# Patient Record
Sex: Male | Born: 1975
Health system: Southern US, Community
[De-identification: ages and names within clinical notes are randomized; demographics above are authoritative.]

## PROBLEM LIST (undated history)

## (undated) DIAGNOSIS — E119 Type 2 diabetes mellitus without complications: Secondary | ICD-10-CM

## (undated) DIAGNOSIS — N200 Calculus of kidney: Secondary | ICD-10-CM

## (undated) DIAGNOSIS — I10 Essential (primary) hypertension: Secondary | ICD-10-CM

## (undated) DIAGNOSIS — N179 Acute kidney failure, unspecified: Secondary | ICD-10-CM

## (undated) HISTORY — DX: Type 2 diabetes mellitus without complications: E11.9

## (undated) HISTORY — PX: WISDOM TOOTH EXTRACTION: SHX21

## (undated) HISTORY — DX: Acute kidney failure, unspecified: N17.9

---

## 2004-03-24 ENCOUNTER — Emergency Department (HOSPITAL_COMMUNITY): Admission: EM | Admit: 2004-03-24 | Discharge: 2004-03-24 | Payer: Self-pay | Admitting: Emergency Medicine

## 2010-02-25 ENCOUNTER — Emergency Department: Payer: Self-pay | Admitting: Emergency Medicine

## 2011-08-11 ENCOUNTER — Emergency Department: Payer: Self-pay | Admitting: Unknown Physician Specialty

## 2011-08-11 LAB — URINALYSIS, COMPLETE
Bilirubin,UR: NEGATIVE
Leukocyte Esterase: NEGATIVE
Nitrite: NEGATIVE
Ph: 7 (ref 4.5–8.0)
Protein: 30
WBC UR: 1 /HPF (ref 0–5)

## 2011-08-12 LAB — COMPREHENSIVE METABOLIC PANEL
Albumin: 4.3 g/dL (ref 3.4–5.0)
Alkaline Phosphatase: 68 U/L (ref 50–136)
Bilirubin,Total: 0.4 mg/dL (ref 0.2–1.0)
Creatinine: 1.06 mg/dL (ref 0.60–1.30)
Glucose: 102 mg/dL — ABNORMAL HIGH (ref 65–99)
Osmolality: 284 (ref 275–301)
Sodium: 142 mmol/L (ref 136–145)

## 2011-08-12 LAB — CBC
HCT: 40 % (ref 40.0–52.0)
HGB: 12.8 g/dL — ABNORMAL LOW (ref 13.0–18.0)
Platelet: 244 10*3/uL (ref 150–440)
RBC: 4.51 10*6/uL (ref 4.40–5.90)
RDW: 14.5 % (ref 11.5–14.5)

## 2012-01-14 ENCOUNTER — Emergency Department: Payer: Self-pay | Admitting: Emergency Medicine

## 2014-05-03 ENCOUNTER — Emergency Department: Payer: Self-pay | Admitting: Emergency Medicine

## 2016-05-03 ENCOUNTER — Emergency Department (HOSPITAL_BASED_OUTPATIENT_CLINIC_OR_DEPARTMENT_OTHER)
Admission: EM | Admit: 2016-05-03 | Discharge: 2016-05-04 | Disposition: A | Discharge status: Home / Self Care | Payer: Self-pay | Attending: Emergency Medicine | Admitting: Emergency Medicine

## 2016-05-03 ENCOUNTER — Encounter (HOSPITAL_BASED_OUTPATIENT_CLINIC_OR_DEPARTMENT_OTHER): Payer: Self-pay | Admitting: Emergency Medicine

## 2016-05-03 DIAGNOSIS — I10 Essential (primary) hypertension: Secondary | ICD-10-CM | POA: Insufficient documentation

## 2016-05-03 HISTORY — DX: Essential (primary) hypertension: I10

## 2016-05-03 NOTE — ED Triage Notes (Signed)
Patient started to have frontal sinus region pain. Chills and cold sweats at home. Family chose to take his BP and it was elevated so his Girlfriend gave him one of her Benicars at home. Patient now feels dizzy and light. Ibprophen, nighttime sinus medication

## 2016-05-03 NOTE — ED Provider Notes (Signed)
[facility] DE[facility] Provider Note   CSN: [id number] Arrival date & time: 05/03/16  2153  By signing my name below, I, Margit Banda, attest that this documentation has been prepared under the direction and in the presence of Veryl Abril, PA-C.  Electronically Signed: Margit Banda, ED Scribe. 05/04/16. 12:13 AM.   History   Chief Complaint Chief Complaint  Patient presents with  . Headache    HPI Jason Bruce is a 41 y.o. male who presents to the Emergency Department complaining of a moderate, bilateral frontal HA since yesterday (05/02/16). Associated sx include lightheadedness and nausea. Pt reports he has had HA's off and on for the last year, especially with stress. Headaches are frontal, bilateral, nonradiating, moderate to severe, and do not seem to be associated with other symptoms, such as nasal congestion.  He has taken Ibuprofen and Advil cold and sinus with no relief. PTA, his girlfriend gave him one of her Benicar (25mg ), which he states seemed to help because he is now pain and complaint free. Bp was taken again in the room at 12:08 AM and has lowered to 175/110. Pt denies visual disturbance, neuro deficit, vomiting, chest pain, shortness of breath, peripheral edema, or any other complaints.  On 12/23/14, pt was seen at [facility] for elevated BP and was told to follow up with a PCP, which he did not do.     The history is provided by the patient, medical records and the spouse. No language interpreter was used.    Past Medical History:  Diagnosis Date  . Hypertension     There are no active problems to display for this patient.   History reviewed. No pertinent surgical history.     Home Medications    Prior to Admission medications   Medication Sig Start Date End Date Taking? Authorizing Provider  hydrochlorothiazide (HYDRODIURIL) 25 MG tablet Take 1 tablet (25 mg total) by mouth daily. 05/04/16   Lorayne Bender, PA-C    Family History History  reviewed. No pertinent family history.  Social History Social History  Substance Use Topics  . Smoking status: Never Smoker  . Smokeless tobacco: Never Used  . Alcohol use No     Allergies   Shellfish allergy   Review of Systems Review of Systems  Eyes: Negative for visual disturbance.  Respiratory: Negative for shortness of breath.   Cardiovascular: Negative for chest pain.  Gastrointestinal: Negative for nausea and vomiting.  Musculoskeletal: Negative for neck pain.  Neurological: Positive for light-headedness and headaches. Negative for syncope, weakness and numbness.  All other systems reviewed and are negative.    Physical Exam Updated Vital Signs BP (!) 181/111   Pulse 81   Temp 98.2 F (36.8 C) (Oral)   Resp 18   Ht 5\' 9"  (1.753 m)   Wt 280 lb (127 kg)   SpO2 99%   BMI 41.35 kg/m   Physical Exam  Constitutional: He is oriented to person, place, and time. He appears well-developed and well-nourished. No distress.  HENT:  Head: Normocephalic and atraumatic.  Mouth/Throat: Oropharynx is clear and moist.  No tenderness over the maxillary or frontal sinuses.  Eyes: Conjunctivae and EOM are normal. Pupils are equal, round, and reactive to light.  Neck: Neck supple.  Cardiovascular: Normal rate, regular rhythm, normal heart sounds and intact distal pulses.   Pulmonary/Chest: Effort normal and breath sounds normal. No respiratory distress.  Abdominal: Soft. There is no tenderness. There is no guarding.  Musculoskeletal: He exhibits  no edema.  Normal motor function intact in all extremities and spine. No midline spinal tenderness.   Lymphadenopathy:    He has no cervical adenopathy.  Neurological: He is alert and oriented to person, place, and time.  No sensory deficits. Strength 5/5 in all extremities. No gait disturbance. Coordination intact including heel to shin and finger to nose. Cranial nerves III-XII grossly intact. No facial droop.   Skin: Skin is warm  and dry. He is not diaphoretic.  Psychiatric: He has a normal mood and affect. His behavior is normal.  Nursing note and vitals reviewed.    ED Treatments / Results  DIAGNOSTIC STUDIES: Oxygen Saturation is 99% on RA, normal by my interpretation.   COORDINATION OF CARE: 12:09 AM-Discussed next steps with pt. Pt verbalized understanding and is agreeable with the plan.    Labs (all labs ordered are listed, but only abnormal results are displayed) Labs Reviewed  BASIC METABOLIC PANEL - Abnormal; Notable for the following:       Result Value   Glucose, Bld 133 (*)    All other components within normal limits    EKG  EKG Interpretation None       Radiology No results found.  Procedures Procedures (including critical care time)  Medications Ordered in ED Medications - No data to display   Initial Impression / Assessment and Plan / ED Course  I have reviewed the triage vital signs and the nursing notes.  Pertinent labs & imaging results that were available during my care of the patient were reviewed by me and considered in my medical decision making (see chart for details).      Patient presents with initial complaint of headache. This resolved following Benicar and a decrease in the patient's blood pressure. He has no neuro or functional deficits. Patient started on a low-dose of hydrochlorothiazide. PCP follow-up. Resources and instructions given. Return precautions discussed. Patient voices understanding of all instructions and is comfortable with discharge.  Findings and plan of care discussed with [name], MD.   Vitals:   05/03/16 2204 05/03/16 2206 05/03/16 2236  BP:  (!) 203/119 (!) 181/111  Pulse:  80 81  Resp:  18   Temp:  98.2 F (36.8 C)   TempSrc:  Oral   SpO2:  100% 99%  Weight: 127 kg    Height: 5\' 9"  (1.753 m)       Final Clinical Impressions(s) / ED Diagnoses   Final diagnoses:  Essential hypertension    New Prescriptions New  Prescriptions   HYDROCHLOROTHIAZIDE (HYDRODIURIL) 25 MG TABLET    Take 1 tablet (25 mg total) by mouth daily.   I personally performed the services described in this documentation, which was scribed in my presence. The recorded information has been reviewed and is accurate.    Lorayne Bender, PA-C 05/04/16 [place], MD 05/04/16 [phone]

## 2016-05-03 NOTE — ED Notes (Signed)
Pt has urine sample in lab

## 2016-05-04 LAB — BASIC METABOLIC PANEL
Anion gap: 8 (ref 5–15)
BUN: 16 mg/dL (ref 6–20)
CALCIUM: 9.2 mg/dL (ref 8.9–10.3)
CHLORIDE: 101 mmol/L (ref 101–111)
CO2: 26 mmol/L (ref 22–32)
CREATININE: 0.96 mg/dL (ref 0.61–1.24)
GFR calc Af Amer: >60 mL/min (ref >60)
GFR calc non Af Amer: >60 mL/min (ref >60)
GLUCOSE: 133 mg/dL — AB (ref 65–99)
Potassium: 3.5 mmol/L (ref 3.5–5.1)
Sodium: 135 mmol/L (ref 135–145)

## 2016-05-04 MED ORDER — HYDROCHLOROTHIAZIDE 25 MG PO TABS: 25.0000 mg | ORAL_TABLET | Freq: Every day | ORAL | 0 refills | Status: DC

## 2016-05-04 NOTE — Discharge Instructions (Signed)
Please begin taking the hydrochlorothiazide tomorrow. You must follow up with a primary care provider as soon as possible on this matter. You may try contacting [facility] to see if they have openings. A case manager consult request has also been made to assist you in securing a PCP. They may contact you next week by phone.

## 2016-05-06 ENCOUNTER — Telehealth: Payer: Self-pay | Admitting: *Deleted

## 2016-05-06 NOTE — Telephone Encounter (Signed)
[facility] noted consult regarding helping pt obtain PCP establishment.  No answer, left message.

## 2017-10-27 ENCOUNTER — Emergency Department (HOSPITAL_BASED_OUTPATIENT_CLINIC_OR_DEPARTMENT_OTHER): Payer: Self-pay

## 2017-10-27 ENCOUNTER — Encounter (HOSPITAL_BASED_OUTPATIENT_CLINIC_OR_DEPARTMENT_OTHER): Payer: Self-pay

## 2017-10-27 ENCOUNTER — Other Ambulatory Visit: Payer: Self-pay

## 2017-10-27 ENCOUNTER — Emergency Department (HOSPITAL_BASED_OUTPATIENT_CLINIC_OR_DEPARTMENT_OTHER)
Admission: EM | Admit: 2017-10-27 | Discharge: 2017-10-27 | Disposition: A | Discharge status: Home / Self Care | Payer: Self-pay | Attending: Emergency Medicine | Admitting: Emergency Medicine

## 2017-10-27 DIAGNOSIS — R51 Headache: Secondary | ICD-10-CM | POA: Insufficient documentation

## 2017-10-27 DIAGNOSIS — H6122 Impacted cerumen, left ear: Secondary | ICD-10-CM | POA: Insufficient documentation

## 2017-10-27 DIAGNOSIS — R519 Headache, unspecified: Secondary | ICD-10-CM

## 2017-10-27 DIAGNOSIS — Z79899 Other long term (current) drug therapy: Secondary | ICD-10-CM | POA: Insufficient documentation

## 2017-10-27 DIAGNOSIS — I10 Essential (primary) hypertension: Secondary | ICD-10-CM | POA: Insufficient documentation

## 2017-10-27 MED ORDER — DIPHENHYDRAMINE HCL 50 MG/ML IJ SOLN
25.0000 mg | Freq: Once | INTRAMUSCULAR | Status: AC
  Administered 2017-10-27: 25 mg via INTRAVENOUS
  Filled 2017-10-27: qty 1

## 2017-10-27 MED ORDER — ACETAMINOPHEN 500 MG PO TABS
1000.0000 mg | ORAL_TABLET | Freq: Once | ORAL | Status: AC
  Administered 2017-10-27: 1000 mg via ORAL
  Filled 2017-10-27: qty 2

## 2017-10-27 MED ORDER — HYDRALAZINE HCL 20 MG/ML IJ SOLN
5.0000 mg | Freq: Once | INTRAMUSCULAR | Status: AC
  Administered 2017-10-27: 5 mg via INTRAVENOUS
  Filled 2017-10-27: qty 1

## 2017-10-27 MED ORDER — METOCLOPRAMIDE HCL 5 MG/ML IJ SOLN
10.0000 mg | Freq: Once | INTRAMUSCULAR | Status: AC
  Administered 2017-10-27: 10 mg via INTRAVENOUS
  Filled 2017-10-27: qty 2

## 2017-10-27 MED ORDER — DOCUSATE SODIUM 50 MG/5ML PO LIQD
50.0000 mg | Freq: Once | ORAL | Status: AC
  Administered 2017-10-27: 50 mg via ORAL
  Filled 2017-10-27: qty 10

## 2017-10-27 MED ORDER — HYDROCHLOROTHIAZIDE 25 MG PO TABS
25.0000 mg | ORAL_TABLET | Freq: Once | ORAL | Status: AC
  Administered 2017-10-27: 25 mg via ORAL
  Filled 2017-10-27: qty 1

## 2017-10-27 MED ORDER — HYDROCHLOROTHIAZIDE 25 MG PO TABS: 25.0000 mg | ORAL_TABLET | Freq: Every day | ORAL | 0 refills | Status: DC

## 2017-10-27 NOTE — ED Provider Notes (Signed)
[facility] EMERGENCY DEPARTMENT Provider Note   CSN: [id number] Arrival date & time: 10/27/17  1628     History   Chief Complaint Chief Complaint  Patient presents with  . Otalgia  . Headache    HPI Jason Bruce is a 42 y.o. male with history of uncontrolled hypertension who presents with a one-week history of left ear pain was intermittent left temporal headache.  Patient continued over-the-counter eardrops, garlic and olive oil, and peroxide in his ear to try to help his symptoms.  He denies any cold symptoms.  Denies any fevers at home.  He does not take blood pressure medication regularly.  He does not have a PCP.  Per chart review, patient was prescribed HCTZ in 2018, however he ran out and did not have it refilled.  Patient take someone else's Benicar very infrequently.  Patient reports decreased sensation to his left temple for the past few days, which he has not experienced before with his headaches.  He denies any chest pain, shortness of breath, nausea, vomiting, abdominal pain, photophobia, phonophobia, dizziness, lightheadedness.  HPI  Past Medical History:  Diagnosis Date  . Hypertension     There are no active problems to display for this patient.   History reviewed. No pertinent surgical history.      Home Medications    Prior to Admission medications   Medication Sig Start Date End Date Taking? Authorizing Provider  hydrochlorothiazide (HYDRODIURIL) 25 MG tablet Take 1 tablet (25 mg total) by mouth daily. 10/27/17   Frederica Kuster, PA-C    Family History No family history on file.  Social History Social History   Tobacco Use  . Smoking status: Never Smoker  . Smokeless tobacco: Never Used  Substance Use Topics  . Alcohol use: No  . Drug use: No     Allergies   Shellfish allergy   Review of Systems Review of Systems  Constitutional: Negative for chills and fever.  HENT: Positive for ear pain. Negative for ear discharge,  facial swelling and sore throat.   Eyes: Negative for photophobia and visual disturbance.  Respiratory: Negative for shortness of breath.   Cardiovascular: Negative for chest pain.  Gastrointestinal: Negative for abdominal pain, nausea and vomiting.  Genitourinary: Negative for dysuria.  Musculoskeletal: Negative for back pain.  Skin: Negative for rash and wound.  Neurological: Positive for numbness and headaches.  Psychiatric/Behavioral: The patient is not nervous/anxious.      Physical Exam Updated Vital Signs BP (!) 174/96 (BP Location: Right Arm)   Pulse 72   Temp 97.9 F (36.6 C) (Oral)   Resp 16   Ht 5\' 9"  (1.753 m)   Wt (!) 150.6 kg   SpO2 99%   BMI 49.03 kg/m   Physical Exam  Constitutional: He appears well-developed and well-nourished. No distress.  HENT:  Head: Normocephalic and atraumatic.  Right Ear: Tympanic membrane normal.  Mouth/Throat: Oropharynx is clear and moist. No oropharyngeal exudate.  Cerumen impaction noted to the left ear canal  Eyes: Pupils are equal, round, and reactive to light. Conjunctivae and EOM are normal. Right eye exhibits no discharge. Left eye exhibits no discharge. No scleral icterus.  Neck: Normal range of motion. Neck supple. No thyromegaly present.  Cardiovascular: Normal rate, regular rhythm, normal heart sounds and intact distal pulses. Exam reveals no gallop and no friction rub.  No murmur heard. Pulmonary/Chest: Effort normal and breath sounds normal. No stridor. No respiratory distress. He has no wheezes. He has no  rales.  Abdominal: Soft. Bowel sounds are normal. He exhibits no distension. There is no tenderness. There is no rebound and no guarding.  Musculoskeletal: He exhibits no edema.  Lymphadenopathy:    He has no cervical adenopathy.  Neurological: He is alert. Coordination normal. GCS eye subscore is 4. GCS verbal subscore is 5. GCS motor subscore is 6.  CN 3-12 intact; normal sensation throughout, except decreased and  paresthesia to the left side of forehead and temporal region; 5/5 strength in all 4 extremities; equal bilateral grip strength; no ataxia on finger-to-nose  Skin: Skin is warm and dry. No rash noted. He is not diaphoretic. No pallor.  Psychiatric: He has a normal mood and affect.  Nursing note and vitals reviewed.    ED Treatments / Results  Labs (all labs ordered are listed, but only abnormal results are displayed) Labs Reviewed - No data to display  EKG None  Radiology Ct Head Wo Contrast  Result Date: 10/27/2017 CLINICAL DATA:  LEFT ear pain for 1 week.  Elevated blood pressure. EXAM: CT HEAD WITHOUT CONTRAST TECHNIQUE: Contiguous axial images were obtained from the base of the skull through the vertex without intravenous contrast. COMPARISON:  05/03/2014. FINDINGS: Brain: No evidence of acute infarction, hemorrhage, hydrocephalus, extra-axial collection or mass lesion/mass effect. Normal cerebral volume. No white matter disease. Vascular: No hyperdense vessel or unexpected calcification. Skull: Normal. Negative for fracture or focal lesion. Sinuses/Orbits: No acute finding. Other: Normal nasopharynx, middle ear cavities and mastoid air cells. IMPRESSION: Negative exam. Electronically Signed   By: Staci Righter M.D.   On: 10/27/2017 17:53    Procedures Procedures (including critical care time)  Medications Ordered in ED Medications  hydrochlorothiazide (HYDRODIURIL) tablet 25 mg (25 mg Oral Given 10/27/17 1800)  docusate (COLACE) 50 MG/5ML liquid 50 mg (50 mg Oral Given 10/27/17 1815)  acetaminophen (TYLENOL) tablet 1,000 mg (1,000 mg Oral Given 10/27/17 1800)  diphenhydrAMINE (BENADRYL) injection 25 mg (25 mg Intravenous Given 10/27/17 1933)  metoCLOPramide (REGLAN) injection 10 mg (10 mg Intravenous Given 10/27/17 1932)  hydrALAZINE (APRESOLINE) injection 5 mg (5 mg Intravenous Given 10/27/17 1946)     Initial Impression / Assessment and Plan / ED Course  I have reviewed the triage  vital signs and the nursing notes.  Pertinent labs & imaging results that were available during my care of the patient were reviewed by me and considered in my medical decision making (see chart for details).     Patient presenting with hypertension, headache, and left ear cerumen impaction.  Cerumen impaction removed in the ED with irrigation and Colace.  Patient's ear pain resolved.  He continued to have left temporal headache and decreased sensation to his left temple which was improved with headache cocktail.  No erythema or noted vessel edema.  CT head is negative.  Patient's blood pressure decreased in the ED with hydrochlorothiazide and hydralazine.  Patient advised to resume taking hydrochlorothiazide and to follow-up with.  I stressed the importance of this and the risks of having untreated hypertension.  Patient understands and agrees with plan.  Patient vitals stable throughout ED course and discharged in satisfactory condition. I discussed patient case with Dr. Darl Householder who guided the patient's management and agrees with plan.   Final Clinical Impressions(s) / ED Diagnoses   Final diagnoses:  Essential hypertension  Bad headache    ED Discharge Orders         Ordered    hydrochlorothiazide (HYDRODIURIL) 25 MG tablet  Daily  10/27/17 2046           Frederica Kuster, PA-C 10/27/17 2344    Drenda Freeze, MD 10/28/17 [phone]

## 2017-10-27 NOTE — ED Triage Notes (Addendum)
C/o left ear, left temporal pain x 1 week-also c/o elevated BP-noncompliant HTN-NAD-steady gait

## 2017-10-27 NOTE — Discharge Instructions (Addendum)
Take hydrochlorothiazide daily.  Please follow-up with a primary care provider for potentially additional medications as needed.  It is important that you take this daily to see its effect and if the second medication is required.  Please return the emergency department if you develop any new or worsening symptoms.

## 2018-07-13 ENCOUNTER — Other Ambulatory Visit: Payer: Self-pay

## 2018-07-13 ENCOUNTER — Emergency Department (HOSPITAL_BASED_OUTPATIENT_CLINIC_OR_DEPARTMENT_OTHER): Payer: 59

## 2018-07-13 ENCOUNTER — Emergency Department (HOSPITAL_BASED_OUTPATIENT_CLINIC_OR_DEPARTMENT_OTHER)
Admission: EM | Admit: 2018-07-13 | Discharge: 2018-07-13 | Disposition: A | Discharge status: Home / Self Care | Payer: 59 | Attending: Emergency Medicine | Admitting: Emergency Medicine

## 2018-07-13 ENCOUNTER — Encounter (HOSPITAL_BASED_OUTPATIENT_CLINIC_OR_DEPARTMENT_OTHER): Payer: Self-pay | Admitting: *Deleted

## 2018-07-13 DIAGNOSIS — Z79899 Other long term (current) drug therapy: Secondary | ICD-10-CM | POA: Diagnosis not present

## 2018-07-13 DIAGNOSIS — I10 Essential (primary) hypertension: Secondary | ICD-10-CM

## 2018-07-13 DIAGNOSIS — R1032 Left lower quadrant pain: Secondary | ICD-10-CM | POA: Diagnosis present

## 2018-07-13 DIAGNOSIS — N210 Calculus in bladder: Secondary | ICD-10-CM | POA: Insufficient documentation

## 2018-07-13 DIAGNOSIS — N201 Calculus of ureter: Secondary | ICD-10-CM

## 2018-07-13 LAB — URINALYSIS, ROUTINE W REFLEX MICROSCOPIC
Bilirubin Urine: NEGATIVE
Glucose, UA: NEGATIVE mg/dL
Ketones, ur: 15 mg/dL — AB
Leukocytes,Ua: NEGATIVE
Nitrite: NEGATIVE
Protein, ur: NEGATIVE mg/dL
Specific Gravity, Urine: 1.02 (ref 1.005–1.030)
pH: 7 (ref 5.0–8.0)

## 2018-07-13 LAB — CBC WITH DIFFERENTIAL/PLATELET
Abs Immature Granulocytes: 0.04 10*3/uL (ref 0.00–0.07)
Basophils Absolute: 0 10*3/uL (ref 0.0–0.1)
Basophils Relative: 0 %
Eosinophils Absolute: 0.1 10*3/uL (ref 0.0–0.5)
Eosinophils Relative: 1 %
HCT: 41.3 % (ref 39.0–52.0)
Hemoglobin: 12.6 g/dL — ABNORMAL LOW (ref 13.0–17.0)
Immature Granulocytes: 0 %
Lymphocytes Relative: 27 %
Lymphs Abs: 2.5 10*3/uL (ref 0.7–4.0)
MCH: 27.2 pg (ref 26.0–34.0)
MCHC: 30.5 g/dL (ref 30.0–36.0)
MCV: 89 fL (ref 80.0–100.0)
Monocytes Absolute: 0.6 10*3/uL (ref 0.1–1.0)
Monocytes Relative: 7 %
Neutro Abs: 6 10*3/uL (ref 1.7–7.7)
Neutrophils Relative %: 65 %
Platelets: 253 10*3/uL (ref 150–400)
RBC: 4.64 MIL/uL (ref 4.22–5.81)
RDW: 14.1 % (ref 11.5–15.5)
WBC: 9.4 10*3/uL (ref 4.0–10.5)
nRBC: 0 % (ref 0.0–0.2)

## 2018-07-13 LAB — COMPREHENSIVE METABOLIC PANEL
ALT: 19 U/L (ref 0–44)
AST: 20 U/L (ref 15–41)
Albumin: 4.4 g/dL (ref 3.5–5.0)
Alkaline Phosphatase: 56 U/L (ref 38–126)
Anion gap: 10 (ref 5–15)
BUN: 17 mg/dL (ref 6–20)
CO2: 25 mmol/L (ref 22–32)
Calcium: 9.1 mg/dL (ref 8.9–10.3)
Chloride: 101 mmol/L (ref 98–111)
Creatinine, Ser: 1.14 mg/dL (ref 0.61–1.24)
GFR calc Af Amer: >60 mL/min (ref >60)
GFR calc non Af Amer: >60 mL/min (ref >60)
Glucose, Bld: 216 mg/dL — ABNORMAL HIGH (ref 70–99)
Potassium: 3.7 mmol/L (ref 3.5–5.1)
Sodium: 136 mmol/L (ref 135–145)
Total Bilirubin: 0.4 mg/dL (ref 0.3–1.2)
Total Protein: 8.5 g/dL — ABNORMAL HIGH (ref 6.5–8.1)

## 2018-07-13 LAB — URINALYSIS, MICROSCOPIC (REFLEX): Squamous Epithelial / LPF: NONE SEEN (ref 0–5)

## 2018-07-13 LAB — LIPASE, BLOOD: Lipase: 46 U/L (ref 11–51)

## 2018-07-13 MED ORDER — SODIUM CHLORIDE 0.9 % IV SOLN
INTRAVENOUS | Status: DC | PRN
  Administered 2018-07-13: 11:00:00 1000 mL via INTRAVENOUS

## 2018-07-13 MED ORDER — TAMSULOSIN HCL 0.4 MG PO CAPS: 0.4000 mg | ORAL_CAPSULE | Freq: Every day | ORAL | 0 refills | Status: DC

## 2018-07-13 MED ORDER — HYDROMORPHONE HCL 1 MG/ML IJ SOLN
1.0000 mg | Freq: Once | INTRAMUSCULAR | Status: AC
  Administered 2018-07-13: 1 mg via INTRAVENOUS
  Filled 2018-07-13: qty 1

## 2018-07-13 MED ORDER — KETOROLAC TROMETHAMINE 30 MG/ML IJ SOLN
30.0000 mg | Freq: Once | INTRAMUSCULAR | Status: AC
  Administered 2018-07-13: 30 mg via INTRAVENOUS
  Filled 2018-07-13: qty 1

## 2018-07-13 MED ORDER — ONDANSETRON HCL 4 MG/2ML IJ SOLN
4.0000 mg | Freq: Once | INTRAMUSCULAR | Status: AC
  Administered 2018-07-13: 4 mg via INTRAVENOUS
  Filled 2018-07-13: qty 2

## 2018-07-13 MED ORDER — OXYCODONE-ACETAMINOPHEN 5-325 MG PO TABS: 1.0000 | ORAL_TABLET | Freq: Four times a day (QID) | ORAL | 0 refills | Status: DC | PRN

## 2018-07-13 MED ORDER — IBUPROFEN 600 MG PO TABS: 600.0000 mg | ORAL_TABLET | Freq: Four times a day (QID) | ORAL | 0 refills | Status: DC | PRN

## 2018-07-13 MED ORDER — HYDROMORPHONE HCL 1 MG/ML IJ SOLN
1.0000 mg | Freq: Once | INTRAMUSCULAR | Status: AC
  Administered 2018-07-13: 14:00:00 1 mg via INTRAVENOUS
  Filled 2018-07-13: qty 1

## 2018-07-13 MED ORDER — CLONIDINE HCL 0.1 MG PO TABS
0.2000 mg | ORAL_TABLET | Freq: Once | ORAL | Status: AC
  Administered 2018-07-13: 14:00:00 0.2 mg via ORAL
  Filled 2018-07-13: qty 2

## 2018-07-13 NOTE — ED Notes (Signed)
ED Provider at bedside. 

## 2018-07-13 NOTE — ED Provider Notes (Signed)
[facility] EMERGENCY DEPARTMENT Provider Note   CSN: [id number] Arrival date & time: 07/13/18  1104    History   Chief Complaint Chief Complaint  Patient presents with  . Abdominal Pain    HPI Jason Bruce is a 43 y.o. male.     HPI Patient presents with left lower quadrant abdominal pain which started this morning.  Associated with nausea.  States similar to pain from passing kidney stones.  No fever or chills.  Patient states he did not take his blood pressure medication this morning.  Denies urinary symptoms including dysuria, frequency, urgency or hematuria. Past Medical History:  Diagnosis Date  . Hypertension     There are no active problems to display for this patient.   History reviewed. No pertinent surgical history.      Home Medications    Prior to Admission medications   Medication Sig Start Date End Date Taking? Authorizing Provider  hydrochlorothiazide (HYDRODIURIL) 25 MG tablet Take 1 tablet (25 mg total) by mouth daily. 10/27/17  Yes Law, Bea Graff, PA-C  ibuprofen (ADVIL) 600 MG tablet Take 1 tablet (600 mg total) by mouth every 6 (six) hours as needed. 07/13/18   Julianne Rice, MD  oxyCODONE-acetaminophen (PERCOCET) 5-325 MG tablet Take 1-2 tablets by mouth every 6 (six) hours as needed for severe pain. 07/13/18   Julianne Rice, MD  tamsulosin (FLOMAX) 0.4 MG CAPS capsule Take 1 capsule (0.4 mg total) by mouth daily. 07/13/18   Julianne Rice, MD    Family History No family history on file.  Social History Social History   Tobacco Use  . Smoking status: Never Smoker  . Smokeless tobacco: Never Used  Substance Use Topics  . Alcohol use: No  . Drug use: No     Allergies   Shellfish allergy   Review of Systems Review of Systems  Constitutional: Negative for chills and fever.  HENT: Negative for sore throat and trouble swallowing.   Respiratory: Negative for cough and shortness of breath.   Cardiovascular: Negative for  chest pain and leg swelling.  Gastrointestinal: Positive for abdominal pain and nausea. Negative for constipation, diarrhea and vomiting.  Genitourinary: Negative for dysuria, flank pain, frequency, hematuria and penile pain.  Musculoskeletal: Negative for back pain, myalgias and neck pain.  Skin: Negative for rash and wound.  Neurological: Negative for dizziness, weakness, light-headedness, numbness and headaches.  All other systems reviewed and are negative.    Physical Exam Updated Vital Signs BP (!) 194/113 (BP Location: Right Arm)   Pulse 86   Temp 98 F (36.7 C) (Oral)   Resp 16   Ht 5\' 9"  (1.753 m)   Wt (!) 150 kg   SpO2 95%   BMI 48.83 kg/m   Physical Exam Vitals signs and nursing note reviewed.  Constitutional:      Appearance: He is well-developed.     Comments: Appears uncomfortable  HENT:     Head: Normocephalic and atraumatic.     Nose: Nose normal.  Eyes:     Pupils: Pupils are equal, round, and reactive to light.  Neck:     Musculoskeletal: Normal range of motion and neck supple.  Cardiovascular:     Rate and Rhythm: Normal rate and regular rhythm.     Heart sounds: No murmur. No friction rub. No gallop.   Pulmonary:     Effort: Pulmonary effort is normal.     Breath sounds: Normal breath sounds.  Abdominal:     General:  Bowel sounds are normal. There is no distension.     Palpations: Abdomen is soft.     Tenderness: There is no abdominal tenderness. There is no right CVA tenderness, left CVA tenderness, guarding or rebound.  Musculoskeletal: Normal range of motion.        General: No swelling, tenderness, deformity or signs of injury.     Right lower leg: No edema.     Left lower leg: No edema.  Skin:    General: Skin is warm and dry.     Findings: No erythema or rash.  Neurological:     Mental Status: He is alert and oriented to person, place, and time.  Psychiatric:        Behavior: Behavior normal.      ED Treatments / Results  Labs  (all labs ordered are listed, but only abnormal results are displayed) Labs Reviewed  URINALYSIS, ROUTINE W REFLEX MICROSCOPIC - Abnormal; Notable for the following components:      Result Value   Hgb urine dipstick MODERATE (*)    Ketones, ur 15 (*)    All other components within normal limits  CBC WITH DIFFERENTIAL/PLATELET - Abnormal; Notable for the following components:   Hemoglobin 12.6 (*)    All other components within normal limits  COMPREHENSIVE METABOLIC PANEL - Abnormal; Notable for the following components:   Glucose, Bld 216 (*)    Total Protein 8.5 (*)    All other components within normal limits  URINALYSIS, MICROSCOPIC (REFLEX) - Abnormal; Notable for the following components:   Bacteria, UA RARE (*)    All other components within normal limits  LIPASE, BLOOD    EKG None  Radiology Ct Renal Stone Study  Result Date: 07/13/2018 CLINICAL DATA:  Left flank, left lower quadrant pain, nausea, history of stones EXAM: CT ABDOMEN AND PELVIS WITHOUT CONTRAST TECHNIQUE: Multidetector CT imaging of the abdomen and pelvis was performed following the standard protocol without IV contrast. COMPARISON:  04/12/2018 FINDINGS: Lower chest: No acute abnormality. Hepatobiliary: No focal liver abnormality is seen. Hepatic steatosis. No gallstones, gallbladder wall thickening, or biliary dilatation. Pancreas: Unremarkable. No pancreatic ductal dilatation or surrounding inflammatory changes. Spleen: Normal in size without focal abnormality. Adrenals/Urinary Tract: Adrenal glands are unremarkable. There is a 2 mm calculus at the most distal left ureter (series 3, image 79) with mild left hydronephrosis and hydroureter. There are multiple additional nonobstructive calculi in the inferior pole of the left kidney. There is a 2 mm calculus in the right aspect of the dependent right urinary bladder (series, image 81). No evidence of right renal or ureteral calculus. Stomach/Bowel: Stomach is within  normal limits. Appendix appears normal. No evidence of bowel wall thickening, distention, or inflammatory changes. Vascular/Lymphatic: No significant vascular findings are present. No enlarged abdominal or pelvic lymph nodes. Reproductive: No mass or other abnormality. Other: No abdominal wall hernia or abnormality. No abdominopelvic ascites. Musculoskeletal: No acute or significant osseous findings. IMPRESSION: 1. There is a 2 mm calculus at the most distal left ureter (series 3, image 79) with mild left hydronephrosis and hydroureter. There are multiple additional nonobstructive calculi in the inferior pole of the left kidney. There is a 2 mm calculus in the right aspect of the dependent right urinary bladder (series, image 81). No evidence of right renal or ureteral calculus. 2.  Hepatic steatosis. Electronically Signed   By: Eddie Candle M.D.   On: 07/13/2018 11:59    Procedures Procedures (including critical care time)  Medications Ordered  in ED Medications  0.9 %  sodium chloride infusion ( Intravenous Stopped 07/13/18 1335)  HYDROmorphone (DILAUDID) injection 1 mg (1 mg Intravenous Given 07/13/18 1126)  ketorolac (TORADOL) 30 MG/ML injection 30 mg (30 mg Intravenous Given 07/13/18 1126)  ondansetron (ZOFRAN) injection 4 mg (4 mg Intravenous Given 07/13/18 1126)  cloNIDine (CATAPRES) tablet 0.2 mg (0.2 mg Oral Given 07/13/18 1335)  HYDROmorphone (DILAUDID) injection 1 mg (1 mg Intravenous Given 07/13/18 1342)     Initial Impression / Assessment and Plan / ED Course  I have reviewed the triage vital signs and the nursing notes.  Pertinent labs & imaging results that were available during my care of the patient were reviewed by me and considered in my medical decision making (see chart for details).       Blood pressures improved after pain medication and dose of clonidine.  No evidence of endorgan damage.  Patient is passing a small left-sided kidney stone.  His symptoms are significantly improved.   Will discharge home with pain medication and Flomax.  He has been advised to follow-up closely with his primary physician regarding blood pressure management.  Return precautions given.   Final Clinical Impressions(s) / ED Diagnoses   Final diagnoses:  Left ureteral stone  Hypertension, unspecified type    ED Discharge Orders         Ordered    oxyCODONE-acetaminophen (PERCOCET) 5-325 MG tablet  Every 6 hours PRN     07/13/18 1409    ibuprofen (ADVIL) 600 MG tablet  Every 6 hours PRN     07/13/18 1409    tamsulosin (FLOMAX) 0.4 MG CAPS capsule  Daily     07/13/18 1409           Julianne Rice, MD 07/13/18 1410

## 2018-07-13 NOTE — ED Triage Notes (Signed)
He woke yesterday with pain in his left lower quadrant without radiation. This am his pain was much worse. He is diaphoretic.

## 2018-11-03 ENCOUNTER — Encounter (HOSPITAL_BASED_OUTPATIENT_CLINIC_OR_DEPARTMENT_OTHER): Payer: Self-pay | Admitting: Adult Health

## 2018-11-03 ENCOUNTER — Emergency Department (HOSPITAL_BASED_OUTPATIENT_CLINIC_OR_DEPARTMENT_OTHER): Payer: Self-pay

## 2018-11-03 ENCOUNTER — Emergency Department (HOSPITAL_BASED_OUTPATIENT_CLINIC_OR_DEPARTMENT_OTHER)
Admission: EM | Admit: 2018-11-03 | Discharge: 2018-11-03 | Disposition: A | Discharge status: Home / Self Care | Payer: Self-pay | Attending: Emergency Medicine | Admitting: Emergency Medicine

## 2018-11-03 ENCOUNTER — Other Ambulatory Visit: Payer: Self-pay

## 2018-11-03 DIAGNOSIS — N200 Calculus of kidney: Secondary | ICD-10-CM | POA: Diagnosis not present

## 2018-11-03 DIAGNOSIS — N202 Calculus of kidney with calculus of ureter: Secondary | ICD-10-CM | POA: Insufficient documentation

## 2018-11-03 DIAGNOSIS — I10 Essential (primary) hypertension: Secondary | ICD-10-CM | POA: Insufficient documentation

## 2018-11-03 HISTORY — DX: Calculus of kidney: N20.0

## 2018-11-03 LAB — BASIC METABOLIC PANEL
Anion gap: 12 (ref 5–15)
BUN: 18 mg/dL (ref 6–20)
CO2: 25 mmol/L (ref 22–32)
Calcium: 9.4 mg/dL (ref 8.9–10.3)
Chloride: 100 mmol/L (ref 98–111)
Creatinine, Ser: 1.16 mg/dL (ref 0.61–1.24)
GFR calc Af Amer: >60 mL/min (ref >60)
GFR calc non Af Amer: >60 mL/min (ref >60)
Glucose, Bld: 130 mg/dL — ABNORMAL HIGH (ref 70–99)
Potassium: 3.5 mmol/L (ref 3.5–5.1)
Sodium: 137 mmol/L (ref 135–145)

## 2018-11-03 LAB — CBC
HCT: 40.7 % (ref 39.0–52.0)
Hemoglobin: 12.8 g/dL — ABNORMAL LOW (ref 13.0–17.0)
MCH: 27.8 pg (ref 26.0–34.0)
MCHC: 31.4 g/dL (ref 30.0–36.0)
MCV: 88.3 fL (ref 80.0–100.0)
Platelets: 265 10*3/uL (ref 150–400)
RBC: 4.61 MIL/uL (ref 4.22–5.81)
RDW: 13.8 % (ref 11.5–15.5)
WBC: 8.1 10*3/uL (ref 4.0–10.5)
nRBC: 0 % (ref 0.0–0.2)

## 2018-11-03 LAB — URINALYSIS, MICROSCOPIC (REFLEX)

## 2018-11-03 LAB — URINALYSIS, ROUTINE W REFLEX MICROSCOPIC
Bilirubin Urine: NEGATIVE
Glucose, UA: NEGATIVE mg/dL
Ketones, ur: 15 mg/dL — AB
Leukocytes,Ua: NEGATIVE
Nitrite: NEGATIVE
Protein, ur: NEGATIVE mg/dL
Specific Gravity, Urine: >1.03 — ABNORMAL HIGH (ref 1.005–1.030)
pH: 5.5 (ref 5.0–8.0)

## 2018-11-03 MED ORDER — ONDANSETRON 4 MG PO TBDP: 4.0000 mg | ORAL_TABLET | Freq: Three times a day (TID) | ORAL | 0 refills | Status: DC | PRN

## 2018-11-03 MED ORDER — ONDANSETRON HCL 4 MG/2ML IJ SOLN
4.0000 mg | Freq: Once | INTRAMUSCULAR | Status: AC
  Administered 2018-11-03: 4 mg via INTRAVENOUS
  Filled 2018-11-03: qty 2

## 2018-11-03 MED ORDER — TAMSULOSIN HCL 0.4 MG PO CAPS: 0.4000 mg | ORAL_CAPSULE | Freq: Every day | ORAL | 0 refills | Status: AC

## 2018-11-03 MED ORDER — HYDROMORPHONE HCL 1 MG/ML IJ SOLN
0.5000 mg | Freq: Once | INTRAMUSCULAR | Status: AC
  Administered 2018-11-03: 21:00:00 0.5 mg via INTRAVENOUS
  Filled 2018-11-03: qty 1

## 2018-11-03 MED ORDER — KETOROLAC TROMETHAMINE 30 MG/ML IJ SOLN
30.0000 mg | Freq: Once | INTRAMUSCULAR | Status: AC
  Administered 2018-11-03: 20:00:00 30 mg via INTRAVENOUS
  Filled 2018-11-03: qty 1

## 2018-11-03 MED ORDER — HYDROMORPHONE HCL 1 MG/ML IJ SOLN
0.5000 mg | Freq: Once | INTRAMUSCULAR | Status: AC
  Administered 2018-11-03: 22:00:00 0.5 mg via INTRAVENOUS
  Filled 2018-11-03: qty 1

## 2018-11-03 MED ORDER — OXYCODONE-ACETAMINOPHEN 5-325 MG PO TABS: 1.0000 | ORAL_TABLET | Freq: Three times a day (TID) | ORAL | 0 refills | Status: DC | PRN

## 2018-11-03 MED ORDER — KETOROLAC TROMETHAMINE 30 MG/ML IJ SOLN
30.0000 mg | Freq: Once | INTRAMUSCULAR | Status: AC
  Administered 2018-11-03: 30 mg via INTRAVENOUS
  Filled 2018-11-03: qty 1

## 2018-11-03 NOTE — ED Notes (Signed)
ED Provider at bedside. 

## 2018-11-03 NOTE — ED Provider Notes (Signed)
[place] [facility] EMERGENCY DEPARTMENT Provider Note   CSN: [id number] Arrival date & time: 11/03/18  1715     History   Chief Complaint Chief Complaint  Patient presents with   Nephrolithiasis    HPI Jason Bruce is a 43 y.o. male has no history of hypertension, kidney stones who presents for evaluation of left flank pain that began today.  He reports that initially, pain was in the left flank but states then has radiated to his left lower abdomen.  He states he took ibuprofen with minimal improvement in symptoms so he came to the emergency department.  He states he has a history of kidney stones and his most recent one was on 10/17/2018.  At the time, he was given Keflex, ibuprofen and Zofran.  He states that this feels similar to his previous episodes of kidney stones.  Pain is currently 4/10.  He denies any nausea/vomiting, dysuria, hematuria.  He has not noted any fevers.     The history is provided by the patient.    Past Medical History:  Diagnosis Date   Hypertension    Nephrolithiasis     There are no active problems to display for this patient.   History reviewed. No pertinent surgical history.      Home Medications    Prior to Admission medications   Medication Sig Start Date End Date Taking? Authorizing Provider  hydrochlorothiazide (HYDRODIURIL) 25 MG tablet Take 1 tablet (25 mg total) by mouth daily. 10/27/17   Law, Bea Graff, PA-C  ibuprofen (ADVIL) 600 MG tablet Take 1 tablet (600 mg total) by mouth every 6 (six) hours as needed. 07/13/18   Julianne Rice, MD  ondansetron (ZOFRAN ODT) 4 MG disintegrating tablet Take 1 tablet (4 mg total) by mouth every 8 (eight) hours as needed for nausea or vomiting. 11/03/18   Volanda Napoleon, PA-C  oxyCODONE-acetaminophen (PERCOCET/ROXICET) 5-325 MG tablet Take 1-2 tablets by mouth every 8 (eight) hours as needed for severe pain. 11/03/18   Volanda Napoleon, PA-C  tamsulosin (FLOMAX) 0.4 MG CAPS capsule Take 1  capsule (0.4 mg total) by mouth daily for 5 days. 11/03/18 11/08/18  Volanda Napoleon, PA-C    Family History History reviewed. No pertinent family history.  Social History Social History   Tobacco Use   Smoking status: Never Smoker   Smokeless tobacco: Never Used  Substance Use Topics   Alcohol use: No   Drug use: No     Allergies   Shellfish allergy   Review of Systems Review of Systems  Constitutional: Negative for fever.  Gastrointestinal: Negative for vomiting.  Genitourinary: Positive for flank pain. Negative for penile pain, penile swelling, scrotal swelling and testicular pain.  All other systems reviewed and are negative.    Physical Exam Updated Vital Signs BP (!) 180/92 (BP Location: Right Arm)    Pulse 85    Temp 98.5 F (36.9 C) (Oral)    Resp 16    Ht 5\' 9"  (1.753 m)    Wt (!) 158.8 kg    SpO2 99%    BMI 51.69 kg/m   Physical Exam Vitals signs and nursing note reviewed.  Constitutional:      Appearance: Normal appearance. He is well-developed.  HENT:     Head: Normocephalic and atraumatic.  Eyes:     General: Lids are normal. No scleral icterus.       Right eye: No discharge.        Left eye: No  discharge.     Conjunctiva/sclera: Conjunctivae normal.     Pupils: Pupils are equal, round, and reactive to light.  Neck:     Musculoskeletal: Full passive range of motion without pain.  Cardiovascular:     Rate and Rhythm: Normal rate and regular rhythm.     Pulses: Normal pulses.     Heart sounds: Normal heart sounds. No murmur. No friction rub. No gallop.   Pulmonary:     Effort: Pulmonary effort is normal.     Breath sounds: Normal breath sounds.  Abdominal:     Palpations: Abdomen is soft. Abdomen is not rigid.     Tenderness: There is abdominal tenderness in the left lower quadrant. There is left CVA tenderness. There is no guarding.     Comments: Left sided CVA tenderness noted.  Mild tenderness noted to left lower quadrant abdomen.  No  rigidity, guarding.  Musculoskeletal: Normal range of motion.  Skin:    General: Skin is warm and dry.     Capillary Refill: Capillary refill takes less than 2 seconds.  Neurological:     Mental Status: He is alert and oriented to person, place, and time.  Psychiatric:        Speech: Speech normal.        Behavior: Behavior normal.      ED Treatments / Results  Labs (all labs ordered are listed, but only abnormal results are displayed) Labs Reviewed  URINALYSIS, ROUTINE W REFLEX MICROSCOPIC - Abnormal; Notable for the following components:      Result Value   Specific Gravity, Urine >1.030 (*)    Hgb urine dipstick MODERATE (*)    Ketones, ur 15 (*)    All other components within normal limits  CBC - Abnormal; Notable for the following components:   Hemoglobin 12.8 (*)    All other components within normal limits  BASIC METABOLIC PANEL - Abnormal; Notable for the following components:   Glucose, Bld 130 (*)    All other components within normal limits  URINALYSIS, MICROSCOPIC (REFLEX) - Abnormal; Notable for the following components:   Bacteria, UA FEW (*)    All other components within normal limits    EKG None  Radiology Ct Renal Stone Study  Addendum Date: 11/03/2018   ADDENDUM REPORT: 11/03/2018 18:04 ADDENDUM: Suspect a degree of cystitis. Electronically Signed   By: Lowella Grip III M.D.   On: 11/03/2018 18:04   Result Date: 11/03/2018 CLINICAL DATA:  Left flank pain EXAM: CT ABDOMEN AND PELVIS WITHOUT CONTRAST TECHNIQUE: Multidetector CT imaging of the abdomen and pelvis was performed following the standard protocol without oral or IV contrast. COMPARISON:  July 13, 2018 FINDINGS: Lower chest: Lung bases are clear. Hepatobiliary: No focal liver lesions are evident on this noncontrast enhanced study. Gallbladder wall is not appreciably thickened. There is no appreciable biliary duct dilatation. Pancreas: There is no pancreatic mass or inflammatory focus. Spleen:  No splenic lesions are evident. Adrenals/Urinary Tract: Adrenals bilaterally appear normal. There is no evident renal mass on either side. There is mild hydronephrosis on the left. There is no appreciable hydronephrosis on the right. On the left, there is a staghorn type calculus in the lower pole region measuring 2.2 x 1.4 cm. There is a nearby calculus in the lower pole left kidney which is immediately adjacent to the staghorn calculus measuring 7 x 5 mm. Several smaller calculi are seen immediately adjacent to the staghorn calculus and are essentially inseparable from this larger calculus. No  intrarenal calculi evident on the right. There is a 3 x 3 mm calculus in the distal left ureter near the ureterovesical junction. No other ureteral calculi are evident. Urinary bladder is nearly completely collapsed. The urinary bladder wall appears mildly thickened even with nearly collapsed bladder. Stomach/Bowel: There is no appreciable bowel wall or mesenteric thickening. There is no evident bowel obstruction. The terminal ileum appears unremarkable. No evident free air or portal venous air. Vascular/Lymphatic: There is no abdominal aortic aneurysm. No vascular lesions are evident on this noncontrast enhanced study. There is no adenopathy in the abdomen or pelvis. Reproductive: Prostate and seminal vesicles are normal in size and contour. No pelvic mass evident. Other: There is no abscess or ascites in the abdomen or pelvis. Appendix appears normal. Musculoskeletal: There are no blastic or lytic bone lesions. There is no intramuscular or abdominal wall lesions. IMPRESSION: 1. 3 x 3 mm calculus in the distal left ureter near the ureterovesical junction causing mild hydronephrosis and ureterectasis on the left. 2. Staghorn type calculus in the lower pole left kidney measuring 2.2 x 1.4 cm. Several smaller calculi nearby, largest of these measuring 7 x 5 mm. 3. No bowel obstruction. No abscess in the abdomen or pelvis.  Appendix appears normal. Electronically Signed: By: Lowella Grip III M.D. On: 11/03/2018 18:00    Procedures Procedures (including critical care time)  Medications Ordered in ED Medications  HYDROmorphone (DILAUDID) injection 0.5 mg (has no administration in time range)  ondansetron (ZOFRAN) injection 4 mg (has no administration in time range)  ketorolac (TORADOL) 30 MG/ML injection 30 mg (30 mg Intravenous Given 11/03/18 1946)  ketorolac (TORADOL) 30 MG/ML injection 30 mg (30 mg Intravenous Given 11/03/18 2104)  HYDROmorphone (DILAUDID) injection 0.5 mg (0.5 mg Intravenous Given 11/03/18 2104)     Initial Impression / Assessment and Plan / ED Course  I have reviewed the triage vital signs and the nursing notes.  Pertinent labs & imaging results that were available during my care of the patient were reviewed by me and considered in my medical decision making (see chart for details).        43 year old male who presents for evaluation of left flank pain that began acutely this afternoon.  History of kidney stones states this feels similar.  No nausea/vomiting, dysuria, hematuria.  Last kidney stone was 10/17/2018.  On exam, he appears uncomfortable but no acute distress.  He is afebrile.  He is slightly hypertensive.  Suspect is likely secondary to pain.  Vitals otherwise stable.  On exam, he has left-sided CVA tenderness as well as some left lower quadrant abdominal tenderness.  Plan to check labs, urine.  UA shows moderate hemoglobin.  No evidence of infectious etiology.  BMP shows stable BUN and creatinine.  CBC shows no leukocytosis.  Hemoglobin stable at 12.8.  CT scan shows multiple renal calculus.  He has a staghorn calculus noted.  He has a 3 x 3 cm stone in the distal left ureter near the UVJ.  He has some additional mild left-sided hydronephrosis.  Reevaluation.  Patient reports he still having pain.  Will give additional analgesics and reassess.  Reevaluation after  additional analgesics.  Patient reports improvement in pain.  He is resting comfortably in bed.  Repeat abdominal exam shows improvement in tenderness.  Patient states he is comfortable to go home. At this time, patient exhibits no emergent life-threatening condition that require further evaluation in ED or admission.  Plan for short course of pain medication and  follow-up with urology.  Patient had ample opportunity for questions and discussion. All patient's questions were answered with full understanding. Strict return precautions discussed. Patient expresses understanding and agreement to plan.   Portions of this note were generated with [employer]. Dictation errors may occur despite best attempts at proofreading.  Final Clinical Impressions(s) / ED Diagnoses   Final diagnoses:  Kidney stone    ED Discharge Orders         Ordered    tamsulosin (FLOMAX) 0.4 MG CAPS capsule  Daily     11/03/18 2208    ondansetron (ZOFRAN ODT) 4 MG disintegrating tablet  Every 8 hours PRN     11/03/18 2208    oxyCODONE-acetaminophen (PERCOCET/ROXICET) 5-325 MG tablet  Every 8 hours PRN     11/03/18 2208           Volanda Napoleon, PA-C 11/03/18 2225    Fredia Sorrow, MD 11/08/18 [phone]

## 2018-11-03 NOTE — ED Notes (Signed)
EDP made aware of v/s. Pt states he has not taken his HTN meds today. EDP okay with discharging pt at this time.

## 2018-11-03 NOTE — Discharge Instructions (Signed)
You can take Tylenol or Ibuprofen as directed for pain. You can alternate Tylenol and Ibuprofen every 4 hours. If you take Tylenol at 1pm, then you can take Ibuprofen at 5pm. Then you can take Tylenol again at 9pm.   Take pain medications as directed for break through pain. Do not drive or operate machinery while taking this medication.   Take zofran for nausea. Take Flomax.  As we discussed, you will need to follow-up with urology.  I provided outpatient urology referral for further evaluation.  Return the emergency department any worsening pain, fever, vomiting or any other worsening or concerning symptoms.

## 2018-11-03 NOTE — ED Triage Notes (Signed)
PResents with Hx of nephrolithiasis, he states his left flank and lower abdomen began hurting this morning, he took an ibuprofen and decided to come here. This feels the same as previous kidney stones. Pain rated is 4/10 right now.

## 2018-11-25 ENCOUNTER — Other Ambulatory Visit: Payer: Self-pay

## 2018-11-25 ENCOUNTER — Encounter (HOSPITAL_BASED_OUTPATIENT_CLINIC_OR_DEPARTMENT_OTHER): Payer: Self-pay

## 2018-11-25 ENCOUNTER — Emergency Department (HOSPITAL_BASED_OUTPATIENT_CLINIC_OR_DEPARTMENT_OTHER)
Admission: EM | Admit: 2018-11-25 | Discharge: 2018-11-25 | Disposition: A | Discharge status: Home / Self Care | Payer: Self-pay | Attending: Emergency Medicine | Admitting: Emergency Medicine

## 2018-11-25 DIAGNOSIS — R109 Unspecified abdominal pain: Secondary | ICD-10-CM

## 2018-11-25 DIAGNOSIS — R310 Gross hematuria: Secondary | ICD-10-CM | POA: Insufficient documentation

## 2018-11-25 DIAGNOSIS — I10 Essential (primary) hypertension: Secondary | ICD-10-CM | POA: Insufficient documentation

## 2018-11-25 DIAGNOSIS — Z79899 Other long term (current) drug therapy: Secondary | ICD-10-CM | POA: Insufficient documentation

## 2018-11-25 DIAGNOSIS — R1032 Left lower quadrant pain: Secondary | ICD-10-CM | POA: Insufficient documentation

## 2018-11-25 LAB — BASIC METABOLIC PANEL
Anion gap: 10 (ref 5–15)
BUN: 19 mg/dL (ref 6–20)
CO2: 28 mmol/L (ref 22–32)
Calcium: 9.2 mg/dL (ref 8.9–10.3)
Chloride: 100 mmol/L (ref 98–111)
Creatinine, Ser: 1.05 mg/dL (ref 0.61–1.24)
GFR calc Af Amer: >60 mL/min (ref >60)
GFR calc non Af Amer: >60 mL/min (ref >60)
Glucose, Bld: 162 mg/dL — ABNORMAL HIGH (ref 70–99)
Potassium: 3.4 mmol/L — ABNORMAL LOW (ref 3.5–5.1)
Sodium: 138 mmol/L (ref 135–145)

## 2018-11-25 LAB — URINALYSIS, MICROSCOPIC (REFLEX): RBC / HPF: >50 RBC/hpf (ref 0–5)

## 2018-11-25 LAB — URINALYSIS, ROUTINE W REFLEX MICROSCOPIC
Bilirubin Urine: NEGATIVE
Glucose, UA: NEGATIVE mg/dL
Ketones, ur: NEGATIVE mg/dL
Leukocytes,Ua: NEGATIVE
Nitrite: NEGATIVE
Protein, ur: NEGATIVE mg/dL
Specific Gravity, Urine: 1.025 (ref 1.005–1.030)
pH: 6.5 (ref 5.0–8.0)

## 2018-11-25 MED ORDER — IBUPROFEN 600 MG PO TABS: 600.0000 mg | ORAL_TABLET | Freq: Four times a day (QID) | ORAL | 0 refills | Status: DC | PRN

## 2018-11-25 MED ORDER — TAMSULOSIN HCL 0.4 MG PO CAPS: 0.4000 mg | ORAL_CAPSULE | Freq: Every day | ORAL | 0 refills | Status: DC

## 2018-11-25 NOTE — ED Triage Notes (Signed)
Pt c/o LLQ pain and dar urine x 2 days-reports recent kidney stone-NAD-steady gait

## 2018-11-25 NOTE — ED Provider Notes (Signed)
[facility] EMERGENCY DEPARTMENT Provider Note   CSN: [id number] Arrival date & time: 11/25/18  1230     History   Chief Complaint Chief Complaint  Patient presents with  . Abdominal Pain    HPI Jason Bruce is a 43 y.o. male.     Patient with history of known kidney stones presents the emergency department today with complaint of left sided flank pain ongoing over the past 1 to 2 days.  Pain is mild at the current time, 1-2/10.  No associated vomiting.  Patient noted blood in his urine today prompting emergency department visit.  He states that the appearance of blood scared him.  No fevers.  No increased frequency urgency.  CT in September showed several large stones inside of the left kidney as well as a distal ureteral stone.  Patient has not followed up with urology because he did not have $250 to see them.  The onset of this condition was acute. The course is constant. Aggravating factors: none. Alleviating factors: none.  Patient states that he is getting married tomorrow.       Past Medical History:  Diagnosis Date  . Hypertension   . Nephrolithiasis     There are no active problems to display for this patient.   History reviewed. No pertinent surgical history.      Home Medications    Prior to Admission medications   Medication Sig Start Date End Date Taking? Authorizing Provider  hydrochlorothiazide (HYDRODIURIL) 25 MG tablet Take 1 tablet (25 mg total) by mouth daily. 10/27/17   Law, Bea Graff, PA-C  ibuprofen (ADVIL) 600 MG tablet Take 1 tablet (600 mg total) by mouth every 6 (six) hours as needed. 07/13/18   Julianne Rice, MD  ondansetron (ZOFRAN ODT) 4 MG disintegrating tablet Take 1 tablet (4 mg total) by mouth every 8 (eight) hours as needed for nausea or vomiting. 11/03/18   Volanda Napoleon, PA-C  oxyCODONE-acetaminophen (PERCOCET/ROXICET) 5-325 MG tablet Take 1-2 tablets by mouth every 8 (eight) hours as needed for severe pain. 11/03/18    Volanda Napoleon, PA-C    Family History No family history on file.  Social History Social History   Tobacco Use  . Smoking status: Never Smoker  . Smokeless tobacco: Never Used  Substance Use Topics  . Alcohol use: No  . Drug use: No     Allergies   Shellfish allergy   Review of Systems Review of Systems  Constitutional: Negative for fever.  HENT: Negative for rhinorrhea and sore throat.   Eyes: Negative for redness.  Respiratory: Negative for cough.   Cardiovascular: Negative for chest pain.  Gastrointestinal: Negative for abdominal pain, diarrhea, nausea and vomiting.  Genitourinary: Positive for flank pain and hematuria. Negative for dysuria.  Musculoskeletal: Negative for myalgias.  Skin: Negative for rash.  Neurological: Negative for headaches.     Physical Exam Updated Vital Signs BP (!) 165/109 (BP Location: Left Arm)   Pulse 95   Temp 98.3 F (36.8 C) (Oral)   Resp 20   Ht 5\' 9"  (1.753 m)   Wt (!) 153.8 kg   SpO2 98%   BMI 50.06 kg/m   Physical Exam Vitals signs and nursing note reviewed.  Constitutional:      Appearance: He is well-developed.  HENT:     Head: Normocephalic and atraumatic.  Eyes:     General:        Right eye: No discharge.  Left eye: No discharge.     Conjunctiva/sclera: Conjunctivae normal.  Neck:     Musculoskeletal: Normal range of motion and neck supple.  Cardiovascular:     Rate and Rhythm: Normal rate and regular rhythm.     Heart sounds: Normal heart sounds.  Pulmonary:     Effort: Pulmonary effort is normal.     Breath sounds: Normal breath sounds.  Abdominal:     Palpations: Abdomen is soft.     Tenderness: There is abdominal tenderness (mild) in the left lower quadrant.  Skin:    General: Skin is warm and dry.  Neurological:     Mental Status: He is alert.      ED Treatments / Results  Labs (all labs ordered are listed, but only abnormal results are displayed) Labs Reviewed  URINALYSIS,  [place] PANEL    EKG None  Radiology No results found.  Procedures Procedures (including critical care time)  Medications Ordered in ED Medications - No data to display   Initial Impression / Assessment and Plan / ED Course  I have reviewed the triage vital signs and the nursing notes.  Pertinent labs & imaging results that were available during my care of the patient were reviewed by me and considered in my medical decision making (see chart for details).        Patient seen and examined.  Patient looks good now.  Will check UA and kidney function.  If symptoms are controlled he will be discharged home on scheduled NSAIDs, continued hydration, Flomax.  Vital signs reviewed and are as follows: BP (!) 165/109 (BP Location: Left Arm)   Pulse 95   Temp 98.3 F (36.8 C) (Oral)   Resp 20   Ht 5\' 9"  (1.753 m)   Wt (!) 153.8 kg   SpO2 98%   BMI 50.06 kg/m   Patient stable during ED stay.  Pain is 1 out of 10.  We reviewed urine results as well as kidney function.  Patient was concerned that the bleeding in his urine indicated that he was going into renal failure.  We discussed that this is not the case.  Patient will be prescribed Flomax and will hydrate well and take 600 mg of ibuprofen every 6 hours for the next day or 2.  He will return with uncontrolled symptoms.  Encouraged urology follow-up given the fact that he has several very large stones in the left kidney which may need addressed if he continues to have ureteral colic symptoms.   Final Clinical Impressions(s) / ED Diagnoses   Final diagnoses:  Gross hematuria  Flank pain   Patient with recent evaluation for renal colic presents with mild pain consistent with renal colic but more impressive hematuria today.  Urine shows blood without other signs of infection.  Normal kidney function noted.  Pain is controlled without any interventions in the emergency department.  Treatment  plan as above.  Patient seems reliable to return with any uncontrolled or worsening symptoms.  ED Discharge Orders         Ordered    tamsulosin (FLOMAX) 0.4 MG CAPS capsule  Daily     11/25/18 1422    ibuprofen (ADVIL) 600 MG tablet  Every 6 hours PRN     11/25/18 1422           Carlisle Cater, PA-C 11/25/18 1528    Blanchie Dessert, MD 12/05/18 1521

## 2018-11-25 NOTE — Discharge Instructions (Signed)
Please read and follow all provided instructions.  Your diagnoses today include:  1. Gross hematuria   2. Flank pain     Tests performed today include:  Urine test that showed blood in your urine and no infection  Blood test that showed normal kidney function  Vital signs. See below for your results today.   Medications prescribed:   Ibuprofen (Motrin, Advil) - anti-inflammatory pain medication  Do not exceed 600mg  ibuprofen every 6 hours, take with food  You have been prescribed an anti-inflammatory medication or NSAID. Take with food. Take smallest effective dose for the shortest duration needed for your pain. Stop taking if you experience stomach pain or vomiting.    Flomax (tamsulosin) - relaxes smooth muscle to help kidney stones pass  Take any prescribed medications only as directed.  Home care instructions:  Follow any educational materials contained in this packet.  Please double your fluid intake for the next several days. Strain your urine and save any stones that may pass.   BE VERY CAREFUL not to take multiple medicines containing Tylenol (also called acetaminophen). Doing so can lead to an overdose which can damage your liver and cause liver failure and possibly death.   Follow-up instructions: Please follow-up with your urologist when able for further evaluation of your symptoms and for advice regarding the large stones that are in your left kidney.  Return instructions:  If you need to return to the Emergency Department, go to [facility] and not [facility]. The urologists are located at [facility] and can better care for you at this location.   Please return to the Emergency Department if you experience worsening symptoms.  Please return if you develop fever or uncontrolled pain or vomiting.  Please return if you have any other emergent concerns.  Additional Information:  Your vital signs today were: BP (!) 165/109 (BP Location:  Left Arm)    Pulse 95    Temp 98.3 F (36.8 C) (Oral)    Resp 20    Ht 5\' 9"  (1.753 m)    Wt (!) 153.8 kg    SpO2 98%    BMI 50.06 kg/m  If your blood pressure (BP) was elevated above 135/85 this visit, please have this repeated by your doctor within one month. --------------

## 2019-01-12 ENCOUNTER — Other Ambulatory Visit: Payer: Self-pay

## 2019-01-12 ENCOUNTER — Encounter (HOSPITAL_BASED_OUTPATIENT_CLINIC_OR_DEPARTMENT_OTHER): Payer: Self-pay | Admitting: Emergency Medicine

## 2019-01-12 ENCOUNTER — Emergency Department (HOSPITAL_BASED_OUTPATIENT_CLINIC_OR_DEPARTMENT_OTHER)
Admission: EM | Admit: 2019-01-12 | Discharge: 2019-01-12 | Disposition: A | Discharge status: Home / Self Care | Payer: Self-pay | Attending: Emergency Medicine | Admitting: Emergency Medicine

## 2019-01-12 DIAGNOSIS — N201 Calculus of ureter: Secondary | ICD-10-CM | POA: Diagnosis not present

## 2019-01-12 DIAGNOSIS — Z79899 Other long term (current) drug therapy: Secondary | ICD-10-CM | POA: Insufficient documentation

## 2019-01-12 DIAGNOSIS — I10 Essential (primary) hypertension: Secondary | ICD-10-CM | POA: Diagnosis not present

## 2019-01-12 DIAGNOSIS — Z91013 Allergy to seafood: Secondary | ICD-10-CM | POA: Insufficient documentation

## 2019-01-12 LAB — URINALYSIS, ROUTINE W REFLEX MICROSCOPIC
Bilirubin Urine: NEGATIVE
Glucose, UA: NEGATIVE mg/dL
Ketones, ur: NEGATIVE mg/dL
Leukocytes,Ua: NEGATIVE
Nitrite: NEGATIVE
Protein, ur: NEGATIVE mg/dL
Specific Gravity, Urine: 1.025 (ref 1.005–1.030)
pH: 7 (ref 5.0–8.0)

## 2019-01-12 LAB — URINALYSIS, MICROSCOPIC (REFLEX)

## 2019-01-12 MED ORDER — MORPHINE SULFATE (PF) 4 MG/ML IV SOLN
4.0000 mg | Freq: Once | INTRAVENOUS | Status: AC
  Administered 2019-01-12: 4 mg via INTRAVENOUS
  Filled 2019-01-12: qty 1

## 2019-01-12 MED ORDER — TAMSULOSIN HCL 0.4 MG PO CAPS: 0.4000 mg | ORAL_CAPSULE | Freq: Every day | ORAL | 0 refills | Status: DC

## 2019-01-12 MED ORDER — OXYCODONE-ACETAMINOPHEN 5-325 MG PO TABS: 1.0000 | ORAL_TABLET | ORAL | 0 refills | Status: DC | PRN

## 2019-01-12 MED ORDER — NAPROXEN 500 MG PO TABS: 500.0000 mg | ORAL_TABLET | Freq: Two times a day (BID) | ORAL | 0 refills | Status: DC

## 2019-01-12 MED ORDER — METOCLOPRAMIDE HCL 5 MG/ML IJ SOLN
10.0000 mg | Freq: Once | INTRAMUSCULAR | Status: AC
  Administered 2019-01-12: 10 mg via INTRAVENOUS
  Filled 2019-01-12: qty 2

## 2019-01-12 MED ORDER — MORPHINE SULFATE (PF) 4 MG/ML IV SOLN
4.0000 mg | Freq: Once | INTRAVENOUS | Status: AC
  Administered 2019-01-12: 03:00:00 4 mg via INTRAVENOUS
  Filled 2019-01-12: qty 1

## 2019-01-12 MED ORDER — METOCLOPRAMIDE HCL 10 MG PO TABS: 10.0000 mg | ORAL_TABLET | Freq: Four times a day (QID) | ORAL | 0 refills | Status: DC | PRN

## 2019-01-12 MED ORDER — KETOROLAC TROMETHAMINE 30 MG/ML IJ SOLN
30.0000 mg | Freq: Once | INTRAMUSCULAR | Status: AC
  Administered 2019-01-12: 04:00:00 30 mg via INTRAVENOUS
  Filled 2019-01-12: qty 1

## 2019-01-12 MED ORDER — ONDANSETRON HCL 4 MG/2ML IJ SOLN
4.0000 mg | Freq: Once | INTRAMUSCULAR | Status: AC
  Administered 2019-01-12: 4 mg via INTRAVENOUS
  Filled 2019-01-12: qty 2

## 2019-01-12 MED ORDER — SODIUM CHLORIDE 0.9 % IV BOLUS
500.0000 mL | Freq: Once | INTRAVENOUS | Status: AC
  Administered 2019-01-12: 03:00:00 500 mL via INTRAVENOUS

## 2019-01-12 NOTE — Discharge Instructions (Signed)
Drink plenty of fluids.  Return if pain or nausea are not being adequately controlled at home, or if you start running a fever.

## 2019-01-12 NOTE — ED Triage Notes (Signed)
Pt is c/o pain in his left flank area that started about an hour ago   Denies any difficulty urinating

## 2019-01-12 NOTE — ED Provider Notes (Signed)
[facility] EMERGENC[facility] DEPARTMENT Provider Note   CSN: [id number] Arrival date & time: 01/12/19  0000    History   Chief Complaint Chief Complaint  Patient presents with  . Flank Pain    HPI Jason Bruce is a 43 y.o. male.   The history is provided by the patient.  Flank Pain  He has history of hypertension kidney stones and comes in complaining of left lower quadrant pain which started about midnight.  Pain is severe and he rates it at 7/10.  Nothing makes it better, nothing makes it worse.  There was associated nausea and vomiting.  He denies urinary urgency, frequency, tenesmus, dysuria.  He denies fever or chills.  Pain is similar to pain he has had with kidney stones and kidney infections in the past.  He took a dose of ibuprofen at home without relief.  Past Medical History:  Diagnosis Date  . Hypertension   . Nephrolithiasis     There are no active problems to display for this patient.   History reviewed. No pertinent surgical history.      Home Medications    Prior to Admission medications   Medication Sig Start Date End Date Taking? Authorizing Provider  hydrochlorothiazide (HYDRODIURIL) 25 MG tablet Take 1 tablet (25 mg total) by mouth daily. 10/27/17   Law, Bea Graff, PA-C  ibuprofen (ADVIL) 600 MG tablet Take 1 tablet (600 mg total) by mouth every 6 (six) hours as needed. 11/25/18   Carlisle Cater, PA-C  tamsulosin (FLOMAX) 0.4 MG CAPS capsule Take 1 capsule (0.4 mg total) by mouth daily. 11/25/18   Carlisle Cater, PA-C    Family History Family History  Problem Relation Age of Onset  . Cancer Mother   . Hypertension Other   . Diabetes Other     Social History Social History   Tobacco Use  . Smoking status: Never Smoker  . Smokeless tobacco: Never Used  Substance Use Topics  . Alcohol use: No  . Drug use: No     Allergies   Shellfish allergy   Review of Systems Review of Systems  Genitourinary: Positive for flank pain.   All other systems reviewed and are negative.    Physical Exam Updated Vital Signs BP (!) 233/144 (BP Location: Left Arm)   Pulse (!) 101   Temp 99 F (37.2 C) (Oral)   Resp 20   Ht 5\' 9"  (1.753 m)   Wt (!) 157.7 kg   SpO2 99%   BMI 51.35 kg/m   Physical Exam Vitals signs and nursing note reviewed.    Obese 43 year old male, resting comfortably and in no acute distress. Vital signs are significant for elevated blood pressure and borderline elevated heart rate. Oxygen saturation is 99%, which is normal. Head is normocephalic and atraumatic. PERRLA, EOMI. Oropharynx is clear. Neck is nontender and supple without adenopathy or JVD. Back is nontender and there is no CVA tenderness. Lungs are clear without rales, wheezes, or rhonchi. Chest is nontender. Heart has regular rate and rhythm without murmur. Abdomen is obese, soft, flat, with some mild to moderate tenderness in the left lower quadrant.  There is no rebound or guarding.  There are no masses or hepatosplenomegaly and peristalsis is hypoactive. Extremities have no cyanosis or edema, full range of motion is present. Skin is warm and dry without rash. Neurologic: Mental status is normal, cranial nerves are intact, there are no motor or sensory deficits.  ED Treatments / Results  Labs (  all labs ordered are listed, but only abnormal results are displayed) Labs Reviewed  URINALYSIS, ROUTINE W REFLEX MICROSCOPIC - Abnormal; Notable for the following components:      Result Value   Hgb urine dipstick TRACE (*)    All other components within normal limits  URINALYSIS, MICROSCOPIC (REFLEX) - Abnormal; Notable for the following components:   Bacteria, UA RARE (*)    All other components within normal limits   Procedures Procedures   Medications Ordered in ED Medications  ondansetron (ZOFRAN) injection 4 mg (4 mg Intravenous Given 01/12/19 0251)  sodium chloride 0.9 % bolus 500 mL (0 mLs Intravenous Stopped 01/12/19 0400)   morphine 4 MG/ML injection 4 mg (4 mg Intravenous Given 01/12/19 0252)  ketorolac (TORADOL) 30 MG/ML injection 30 mg (30 mg Intravenous Given 01/12/19 0427)  morphine 4 MG/ML injection 4 mg (4 mg Intravenous Given 01/12/19 0427)  metoCLOPramide (REGLAN) injection 10 mg (10 mg Intravenous Given 01/12/19 0427)     Initial Impression / Assessment and Plan / ED Course  I have reviewed the triage vital signs and the nursing notes.  Pertinent lab results that were available during my care of the patient were reviewed by me and considered in my medical decision making (see chart for details).  Left lower quadrant pain consistent with urolithiasis.  Urinalysis shows no evidence of infection, so doubt UTI.  Old records are reviewed, and he has had recent CT scan of abdomen and pelvis showing a 2 cm left-sided staghorn calculus, no evidence of diverticulosis.  Therefore, doubt diverticulitis.  He will be given IV fluids, morphine, ondansetron.  Anticipate urology referral.  We will also need to evaluate blood pressure response to achieving pain control.  He had partial relief of pain with above-noted treatment, but had persistent nausea and vomiting.  He is given additional morphine as well as ketorolac and metoclopramide with excellent relief of pain.  Blood pressure had come down partially with initial treatment and will be checked 1 more time.  He is referred to urology for follow-up.  Given prescriptions for tamsulosin, naproxen, metoclopramide, oxycodone-acetaminophen.  Final Clinical Impressions(s) / ED Diagnoses   Final diagnoses:  Ureterolithiasis  Elevated blood pressure reading with diagnosis of hypertension    ED Discharge Orders         Ordered    tamsulosin (FLOMAX) 0.4 MG CAPS capsule  Daily     01/12/19 0521    naproxen (NAPROSYN) 500 MG tablet  2 times daily     01/12/19 0521    metoCLOPramide (REGLAN) 10 MG tablet  Every 6 hours PRN     01/12/19 0521    oxyCODONE-acetaminophen  (PERCOCET) 5-325 MG tablet  Every 4 hours PRN     01/12/19 [id number]           Delora Fuel, MD [id number] [phone]

## 2019-01-13 DIAGNOSIS — N179 Acute kidney failure, unspecified: Secondary | ICD-10-CM | POA: Diagnosis not present

## 2019-01-13 DIAGNOSIS — K5730 Diverticulosis of large intestine without perforation or abscess without bleeding: Secondary | ICD-10-CM | POA: Diagnosis not present

## 2019-01-13 DIAGNOSIS — N132 Hydronephrosis with renal and ureteral calculous obstruction: Secondary | ICD-10-CM | POA: Diagnosis not present

## 2019-01-13 DIAGNOSIS — N201 Calculus of ureter: Secondary | ICD-10-CM | POA: Diagnosis not present

## 2019-01-13 DIAGNOSIS — I160 Hypertensive urgency: Secondary | ICD-10-CM | POA: Diagnosis not present

## 2019-01-13 DIAGNOSIS — K760 Fatty (change of) liver, not elsewhere classified: Secondary | ICD-10-CM | POA: Diagnosis not present

## 2019-01-13 DIAGNOSIS — R109 Unspecified abdominal pain: Secondary | ICD-10-CM | POA: Diagnosis not present

## 2019-01-13 DIAGNOSIS — I10 Essential (primary) hypertension: Secondary | ICD-10-CM | POA: Diagnosis not present

## 2019-01-14 DIAGNOSIS — Z48816 Encounter for surgical aftercare following surgery on the genitourinary system: Secondary | ICD-10-CM | POA: Diagnosis not present

## 2019-01-14 DIAGNOSIS — N202 Calculus of kidney with calculus of ureter: Secondary | ICD-10-CM | POA: Diagnosis not present

## 2019-01-14 DIAGNOSIS — Z960 Presence of urogenital implants: Secondary | ICD-10-CM | POA: Diagnosis not present

## 2019-01-14 DIAGNOSIS — N201 Calculus of ureter: Secondary | ICD-10-CM | POA: Diagnosis not present

## 2019-01-19 DIAGNOSIS — N200 Calculus of kidney: Secondary | ICD-10-CM | POA: Diagnosis not present

## 2019-01-24 DIAGNOSIS — R509 Fever, unspecified: Secondary | ICD-10-CM | POA: Diagnosis not present

## 2019-01-24 DIAGNOSIS — N400 Enlarged prostate without lower urinary tract symptoms: Secondary | ICD-10-CM | POA: Diagnosis not present

## 2019-01-24 DIAGNOSIS — N390 Urinary tract infection, site not specified: Secondary | ICD-10-CM | POA: Diagnosis not present

## 2019-01-24 DIAGNOSIS — E785 Hyperlipidemia, unspecified: Secondary | ICD-10-CM | POA: Diagnosis not present

## 2019-01-24 DIAGNOSIS — E1122 Type 2 diabetes mellitus with diabetic chronic kidney disease: Secondary | ICD-10-CM | POA: Diagnosis not present

## 2019-01-24 DIAGNOSIS — N200 Calculus of kidney: Secondary | ICD-10-CM | POA: Diagnosis not present

## 2019-01-24 DIAGNOSIS — I10 Essential (primary) hypertension: Secondary | ICD-10-CM | POA: Diagnosis not present

## 2019-01-24 DIAGNOSIS — Z6841 Body Mass Index (BMI) 40.0 and over, adult: Secondary | ICD-10-CM | POA: Diagnosis not present

## 2019-01-24 DIAGNOSIS — N182 Chronic kidney disease, stage 2 (mild): Secondary | ICD-10-CM | POA: Diagnosis not present

## 2019-01-24 DIAGNOSIS — Z960 Presence of urogenital implants: Secondary | ICD-10-CM | POA: Diagnosis not present

## 2019-01-24 DIAGNOSIS — N10 Acute tubulo-interstitial nephritis: Secondary | ICD-10-CM | POA: Diagnosis not present

## 2019-01-24 DIAGNOSIS — R197 Diarrhea, unspecified: Secondary | ICD-10-CM | POA: Diagnosis not present

## 2019-01-24 DIAGNOSIS — R0602 Shortness of breath: Secondary | ICD-10-CM | POA: Diagnosis not present

## 2019-01-24 DIAGNOSIS — B965 Pseudomonas (aeruginosa) (mallei) (pseudomallei) as the cause of diseases classified elsewhere: Secondary | ICD-10-CM | POA: Diagnosis not present

## 2019-01-24 DIAGNOSIS — B9689 Other specified bacterial agents as the cause of diseases classified elsewhere: Secondary | ICD-10-CM | POA: Diagnosis not present

## 2019-01-24 DIAGNOSIS — I129 Hypertensive chronic kidney disease with stage 1 through stage 4 chronic kidney disease, or unspecified chronic kidney disease: Secondary | ICD-10-CM | POA: Diagnosis not present

## 2019-01-24 DIAGNOSIS — Z794 Long term (current) use of insulin: Secondary | ICD-10-CM | POA: Diagnosis not present

## 2019-01-24 DIAGNOSIS — A419 Sepsis, unspecified organism: Secondary | ICD-10-CM | POA: Diagnosis not present

## 2019-01-24 DIAGNOSIS — Z79899 Other long term (current) drug therapy: Secondary | ICD-10-CM | POA: Diagnosis not present

## 2019-01-24 DIAGNOSIS — I160 Hypertensive urgency: Secondary | ICD-10-CM | POA: Diagnosis not present

## 2019-01-25 DIAGNOSIS — N200 Calculus of kidney: Secondary | ICD-10-CM | POA: Insufficient documentation

## 2019-02-02 DIAGNOSIS — N201 Calculus of ureter: Secondary | ICD-10-CM | POA: Diagnosis not present

## 2019-02-02 DIAGNOSIS — N200 Calculus of kidney: Secondary | ICD-10-CM | POA: Diagnosis not present

## 2019-02-25 DIAGNOSIS — I4519 Other right bundle-branch block: Secondary | ICD-10-CM | POA: Diagnosis not present

## 2019-02-25 DIAGNOSIS — N200 Calculus of kidney: Secondary | ICD-10-CM | POA: Diagnosis not present

## 2019-02-25 DIAGNOSIS — Z466 Encounter for fitting and adjustment of urinary device: Secondary | ICD-10-CM | POA: Diagnosis not present

## 2019-02-25 DIAGNOSIS — R112 Nausea with vomiting, unspecified: Secondary | ICD-10-CM | POA: Diagnosis not present

## 2019-02-25 DIAGNOSIS — Z960 Presence of urogenital implants: Secondary | ICD-10-CM | POA: Diagnosis not present

## 2019-02-25 DIAGNOSIS — N179 Acute kidney failure, unspecified: Secondary | ICD-10-CM | POA: Diagnosis not present

## 2019-03-08 DIAGNOSIS — R000 Tachycardia, unspecified: Secondary | ICD-10-CM | POA: Diagnosis not present

## 2019-03-08 DIAGNOSIS — R200 Anesthesia of skin: Secondary | ICD-10-CM | POA: Diagnosis not present

## 2019-03-08 DIAGNOSIS — R202 Paresthesia of skin: Secondary | ICD-10-CM | POA: Diagnosis not present

## 2019-03-09 DIAGNOSIS — N179 Acute kidney failure, unspecified: Secondary | ICD-10-CM | POA: Diagnosis not present

## 2019-03-09 DIAGNOSIS — N529 Male erectile dysfunction, unspecified: Secondary | ICD-10-CM | POA: Diagnosis not present

## 2019-03-09 DIAGNOSIS — N132 Hydronephrosis with renal and ureteral calculous obstruction: Secondary | ICD-10-CM | POA: Diagnosis not present

## 2019-03-09 DIAGNOSIS — N200 Calculus of kidney: Secondary | ICD-10-CM | POA: Diagnosis not present

## 2019-03-10 DIAGNOSIS — N200 Calculus of kidney: Secondary | ICD-10-CM | POA: Diagnosis not present

## 2019-03-10 DIAGNOSIS — Z20822 Contact with and (suspected) exposure to covid-19: Secondary | ICD-10-CM | POA: Diagnosis not present

## 2019-03-10 DIAGNOSIS — Z01812 Encounter for preprocedural laboratory examination: Secondary | ICD-10-CM | POA: Diagnosis not present

## 2019-03-11 DIAGNOSIS — N132 Hydronephrosis with renal and ureteral calculous obstruction: Secondary | ICD-10-CM | POA: Diagnosis not present

## 2019-03-11 DIAGNOSIS — N179 Acute kidney failure, unspecified: Secondary | ICD-10-CM | POA: Diagnosis not present

## 2019-03-11 DIAGNOSIS — N529 Male erectile dysfunction, unspecified: Secondary | ICD-10-CM | POA: Diagnosis not present

## 2019-03-11 DIAGNOSIS — N200 Calculus of kidney: Secondary | ICD-10-CM | POA: Diagnosis not present

## 2019-03-12 DIAGNOSIS — N200 Calculus of kidney: Secondary | ICD-10-CM | POA: Diagnosis not present

## 2019-03-18 DIAGNOSIS — N200 Calculus of kidney: Secondary | ICD-10-CM | POA: Diagnosis not present

## 2019-03-18 DIAGNOSIS — N529 Male erectile dysfunction, unspecified: Secondary | ICD-10-CM | POA: Diagnosis not present

## 2019-04-07 DIAGNOSIS — N529 Male erectile dysfunction, unspecified: Secondary | ICD-10-CM | POA: Diagnosis not present

## 2019-04-07 DIAGNOSIS — N200 Calculus of kidney: Secondary | ICD-10-CM | POA: Diagnosis not present

## 2019-04-08 DIAGNOSIS — I10 Essential (primary) hypertension: Secondary | ICD-10-CM | POA: Diagnosis not present

## 2019-09-19 DIAGNOSIS — R05 Cough: Secondary | ICD-10-CM | POA: Diagnosis not present

## 2019-09-19 DIAGNOSIS — R509 Fever, unspecified: Secondary | ICD-10-CM | POA: Diagnosis not present

## 2019-09-22 DIAGNOSIS — R05 Cough: Secondary | ICD-10-CM | POA: Diagnosis not present

## 2019-09-22 DIAGNOSIS — U071 COVID-19: Secondary | ICD-10-CM | POA: Diagnosis not present

## 2019-09-22 DIAGNOSIS — J9811 Atelectasis: Secondary | ICD-10-CM | POA: Diagnosis not present

## 2019-09-22 DIAGNOSIS — K760 Fatty (change of) liver, not elsewhere classified: Secondary | ICD-10-CM | POA: Diagnosis not present

## 2019-09-22 DIAGNOSIS — K5730 Diverticulosis of large intestine without perforation or abscess without bleeding: Secondary | ICD-10-CM | POA: Diagnosis not present

## 2019-09-22 DIAGNOSIS — R319 Hematuria, unspecified: Secondary | ICD-10-CM | POA: Diagnosis not present

## 2019-09-29 ENCOUNTER — Encounter (HOSPITAL_BASED_OUTPATIENT_CLINIC_OR_DEPARTMENT_OTHER): Payer: Self-pay | Admitting: Emergency Medicine

## 2019-09-29 ENCOUNTER — Emergency Department (HOSPITAL_BASED_OUTPATIENT_CLINIC_OR_DEPARTMENT_OTHER): Payer: HRSA Program

## 2019-09-29 ENCOUNTER — Other Ambulatory Visit: Payer: Self-pay

## 2019-09-29 ENCOUNTER — Inpatient Hospital Stay (HOSPITAL_BASED_OUTPATIENT_CLINIC_OR_DEPARTMENT_OTHER)
Admission: EM | Admit: 2019-09-29 | Discharge: 2019-10-03 | DRG: 177 | Disposition: A | Discharge status: Home / Self Care | Payer: HRSA Program | Attending: Internal Medicine | Admitting: Internal Medicine

## 2019-09-29 DIAGNOSIS — Z6841 Body Mass Index (BMI) 40.0 and over, adult: Secondary | ICD-10-CM

## 2019-09-29 DIAGNOSIS — Y92239 Unspecified place in hospital as the place of occurrence of the external cause: Secondary | ICD-10-CM | POA: Diagnosis present

## 2019-09-29 DIAGNOSIS — R739 Hyperglycemia, unspecified: Secondary | ICD-10-CM

## 2019-09-29 DIAGNOSIS — Z79899 Other long term (current) drug therapy: Secondary | ICD-10-CM

## 2019-09-29 DIAGNOSIS — Z283 Underimmunization status: Secondary | ICD-10-CM

## 2019-09-29 DIAGNOSIS — J9601 Acute respiratory failure with hypoxia: Secondary | ICD-10-CM | POA: Diagnosis present

## 2019-09-29 DIAGNOSIS — E1165 Type 2 diabetes mellitus with hyperglycemia: Secondary | ICD-10-CM | POA: Diagnosis not present

## 2019-09-29 DIAGNOSIS — R7401 Elevation of levels of liver transaminase levels: Secondary | ICD-10-CM | POA: Diagnosis present

## 2019-09-29 DIAGNOSIS — R748 Abnormal levels of other serum enzymes: Secondary | ICD-10-CM

## 2019-09-29 DIAGNOSIS — T380X5A Adverse effect of glucocorticoids and synthetic analogues, initial encounter: Secondary | ICD-10-CM | POA: Diagnosis present

## 2019-09-29 DIAGNOSIS — R8271 Bacteriuria: Secondary | ICD-10-CM

## 2019-09-29 DIAGNOSIS — Z833 Family history of diabetes mellitus: Secondary | ICD-10-CM

## 2019-09-29 DIAGNOSIS — R112 Nausea with vomiting, unspecified: Secondary | ICD-10-CM

## 2019-09-29 DIAGNOSIS — U071 COVID-19: Secondary | ICD-10-CM | POA: Diagnosis not present

## 2019-09-29 DIAGNOSIS — Z91013 Allergy to seafood: Secondary | ICD-10-CM

## 2019-09-29 DIAGNOSIS — Z8249 Family history of ischemic heart disease and other diseases of the circulatory system: Secondary | ICD-10-CM

## 2019-09-29 DIAGNOSIS — Z87442 Personal history of urinary calculi: Secondary | ICD-10-CM

## 2019-09-29 DIAGNOSIS — Z7984 Long term (current) use of oral hypoglycemic drugs: Secondary | ICD-10-CM

## 2019-09-29 DIAGNOSIS — J1282 Pneumonia due to coronavirus disease 2019: Secondary | ICD-10-CM | POA: Diagnosis not present

## 2019-09-29 DIAGNOSIS — R05 Cough: Secondary | ICD-10-CM | POA: Diagnosis not present

## 2019-09-29 DIAGNOSIS — R0602 Shortness of breath: Secondary | ICD-10-CM | POA: Diagnosis not present

## 2019-09-29 DIAGNOSIS — E860 Dehydration: Secondary | ICD-10-CM | POA: Diagnosis not present

## 2019-09-29 DIAGNOSIS — N179 Acute kidney failure, unspecified: Secondary | ICD-10-CM

## 2019-09-29 DIAGNOSIS — E119 Type 2 diabetes mellitus without complications: Secondary | ICD-10-CM

## 2019-09-29 DIAGNOSIS — I10 Essential (primary) hypertension: Secondary | ICD-10-CM

## 2019-09-29 DIAGNOSIS — J9811 Atelectasis: Secondary | ICD-10-CM | POA: Diagnosis not present

## 2019-09-29 LAB — URINALYSIS, ROUTINE W REFLEX MICROSCOPIC
Bilirubin Urine: NEGATIVE
Glucose, UA: NEGATIVE mg/dL
Hgb urine dipstick: NEGATIVE
Ketones, ur: NEGATIVE mg/dL
Leukocytes,Ua: NEGATIVE
Nitrite: NEGATIVE
Protein, ur: 100 mg/dL — AB
Specific Gravity, Urine: >1.03 — ABNORMAL HIGH (ref 1.005–1.030)
pH: 5 (ref 5.0–8.0)

## 2019-09-29 LAB — COMPREHENSIVE METABOLIC PANEL
ALT: 62 U/L — ABNORMAL HIGH (ref 0–44)
AST: 51 U/L — ABNORMAL HIGH (ref 15–41)
Albumin: 4 g/dL (ref 3.5–5.0)
Alkaline Phosphatase: 61 U/L (ref 38–126)
Anion gap: 17 — ABNORMAL HIGH (ref 5–15)
BUN: 50 mg/dL — ABNORMAL HIGH (ref 6–20)
CO2: 26 mmol/L (ref 22–32)
Calcium: 9 mg/dL (ref 8.9–10.3)
Chloride: 93 mmol/L — ABNORMAL LOW (ref 98–111)
Creatinine, Ser: 1.6 mg/dL — ABNORMAL HIGH (ref 0.61–1.24)
GFR calc Af Amer: 59 mL/min — ABNORMAL LOW (ref >60)
GFR calc non Af Amer: 51 mL/min — ABNORMAL LOW (ref >60)
Glucose, Bld: 267 mg/dL — ABNORMAL HIGH (ref 70–99)
Potassium: 3.5 mmol/L (ref 3.5–5.1)
Sodium: 136 mmol/L (ref 135–145)
Total Bilirubin: 0.7 mg/dL (ref 0.3–1.2)
Total Protein: 9.3 g/dL — ABNORMAL HIGH (ref 6.5–8.1)

## 2019-09-29 LAB — CBC
HCT: 44.2 % (ref 39.0–52.0)
Hemoglobin: 13.9 g/dL (ref 13.0–17.0)
MCH: 27.3 pg (ref 26.0–34.0)
MCHC: 31.4 g/dL (ref 30.0–36.0)
MCV: 86.8 fL (ref 80.0–100.0)
Platelets: 386 10*3/uL (ref 150–400)
RBC: 5.09 MIL/uL (ref 4.22–5.81)
RDW: 14.5 % (ref 11.5–15.5)
WBC: 6.7 10*3/uL (ref 4.0–10.5)
nRBC: 0 % (ref 0.0–0.2)

## 2019-09-29 LAB — URINALYSIS, MICROSCOPIC (REFLEX)

## 2019-09-29 LAB — LIPASE, BLOOD: Lipase: 76 U/L — ABNORMAL HIGH (ref 11–51)

## 2019-09-29 MED ORDER — SODIUM CHLORIDE 0.9 % IV BOLUS
1000.0000 mL | Freq: Once | INTRAVENOUS | Status: AC
  Administered 2019-09-30: 1000 mL via INTRAVENOUS

## 2019-09-29 MED ORDER — SODIUM CHLORIDE 0.9 % IV BOLUS
1000.0000 mL | Freq: Once | INTRAVENOUS | Status: AC
  Administered 2019-09-29: 1000 mL via INTRAVENOUS

## 2019-09-29 MED ORDER — ONDANSETRON HCL 4 MG/2ML IJ SOLN
4.0000 mg | Freq: Once | INTRAMUSCULAR | Status: AC
  Administered 2019-09-29: 4 mg via INTRAVENOUS
  Filled 2019-09-29: qty 2

## 2019-09-29 NOTE — ED Triage Notes (Addendum)
EMS transport from Home, pt c/o SOB, Vomiting Diarrhea. COVID positive 8/8/. Sat 92% on r/a in triage. NO acute SOB>. C/O feeling dehydrated. PT place on [place] 2 liters in triage. Sat 98% non labored breathing. No active vomiting.

## 2019-09-29 NOTE — ED Notes (Signed)
PT aware of need for UA 

## 2019-09-29 NOTE — ED Notes (Signed)
SpO2 90% after ambulating. Drinking soda in the lobby

## 2019-09-29 NOTE — ED Provider Notes (Addendum)
[facility] Provider Note: Georgena Spurling, MD, FACEP  CSN: [id number] MRN: [id number] ARRIVAL: 09/29/19 at [facility]: [facility] [place] [facility]   HISTORY OF PRESENT ILLNESS  09/29/19 11:07 PM Jason Bruce is a 44 y.o. male with Covid-like symptoms for 12 days.  He tested positive for Covid on 09/19/2019.  Specifically he has had fever, body aches, nasal congestion without sore throat, cough, shortness of breath, and loss of taste and smell.  He has been taking Tylenol with partial relief of his body aches.  About 6 days into his illness he started having vomiting and diarrhea. He feels like he is dehydrated as his mouth is dry.  He was noted to be tachycardic on arrival.  His oxygen saturation on arrival was noted to be 92% on room air which improved to 99% on 2 L by nasal cannula.  He denies shortness of breath currently.  His significant other reports dyspnea on exertion and oxygen saturations dropping into the 80s at times.   Past Medical History:  Diagnosis Date  . Hypertension   . Nephrolithiasis     History reviewed. No pertinent surgical history.  Family History  Problem Relation Age of Onset  . Cancer Mother   . Hypertension Other   . Diabetes Other     Social History   Tobacco Use  . Smoking status: Never Smoker  . Smokeless tobacco: Never Used  Vaping Use  . Vaping Use: Never used  Substance Use Topics  . Alcohol use: No  . Drug use: No    Prior to Admission medications   Medication Sig Start Date End Date Taking? Authorizing Provider  hydrochlorothiazide (HYDRODIURIL) 25 MG tablet Take 1 tablet (25 mg total) by mouth daily. 10/27/17   Law, Bea Graff, PA-C  metoCLOPramide (REGLAN) 10 MG tablet Take 1 tablet (10 mg total) by mouth every 6 (six) hours as needed for nausea (or headache). 53/6/64   Delora Fuel, MD  naproxen (NAPROSYN) 500 MG tablet Take 1 tablet (500 mg total) by mouth 2 (two) times daily. 40/3/47   Delora Fuel, MD  oxyCODONE-acetaminophen (PERCOCET) 5-325 MG tablet Take 1 tablet by mouth every 4 (four) hours as needed for moderate pain. 42/5/95   Delora Fuel, MD  tamsulosin (FLOMAX) 0.4 MG CAPS capsule Take 1 capsule (0.4 mg total) by mouth daily. 63/8/75   Delora Fuel, MD    Allergies Shellfish allergy   REVIEW OF SYSTEMS  Negative except as noted here or in the History of Present Illness.   PHYSICAL EXAMINATION  Initial Vital Signs Blood pressure (!) 158/136, pulse (!) 114, temperature 98.4 F (36.9 C), resp. rate 20, height 5\' 9"  (1.753 m), weight 136.1 kg, SpO2 99 %.  Examination General: Well-developed, well-nourished male in no acute distress; appearance consistent with age of record HENT: normocephalic; atraumatic; dry mucous membranes Eyes: pupils equal, round and reactive to light; extraocular muscles intact Neck: supple Heart: regular rate and rhythm; tachycardia Lungs: clear to auscultation bilaterally Abdomen: soft; nondistended; nontender; bowel sounds present Extremities: No deformity; full range of motion; pulses normal; trace edema of lower legs Neurologic: Awake, alert and oriented; motor function intact in all extremities and symmetric; no facial droop Skin: Warm and dry Psychiatric: Normal mood and affect   RESULTS  Summary of this visit's results, reviewed and interpreted by myself:   EKG Interpretation  Date/Time:    Ventricular Rate:    PR Interval:    QRS Duration:  QT Interval:    QTC Calculation:   R Axis:     Text Interpretation:        Laboratory Studies: Results for orders placed or performed during the hospital encounter of 09/29/19 (from the past 24 hour(s))  Lipase, blood     Status: Abnormal   Collection Time: 09/29/19  5:15 PM  Result Value Ref Range   Lipase 76 (H) 11 - 51 U/L  Comprehensive metabolic panel     Status: Abnormal   Collection Time: 09/29/19  5:15 PM  Result Value Ref Range   Sodium 136 135 - 145 mmol/L    Potassium 3.5 3.5 - 5.1 mmol/L   Chloride 93 (L) 98 - 111 mmol/L   CO2 26 22 - 32 mmol/L   Glucose, Bld 267 (H) 70 - 99 mg/dL   BUN 50 (H) 6 - 20 mg/dL   Creatinine, Ser 1.60 (H) 0.61 - 1.24 mg/dL   Calcium 9.0 8.9 - 10.3 mg/dL   Total Protein 9.3 (H) 6.5 - 8.1 g/dL   Albumin 4.0 3.5 - 5.0 g/dL   AST 51 (H) 15 - 41 U/L   ALT 62 (H) 0 - 44 U/L   Alkaline Phosphatase 61 38 - 126 U/L   Total Bilirubin 0.7 0.3 - 1.2 mg/dL   GFR calc non Af Amer 51 (L) >60 mL/min   GFR calc Af Amer 59 (L) >60 mL/min   Anion gap 17 (H) 5 - 15  CBC     Status: None   Collection Time: 09/29/19  5:15 PM  Result Value Ref Range   WBC 6.7 4.0 - 10.5 K/uL   RBC 5.09 4.22 - 5.81 MIL/uL   Hemoglobin 13.9 13.0 - 17.0 g/dL   HCT 44.2 39 - 52 %   MCV 86.8 80.0 - 100.0 fL   MCH 27.3 26.0 - 34.0 pg   MCHC 31.4 30.0 - 36.0 g/dL   RDW 14.5 11.5 - 15.5 %   Platelets 386 150 - 400 K/uL   nRBC 0.0 0.0 - 0.2 %  Urinalysis, Routine w reflex microscopic Urine, Clean Catch     Status: Abnormal   Collection Time: 09/29/19 11:22 PM  Result Value Ref Range   Color, Urine YELLOW YELLOW   APPearance CLEAR CLEAR   Specific Gravity, Urine >1.030 (H) 1.005 - 1.030   pH 5.0 5.0 - 8.0   Glucose, UA NEGATIVE NEGATIVE mg/dL   Hgb urine dipstick NEGATIVE NEGATIVE   Bilirubin Urine NEGATIVE NEGATIVE   Ketones, ur NEGATIVE NEGATIVE mg/dL   Protein, ur 100 (A) NEGATIVE mg/dL   Nitrite NEGATIVE NEGATIVE   Leukocytes,Ua NEGATIVE NEGATIVE  Urinalysis, Microscopic (reflex)     Status: Abnormal   Collection Time: 09/29/19 11:22 PM  Result Value Ref Range   RBC / HPF 0-5 0 - 5 RBC/hpf   WBC, UA 11-20 0 - 5 WBC/hpf   Bacteria, UA MANY (A) NONE SEEN   Squamous Epithelial / LPF 0-5 0 - 5  D-dimer, quantitative (not at [facility])     Status: Abnormal   Collection Time: 09/30/19  4:24 AM  Result Value Ref Range   D-Dimer, Quant 0.60 (H) 0.00 - 0.50 ug/mL-FEU  Hemoglobin A1c     Status: Abnormal   Collection Time: 09/30/19  4:25 AM    Result Value Ref Range   Hgb A1c MFr Bld 9.2 (H) 4.8 - 5.6 %   Mean Plasma Glucose 217.34 mg/dL   Imaging Studies: DG Chest Portable 1 View  Result Date: 09/29/2019 CLINICAL DATA:  Cough with shortness of breath. COVID positive. EXAM: PORTABLE CHEST 1 VIEW COMPARISON:  September 22, 2019 FINDINGS: Mildly decreased lung volumes are seen which is likely secondary to the degree of patient inspiration. Mild linear atelectasis is seen within the right perihilar region, mid left lung and left lung base. Mild slightly patchy appearing left basilar atelectasis and/or infiltrate is also seen. There is no evidence of a pleural effusion or pneumothorax. The heart size and mediastinal contours are within normal limits. The visualized skeletal structures are unremarkable. IMPRESSION: Mild slightly patchy appearing left basilar atelectasis and/or infiltrate. Electronically Signed   By: Virgina Norfolk M.D.   On: 09/29/2019 17:55    ED COURSE and MDM  Nursing notes, initial and subsequent vitals signs, including pulse oximetry, reviewed and interpreted by myself.  Vitals:   09/30/19 0334 09/30/19 0400 09/30/19 0500 09/30/19 0600  BP: (!) 141/99 (!) 143/105 130/73 (!) 145/93  Pulse: 98 96 97 91  Resp: 16     Temp: 97.8 F (36.6 C)     TempSrc: Oral     SpO2: 92% 93% 91% (!) 89%  Weight:      Height:       Medications  remdesivir 200 mg in sodium chloride 0.9% 250 mL IVPB (200 mg Intravenous Not Given 09/30/19 0459)    Followed by  remdesivir 100 mg in sodium chloride 0.9 % 100 mL IVPB (100 mg Intravenous New Bag/Given 09/30/19 0457)  sodium chloride 0.9 % bolus 1,000 mL ( Intravenous Stopped 09/30/19 0034)  ondansetron (ZOFRAN) injection 4 mg (4 mg Intravenous Given 09/29/19 2320)  sodium chloride 0.9 % bolus 1,000 mL ( Intravenous Stopped 09/30/19 0140)  dexamethasone (DECADRON) injection 10 mg (10 mg Intravenous Given 09/30/19 0349)   3:24 AM Epic back up after prolonged downtime.  Patient's oxygen  saturation has been in the low 90s on nasal cannula.  He is showing evidence of Covid pneumonia on chest x-ray and as noted above had lower oxygen saturations at home.  We have given him 2 L of normal saline for rehydration but I believe he would benefit from admission and IV remdesivir/dexamethasone.  3:38 AM Dr. Alfredo Martinez to admit to hospitalist service.  Additional lab studies ordered.  Urine sent for culture.   PROCEDURES  Procedures  CRITICAL CARE Performed by: Karen Chafe Kolten Ryback Total critical care time: 30 minutes Critical care time was exclusive of separately billable procedures and treating other patients. Critical care was necessary to treat or prevent imminent or life-threatening deterioration. Critical care was time spent personally by me on the following activities: development of treatment plan with patient and/or surrogate as well as nursing, discussions with consultants, evaluation of patient's response to treatment, examination of patient, obtaining history from patient or surrogate, ordering and performing treatments and interventions, ordering and review of laboratory studies, ordering and review of radiographic studies, pulse oximetry and re-evaluation of patient's condition.   ED DIAGNOSES     ICD-10-CM   1. Pneumonia due to COVID-19 virus  U07.1    J12.82   2. Nausea and vomiting in adult  R11.2   3. Dehydration  E86.0   4. Hyperglycemia  R73.9        Anabeth Chilcott, Jenny Reichmann, MD 09/30/19 0559    [name], MD 09/30/19 0630

## 2019-09-30 DIAGNOSIS — Z79899 Other long term (current) drug therapy: Secondary | ICD-10-CM | POA: Diagnosis not present

## 2019-09-30 DIAGNOSIS — E119 Type 2 diabetes mellitus without complications: Secondary | ICD-10-CM | POA: Diagnosis not present

## 2019-09-30 DIAGNOSIS — R8271 Bacteriuria: Secondary | ICD-10-CM

## 2019-09-30 DIAGNOSIS — R7401 Elevation of levels of liver transaminase levels: Secondary | ICD-10-CM | POA: Diagnosis present

## 2019-09-30 DIAGNOSIS — U071 COVID-19: Secondary | ICD-10-CM | POA: Diagnosis present

## 2019-09-30 DIAGNOSIS — Z7984 Long term (current) use of oral hypoglycemic drugs: Secondary | ICD-10-CM | POA: Diagnosis not present

## 2019-09-30 DIAGNOSIS — R748 Abnormal levels of other serum enzymes: Secondary | ICD-10-CM

## 2019-09-30 DIAGNOSIS — J9601 Acute respiratory failure with hypoxia: Secondary | ICD-10-CM

## 2019-09-30 DIAGNOSIS — N179 Acute kidney failure, unspecified: Secondary | ICD-10-CM | POA: Diagnosis not present

## 2019-09-30 DIAGNOSIS — Z283 Underimmunization status: Secondary | ICD-10-CM | POA: Diagnosis not present

## 2019-09-30 DIAGNOSIS — E1165 Type 2 diabetes mellitus with hyperglycemia: Secondary | ICD-10-CM | POA: Diagnosis present

## 2019-09-30 DIAGNOSIS — Z8249 Family history of ischemic heart disease and other diseases of the circulatory system: Secondary | ICD-10-CM | POA: Diagnosis not present

## 2019-09-30 DIAGNOSIS — J1282 Pneumonia due to coronavirus disease 2019: Secondary | ICD-10-CM

## 2019-09-30 DIAGNOSIS — E860 Dehydration: Secondary | ICD-10-CM | POA: Diagnosis present

## 2019-09-30 DIAGNOSIS — Z87442 Personal history of urinary calculi: Secondary | ICD-10-CM | POA: Diagnosis not present

## 2019-09-30 DIAGNOSIS — T380X5A Adverse effect of glucocorticoids and synthetic analogues, initial encounter: Secondary | ICD-10-CM | POA: Diagnosis present

## 2019-09-30 DIAGNOSIS — I10 Essential (primary) hypertension: Secondary | ICD-10-CM

## 2019-09-30 DIAGNOSIS — Z91013 Allergy to seafood: Secondary | ICD-10-CM | POA: Diagnosis not present

## 2019-09-30 DIAGNOSIS — Y92239 Unspecified place in hospital as the place of occurrence of the external cause: Secondary | ICD-10-CM | POA: Diagnosis present

## 2019-09-30 DIAGNOSIS — Z6841 Body Mass Index (BMI) 40.0 and over, adult: Secondary | ICD-10-CM | POA: Diagnosis not present

## 2019-09-30 DIAGNOSIS — Z833 Family history of diabetes mellitus: Secondary | ICD-10-CM | POA: Diagnosis not present

## 2019-09-30 HISTORY — DX: Acute respiratory failure with hypoxia: J96.01

## 2019-09-30 HISTORY — DX: COVID-19: U07.1

## 2019-09-30 HISTORY — DX: Pneumonia due to coronavirus disease 2019: J12.82

## 2019-09-30 LAB — LACTATE DEHYDROGENASE: LDH: 248 U/L — ABNORMAL HIGH (ref 98–192)

## 2019-09-30 LAB — TROPONIN I (HIGH SENSITIVITY)
Troponin I (High Sensitivity): 20 ng/L — ABNORMAL HIGH (ref <18)
Troponin I (High Sensitivity): 21 ng/L — ABNORMAL HIGH (ref <18)

## 2019-09-30 LAB — CBC
HCT: 41.2 % (ref 39.0–52.0)
Hemoglobin: 12.6 g/dL — ABNORMAL LOW (ref 13.0–17.0)
MCH: 27 pg (ref 26.0–34.0)
MCHC: 30.6 g/dL (ref 30.0–36.0)
MCV: 88.4 fL (ref 80.0–100.0)
Platelets: 419 10*3/uL — ABNORMAL HIGH (ref 150–400)
RBC: 4.66 MIL/uL (ref 4.22–5.81)
RDW: 14.3 % (ref 11.5–15.5)
WBC: 6.1 10*3/uL (ref 4.0–10.5)
nRBC: 0 % (ref 0.0–0.2)

## 2019-09-30 LAB — HEPATITIS B SURFACE ANTIGEN: Hepatitis B Surface Ag: NONREACTIVE

## 2019-09-30 LAB — GLUCOSE, CAPILLARY
Glucose-Capillary: 382 mg/dL — ABNORMAL HIGH (ref 70–99)
Glucose-Capillary: 379 mg/dL — ABNORMAL HIGH (ref 70–99)

## 2019-09-30 LAB — FERRITIN: Ferritin: 830 ng/mL — ABNORMAL HIGH (ref 24–336)

## 2019-09-30 LAB — C-REACTIVE PROTEIN: CRP: 4.7 mg/dL — ABNORMAL HIGH (ref <1.0)

## 2019-09-30 LAB — CREATININE, SERUM
Creatinine, Ser: 1.7 mg/dL — ABNORMAL HIGH (ref 0.61–1.24)
GFR calc Af Amer: 55 mL/min — ABNORMAL LOW (ref >60)
GFR calc non Af Amer: 48 mL/min — ABNORMAL LOW (ref >60)

## 2019-09-30 LAB — HIV ANTIBODY (ROUTINE TESTING W REFLEX): HIV Screen 4th Generation wRfx: NONREACTIVE

## 2019-09-30 LAB — PROCALCITONIN: Procalcitonin: <0.1 ng/mL

## 2019-09-30 LAB — FIBRINOGEN: Fibrinogen: 660 mg/dL — ABNORMAL HIGH (ref 210–475)

## 2019-09-30 LAB — D-DIMER, QUANTITATIVE: D-Dimer, Quant: 0.6 ug/mL-FEU — ABNORMAL HIGH (ref 0.00–0.50)

## 2019-09-30 LAB — BRAIN NATRIURETIC PEPTIDE: B Natriuretic Peptide: 23 pg/mL (ref 0.0–100.0)

## 2019-09-30 LAB — ABO/RH: ABO/RH(D): B POS

## 2019-09-30 LAB — HEMOGLOBIN A1C
Hgb A1c MFr Bld: 9.2 % — ABNORMAL HIGH (ref 4.8–5.6)
Mean Plasma Glucose: 217.34 mg/dL

## 2019-09-30 MED ORDER — GUAIFENESIN-DM 100-10 MG/5ML PO SYRP: 10.0000 mL | ORAL_SOLUTION | ORAL | Status: DC | PRN

## 2019-09-30 MED ORDER — HYDROCOD POLST-CPM POLST ER 10-8 MG/5ML PO SUER: 5.0000 mL | Freq: Two times a day (BID) | ORAL | Status: DC | PRN

## 2019-09-30 MED ORDER — DEXAMETHASONE SODIUM PHOSPHATE 10 MG/ML IJ SOLN
10.0000 mg | Freq: Once | INTRAMUSCULAR | Status: AC
  Administered 2019-09-30: 10 mg via INTRAVENOUS
  Filled 2019-09-30: qty 1

## 2019-09-30 MED ORDER — SODIUM CHLORIDE 0.9 % IV SOLN: 100.0000 mg | Freq: Every day | INTRAVENOUS | Status: DC

## 2019-09-30 MED ORDER — ZINC SULFATE 220 (50 ZN) MG PO CAPS
220.0000 mg | ORAL_CAPSULE | Freq: Every day | ORAL | Status: DC
  Administered 2019-09-30 – 2019-10-03 (×4): 220 mg via ORAL
  Filled 2019-09-30 (×4): qty 1

## 2019-09-30 MED ORDER — ONDANSETRON HCL 4 MG PO TABS: 4.0000 mg | ORAL_TABLET | Freq: Four times a day (QID) | ORAL | Status: DC | PRN

## 2019-09-30 MED ORDER — SODIUM CHLORIDE 0.9 % IV SOLN
INTRAVENOUS | Status: AC
  Administered 2019-09-30: 100 mg via INTRAVENOUS
  Filled 2019-09-30 (×2): qty 20

## 2019-09-30 MED ORDER — INSULIN ASPART 100 UNIT/ML ~~LOC~~ SOLN
0.0000 [IU] | Freq: Every day | SUBCUTANEOUS | Status: DC
  Administered 2019-09-30 – 2019-10-01 (×2): 5 [IU] via SUBCUTANEOUS

## 2019-09-30 MED ORDER — LOPERAMIDE HCL 2 MG PO CAPS: 2.0000 mg | ORAL_CAPSULE | ORAL | Status: DC | PRN

## 2019-09-30 MED ORDER — SODIUM CHLORIDE 0.9 % IV SOLN
100.0000 mg | Freq: Every day | INTRAVENOUS | Status: DC
  Administered 2019-09-30: 100 mg via INTRAVENOUS

## 2019-09-30 MED ORDER — ONDANSETRON HCL 4 MG/2ML IJ SOLN: 4.0000 mg | Freq: Four times a day (QID) | INTRAMUSCULAR | Status: DC | PRN

## 2019-09-30 MED ORDER — SODIUM CHLORIDE 0.9 % IV SOLN
200.0000 mg | Freq: Once | INTRAVENOUS | Status: DC
  Filled 2019-09-30: qty 40

## 2019-09-30 MED ORDER — ALBUTEROL SULFATE HFA 108 (90 BASE) MCG/ACT IN AERS
2.0000 | INHALATION_SPRAY | Freq: Four times a day (QID) | RESPIRATORY_TRACT | Status: DC
  Administered 2019-09-30: 2 via RESPIRATORY_TRACT
  Filled 2019-09-30 (×2): qty 6.7

## 2019-09-30 MED ORDER — ASCORBIC ACID 500 MG PO TABS
500.0000 mg | ORAL_TABLET | Freq: Every day | ORAL | Status: DC
  Administered 2019-09-30 – 2019-10-03 (×4): 500 mg via ORAL
  Filled 2019-09-30 (×4): qty 1

## 2019-09-30 MED ORDER — SODIUM CHLORIDE 0.9 % IV SOLN: 200.0000 mg | Freq: Once | INTRAVENOUS | Status: DC

## 2019-09-30 MED ORDER — INSULIN ASPART 100 UNIT/ML ~~LOC~~ SOLN
0.0000 [IU] | Freq: Three times a day (TID) | SUBCUTANEOUS | Status: DC
  Administered 2019-09-30 – 2019-10-01 (×2): 15 [IU] via SUBCUTANEOUS

## 2019-09-30 MED ORDER — ENOXAPARIN SODIUM 80 MG/0.8ML ~~LOC~~ SOLN
75.0000 mg | SUBCUTANEOUS | Status: DC
  Administered 2019-09-30 – 2019-10-02 (×3): 75 mg via SUBCUTANEOUS
  Filled 2019-09-30 (×3): qty 0.8

## 2019-09-30 MED ORDER — HYDRALAZINE HCL 20 MG/ML IJ SOLN
10.0000 mg | Freq: Four times a day (QID) | INTRAMUSCULAR | Status: DC | PRN
  Administered 2019-10-01 – 2019-10-02 (×4): 10 mg via INTRAVENOUS
  Filled 2019-09-30 (×4): qty 1

## 2019-09-30 MED ORDER — ACETAMINOPHEN 325 MG PO TABS: 650.0000 mg | ORAL_TABLET | Freq: Four times a day (QID) | ORAL | Status: DC | PRN

## 2019-09-30 MED ORDER — SODIUM CHLORIDE 0.9 % IV SOLN
100.0000 mg | Freq: Every day | INTRAVENOUS | Status: DC
  Administered 2019-10-01 – 2019-10-03 (×3): 100 mg via INTRAVENOUS
  Filled 2019-09-30 (×3): qty 20

## 2019-09-30 MED ORDER — METHYLPREDNISOLONE SODIUM SUCC 125 MG IJ SOLR
80.0000 mg | Freq: Two times a day (BID) | INTRAMUSCULAR | Status: DC
  Administered 2019-09-30 – 2019-10-02 (×4): 80 mg via INTRAVENOUS
  Filled 2019-09-30 (×4): qty 2

## 2019-09-30 NOTE — H&P (Signed)
History and Physical    Jason Bruce XHB:[id number] DOB: 18-Nov-1975 DOA: 09/29/2019  PCP: Patient, No Pcp Per  Patient coming from: [facility]  I have personally briefly reviewed patient's old medical records in [place]  Chief Complaint: Shortness of breath  HPI: Jason Bruce is a 44 y.o. male with medical history significant of hypertension, diabetes mellitus, morbid obesity presented to [place] with shortness of breath.  Patient tells me that he tested positive for Covid on 09/19/2019.  Reports that he had fever, cough, congestion, shortness of breath, loss of sense of taste and smell, diarrhea, decreased appetite, generalized weakness and lethargy.  He has been taking Tylenol with little to no help.  Patient tells me that he was not feeling well overall, feels like he is dehydrated, his mouth is dry, reports worsening of shortness of breath especially with exertion therefore he decided to go to the ER for further evaluation and management.  Denies headache, blurry vision, lightheadedness, dizziness, chest pain, abdominal pain, urinary changes, leg swelling, recent travel, immobilization, history of DVT/PE in the past.  No history of smoking, alcohol, listed drug use.  He is not vaccinated against COVID-19.  ED Course: Upon arrival to ED: Patient tachycardic, oxygen saturation in 90s requiring 2 L of oxygen via nasal cannula, afebrile with no leukocytosis, CMP shows AKI, AST: 51, ALT: 62, UA positive for bacteria, lipase: 76, A1c: 9.2, chest x-ray shows left basilar atelectasis/infiltrate.  Patient received remdesivir and Decadron in ED and transferred to [facility] for further management of COVID-19 pneumonia.  Review of Systems: As per HPI otherwise negative.    Past Medical History:  Diagnosis Date  . Hypertension   . Nephrolithiasis     History reviewed. No pertinent surgical history.   reports that he has never smoked. He has never used  smokeless tobacco. He reports that he does not drink alcohol and does not use drugs.  Allergies  Allergen Reactions  . Shellfish Allergy Anaphylaxis and Swelling    Family History  Problem Relation Age of Onset  . Cancer Mother   . Hypertension Other   . Diabetes Other     Prior to Admission medications   Medication Sig Start Date End Date Taking? Authorizing Provider  albuterol (VENTOLIN HFA) 108 (90 Base) MCG/ACT inhaler Inhale 2 puffs into the lungs every 4 (four) hours as needed for shortness of breath. 09/19/19  Yes [provider]  hydrochlorothiazide (HYDRODIURIL) 25 MG tablet Take 1 tablet (25 mg total) by mouth daily. 10/27/17  Yes Law, Alexandra M, PA-C  lisinopril (ZESTRIL) 10 MG tablet Take 10 mg by mouth daily. 07/20/19  Yes [provider]  metFORMIN (GLUCOPHAGE) 500 MG tablet Take 500 mg by mouth 2 (two) times daily with a meal.   Yes [provider]  Multiple Vitamin (MULTIVITAMIN WITH MINERALS) TABS tablet Take 1 tablet by mouth daily.   Yes [provider]  Potassium Citrate 15 MEQ (1620 MG) TBCR Take 30 mEq by mouth 2 (two) times daily. 09/02/19  Yes [provider]    Physical Exam: Vitals:   09/30/19 0833 09/30/19 1203 09/30/19 1600 09/30/19 1616  BP:  (!) 158/106 (!) 156/115   Pulse:  99 (!) 105   Resp:  20 20   Temp: 98.3 F (36.8 C) 98.1 F (36.7 C) 98.6 F (37 C)   TempSrc: Oral Oral Oral   SpO2:  96% 96%   Weight:    (!) 147.8 kg  Height:    5\' 9"  (1.753 m)    Constitutional: NAD, calm, comfortable, obese, on 2 L of oxygen via nasal cannula Eyes: PERRL, lids and conjunctivae normal ENMT: Mucous membranes are moist. Posterior pharynx clear of any exudate or lesions.Normal dentition.  Neck: normal, supple, no masses, no thyromegaly Respiratory: clear to auscultation bilaterally, no wheezing, no crackles. Normal respiratory effort. No accessory muscle use.  Cardiovascular: Regular rate and rhythm, no murmurs /  rubs / gallops. No extremity edema. 2+ pedal pulses. No carotid bruits.  Abdomen: no tenderness, no masses palpated. No hepatosplenomegaly. Bowel sounds positive.  Musculoskeletal: no clubbing / cyanosis. No joint deformity upper and lower extremities. Good ROM, no contractures. Normal muscle tone.  Skin: no rashes, lesions, ulcers. No induration Neurologic: CN 2-12 grossly intact. Sensation intact, DTR normal. Strength 5/5 in all 4.  Psychiatric: Normal judgment and insight. Alert and oriented x 3. Normal mood.    Labs on Admission: I have personally reviewed following labs and imaging studies  CBC: Recent Labs  Lab 09/29/19 1715  WBC 6.7  HGB 13.9  HCT 44.2  MCV 86.8  PLT 300   Basic Metabolic Panel: Recent Labs  Lab 09/29/19 1715  NA 136  K 3.5  CL 93*  CO2 26  GLUCOSE 267*  BUN 50*  CREATININE 1.60*  CALCIUM 9.0   GFR: Estimated Creatinine Clearance: 83.7 mL/min (A) (by C-G formula based on SCr of 1.6 mg/dL (H)). Liver Function Tests: Recent Labs  Lab 09/29/19 1715  AST 51*  ALT 62*  ALKPHOS 61  BILITOT 0.7  PROT 9.3*  ALBUMIN 4.0   Recent Labs  Lab 09/29/19 1715  LIPASE 76*   No results for input(s): AMMONIA in the last 168 hours. Coagulation Profile: No results for input(s): INR, PROTIME in the last 168 hours. Cardiac Enzymes: No results for input(s): CKTOTAL, CKMB, CKMBINDEX, TROPONINI in the last 168 hours. BNP (last 3 results) No results for input(s): PROBNP in the last 8760 hours. HbA1C: Recent Labs    09/30/19 0425  HGBA1C 9.2*   CBG: No results for input(s): GLUCAP in the last 168 hours. Lipid Profile: No results for input(s): CHOL, HDL, LDLCALC, TRIG, CHOLHDL, LDLDIRECT in the last 72 hours. Thyroid Function Tests: No results for input(s): TSH, T4TOTAL, FREET4, T3FREE, THYROIDAB in the last 72 hours. Anemia Panel: No results for input(s): VITAMINB12, FOLATE, FERRITIN, TIBC, IRON, RETICCTPCT in the last 72 hours. Urine analysis:     Component Value Date/Time   COLORURINE YELLOW 09/29/2019 2322   APPEARANCEUR CLEAR 09/29/2019 2322   APPEARANCEUR Cloudy 08/11/2011 2105   LABSPEC >1.030 (H) 09/29/2019 2322   LABSPEC 1.016 08/11/2011 2105   PHURINE 5.0 09/29/2019 2322   GLUCOSEU NEGATIVE 09/29/2019 2322   GLUCOSEU Negative 08/11/2011 2105   HGBUR NEGATIVE 09/29/2019 [place] NEGATIVE 09/29/2019 2322   BILIRUBINUR Negative 08/11/2011 2105   [place] 09/29/2019 2322   PROTEINUR 100 (A) 09/29/2019 2322   NITRITE NEGATIVE 09/29/2019 2322   LEUKOCYTESUR NEGATIVE 09/29/2019 2322   LEUKOCYTESUR Negative 08/11/2011 2105    Radiological Exams on Admission: DG Chest Portable 1 View  Result Date: 09/29/2019 CLINICAL DATA:  Cough with shortness of breath. COVID positive. EXAM: PORTABLE CHEST 1 VIEW COMPARISON:  September 22, 2019 FINDINGS: Mildly decreased lung volumes are seen which is likely secondary to the degree of patient inspiration. Mild linear atelectasis is seen within the right perihilar region, mid left lung and left lung base. Mild slightly patchy appearing left basilar atelectasis  and/or infiltrate is also seen. There is no evidence of a pleural effusion or pneumothorax. The heart size and mediastinal contours are within normal limits. The visualized skeletal structures are unremarkable. IMPRESSION: Mild slightly patchy appearing left basilar atelectasis and/or infiltrate. Electronically Signed   By: Virgina Norfolk M.D.   On: 09/29/2019 17:55     Assessment/Plan Principal Problem:   Acute hypoxemic respiratory failure due to COVID-19 [facility]) Active Problems:   Pneumonia due to COVID-19 virus   Hypertension   DM (diabetes mellitus) (HCC)   Elevated liver enzymes   Bacteriuria   AKI (acute kidney injury) ([place])    Acute hypoxemic respiratory failure due to COVID-19 infection: -Patient has symptoms since last 12 days.  He tested positive for COVID-19 on 09/19/2019. -He is tachycardic and  hypoxic requiring 2 L of oxygen via nasal cannula. -Admit patient on the floor.  On continuous pulse ox.  We will try to wean off of oxygen as tolerated. -Remdesivir per pharmacy, start on Solu-Medrol. -Check inflammatory markers. -P.o. vitamins, antitussive, albuterol every 6 hours -Patient was told that if COVID-19 pneumonitis gets worse we might potentially use Actemra off label, he denies any known history of tuberculosis or hepatitis, understands risk and benefits and wants to proceed with Actemra treatment if required. -Zofran as needed for nausea and vomiting, Imodium as needed for loose stool.  Hypertension: -Hold lisinopril and HCTZ due to AKI. -Hydralazine as needed for blood pressure more than 160/100 -Resume home meds once AKI resolved.  AKI: -Received IV fluids in ED. -Hold nephrotoxic medication.  Repeat BMP tomorrow a.m.  Elevated liver enzymes: AST: 51, ALT: 62 -Likely secondary to COVID-19 infection -Repeat CMP tomorrow a.m.  Asymptomatic bacteriuria: Patient denies any urinary symptoms. -He is afebrile.  No indication of antibiotics at this time  Uncontrolled type 2 diabetes mellitus: A1c: 9.2%.  Hold Metformin.  Started patient on sliding scale insulin  Obesity with BMI of 48: Diet/exercise/weight loss recommended.  DVT prophylaxis: Lovenox/SCD Code Status: Full code Family Communication: None present at bedside.  Plan of care discussed with patient in length and he verbalized understanding and agreed with it. Disposition Plan: Home in 3 to 4 days Consults called: None Admission status: Inpatient   Mckinley Jewel MD [facility]  If 7PM-7AM, please contact night-coverage [email or web address] Password [facility]  09/30/2019, 4:35 PM

## 2019-09-30 NOTE — Progress Notes (Signed)
Pharmacy Antibiotic Note  Jason Bruce is a 44 y.o. male admitted on 09/29/2019  on transfer from [place] with [place].  Pharmacy has been consulted for remdesivir dosing.  WBC 6.7, afebrile, AST/ALT 51/62  Plan: Remdesivir 200 mg IV X 1 (already rec'd), followed by 100 mg IV daily X 4 days Monitor LFTs daily while on remdesivir  Height: 5\' 9"  (175.3 cm) Weight: (!) 147.8 kg (325 lb 12.8 oz) IBW/kg (Calculated) : 70.7  Temp (24hrs), Avg:98.2 F (36.8 C), Min:97.8 F (36.6 C), Max:98.6 F (37 C)  Recent Labs  Lab 09/29/19 1715  WBC 6.7  CREATININE 1.60*    Estimated Creatinine Clearance: 83.7 mL/min (A) (by C-G formula based on SCr of 1.6 mg/dL (H)).    Allergies  Allergen Reactions  . Shellfish Allergy Anaphylaxis and Swelling    Microbiology results: 8/18 Urine cx: pending  Thank you for allowing pharmacy to be a part of this patient's care.  Gillermina Hu, PharmD, BCPS, [facility] Clinical Pharmacist 09/30/2019 4:54 PM

## 2019-09-30 NOTE — ED Notes (Signed)
Pt given mac and cheese , sitting on side of bed to eat lunch

## 2019-09-30 NOTE — ED Notes (Signed)
Pt requesting something to drink. Ok per Dr Florina Ou pt given ice water, orange juice and cranberry juice

## 2019-09-30 NOTE — Progress Notes (Signed)
RT instructed patient on incentive spirometer and flutter valve. Patient able to demonstrate back good technique and can reach 750 mL using the IS.

## 2019-09-30 NOTE — ED Notes (Signed)
Report to [place] at [facility] 5 W

## 2019-09-30 NOTE — Progress Notes (Signed)
Placed patient on 2 liter nasal cannula due to patient's SPO2 dropping to 84%.  Patient's SPO2 increased to 95%.  RT will continue to monitor.

## 2019-09-30 NOTE — Progress Notes (Signed)
Pt requesting to take a shower. Is able to ambulate independently. NSR on tele and o2 sats at 98 on 3L oxygen via [place]. Have contacted MD about the above and was granted orders for shower off of tele. HE NEEDS TO WEAR OXYGEN DURING HIS SHOWER. Pt would like to take shower in AM. Will continue to monitor.

## 2019-09-30 NOTE — ED Notes (Signed)
[facility]  Here to take pt to [facility]

## 2019-10-01 LAB — COMPREHENSIVE METABOLIC PANEL
ALT: 46 U/L — ABNORMAL HIGH (ref 0–44)
AST: 31 U/L (ref 15–41)
Albumin: 3.2 g/dL — ABNORMAL LOW (ref 3.5–5.0)
Alkaline Phosphatase: 55 U/L (ref 38–126)
Anion gap: 13 (ref 5–15)
BUN: 27 mg/dL — ABNORMAL HIGH (ref 6–20)
CO2: 26 mmol/L (ref 22–32)
Calcium: 8.6 mg/dL — ABNORMAL LOW (ref 8.9–10.3)
Chloride: 94 mmol/L — ABNORMAL LOW (ref 98–111)
Creatinine, Ser: 1.4 mg/dL — ABNORMAL HIGH (ref 0.61–1.24)
GFR calc Af Amer: >60 mL/min (ref >60)
GFR calc non Af Amer: >60 mL/min (ref >60)
Glucose, Bld: 529 mg/dL (ref 70–99)
Potassium: 4 mmol/L (ref 3.5–5.1)
Sodium: 133 mmol/L — ABNORMAL LOW (ref 135–145)
Total Bilirubin: 0.9 mg/dL (ref 0.3–1.2)
Total Protein: 7.4 g/dL (ref 6.5–8.1)

## 2019-10-01 LAB — CBC WITH DIFFERENTIAL/PLATELET
Abs Immature Granulocytes: 0.11 10*3/uL — ABNORMAL HIGH (ref 0.00–0.07)
Basophils Absolute: 0 10*3/uL (ref 0.0–0.1)
Basophils Relative: 0 %
Eosinophils Absolute: 0 10*3/uL (ref 0.0–0.5)
Eosinophils Relative: 0 %
HCT: 36.8 % — ABNORMAL LOW (ref 39.0–52.0)
Hemoglobin: 11.2 g/dL — ABNORMAL LOW (ref 13.0–17.0)
Immature Granulocytes: 2 %
Lymphocytes Relative: 11 %
Lymphs Abs: 0.7 10*3/uL (ref 0.7–4.0)
MCH: 26.7 pg (ref 26.0–34.0)
MCHC: 30.4 g/dL (ref 30.0–36.0)
MCV: 87.6 fL (ref 80.0–100.0)
Monocytes Absolute: 0.3 10*3/uL (ref 0.1–1.0)
Monocytes Relative: 4 %
Neutro Abs: 5.5 10*3/uL (ref 1.7–7.7)
Neutrophils Relative %: 83 %
Platelets: 391 10*3/uL (ref 150–400)
RBC: 4.2 MIL/uL — ABNORMAL LOW (ref 4.22–5.81)
RDW: 14 % (ref 11.5–15.5)
WBC: 6.6 10*3/uL (ref 4.0–10.5)
nRBC: 0 % (ref 0.0–0.2)

## 2019-10-01 LAB — FERRITIN: Ferritin: 656 ng/mL — ABNORMAL HIGH (ref 24–336)

## 2019-10-01 LAB — GLUCOSE, CAPILLARY
Glucose-Capillary: 359 mg/dL — ABNORMAL HIGH (ref 70–99)
Glucose-Capillary: 482 mg/dL — ABNORMAL HIGH (ref 70–99)
Glucose-Capillary: 464 mg/dL — ABNORMAL HIGH (ref 70–99)
Glucose-Capillary: 391 mg/dL — ABNORMAL HIGH (ref 70–99)

## 2019-10-01 LAB — URINE CULTURE: Culture: NO GROWTH

## 2019-10-01 LAB — PHOSPHORUS: Phosphorus: 2 mg/dL — ABNORMAL LOW (ref 2.5–4.6)

## 2019-10-01 LAB — MAGNESIUM: Magnesium: 2.1 mg/dL (ref 1.7–2.4)

## 2019-10-01 LAB — C-REACTIVE PROTEIN: CRP: 2 mg/dL — ABNORMAL HIGH (ref <1.0)

## 2019-10-01 LAB — D-DIMER, QUANTITATIVE: D-Dimer, Quant: 0.46 ug/mL-FEU (ref 0.00–0.50)

## 2019-10-01 MED ORDER — CHLORPROMAZINE HCL 10 MG PO TABS
10.0000 mg | ORAL_TABLET | Freq: Once | ORAL | Status: AC
  Administered 2019-10-01: 10 mg via ORAL
  Filled 2019-10-01: qty 1

## 2019-10-01 MED ORDER — INSULIN ASPART 100 UNIT/ML ~~LOC~~ SOLN
0.0000 [IU] | Freq: Three times a day (TID) | SUBCUTANEOUS | Status: DC
  Administered 2019-10-01 – 2019-10-02 (×3): 20 [IU] via SUBCUTANEOUS
  Administered 2019-10-03: 11 [IU] via SUBCUTANEOUS
  Administered 2019-10-03: 4 [IU] via SUBCUTANEOUS

## 2019-10-01 MED ORDER — INSULIN ASPART 100 UNIT/ML ~~LOC~~ SOLN
4.0000 [IU] | Freq: Three times a day (TID) | SUBCUTANEOUS | Status: DC
  Administered 2019-10-01 (×2): 4 [IU] via SUBCUTANEOUS

## 2019-10-01 MED ORDER — INSULIN GLARGINE 100 UNIT/ML ~~LOC~~ SOLN
15.0000 [IU] | Freq: Every day | SUBCUTANEOUS | Status: DC
  Administered 2019-10-01: 15 [IU] via SUBCUTANEOUS
  Filled 2019-10-01 (×3): qty 0.15

## 2019-10-01 MED ORDER — INSULIN ASPART 100 UNIT/ML ~~LOC~~ SOLN: 0.0000 [IU] | Freq: Every day | SUBCUTANEOUS | Status: DC

## 2019-10-01 MED ORDER — INSULIN ASPART 100 UNIT/ML ~~LOC~~ SOLN
25.0000 [IU] | Freq: Once | SUBCUTANEOUS | Status: AC
  Administered 2019-10-01: 25 [IU] via SUBCUTANEOUS

## 2019-10-01 MED ORDER — ALBUTEROL SULFATE HFA 108 (90 BASE) MCG/ACT IN AERS
2.0000 | INHALATION_SPRAY | Freq: Three times a day (TID) | RESPIRATORY_TRACT | Status: DC
  Administered 2019-10-01 – 2019-10-03 (×8): 2 via RESPIRATORY_TRACT

## 2019-10-01 NOTE — Progress Notes (Addendum)
Inpatient Diabetes Program Recommendations  AACE/ADA: New Consensus Statement on Inpatient Glycemic Control (2015)  Target Ranges:  Prepandial:   less than 140 mg/dL      Peak postprandial:   less than 180 mg/dL (1-2 hours)      Critically ill patients:  140 - 180 mg/dL   Lab Results  Component Value Date   GLUCAP 482 (H) 10/01/2019   HGBA1C 9.2 (H) 09/30/2019    Review of Glycemic Control  Diabetes history: DM 2 Outpatient Diabetes medications: Metformin 500 mg bid Current orders for Inpatient glycemic control:  Lantus 15 units Daily Novolog 0-20 units tid + hs Novolog 4 units tid meal coverage  Solumedrol 80 mg Q12 hours A1c 9.2% on 8/19  Inpatient Diabetes Program Recommendations:    Based on COVID glycemic control order set based on current glucose trends and real function consider:  -  Levemir 15 units bid (noted Lantus 15 units Q24 hours ordered)  -  Add Tradjenta 5 mg Daily  Thanks,  Tama Headings RN, MSN, BC-ADM Inpatient Diabetes Coordinator Team Pager [phone] (8a-5p)

## 2019-10-01 NOTE — Progress Notes (Signed)
PROGRESS NOTE                                                                                                                                                                                                             Patient Demographics:    Jason Bruce, is a 44 y.o. male, DOB - 02/25/1975, XLK:[id number]  Outpatient Primary MD for the patient is Patient, No Pcp Per   Admit date - 09/29/2019   LOS - 1  Chief Complaint  Patient presents with  . Shortness of Breath       Brief Narrative: Patient is a 44 y.o. male with PMHx of DM-2, HTN, morbid obesity-who presented with shortness of breath-he was found to have hypoxia secondary to COVID-19 pneumonia.  Per H&P-patient tested positive for COVID-19 on 8/8.  COVID-19 vaccinated status: Unvaccinated  Significant Events: 8/18>> presented to med [facility] with shortness of breath-hypoxic-secondary to COVID-19.  Significant studies: 8/18>>Chest x-ray: Mild patchy appearing left basilar atelectasis/infiltrate.  COVID-19 medications: Steroids: 8/18>> Remdesivir: 8/18>>  Antibiotics: None  Microbiology data: 8/18 >> urine culture: No growth  Procedures: None  Consults: None  DVT prophylaxis: SCDs Start: 09/30/19 1630 Prophylactic Lovenox    Subjective:    Jason Bruce today feels better-Down to 2 L of oxygen.  Appears very comfortable.   Assessment  & Plan :   Acute Hypoxic Resp Failure due to Covid 19 Viral pneumonia: Improving-mild hypoxia-Down to 2 L of oxygen-attempt to titrate off oxygen.  Continue steroids/remdesivir.  If clinical improvement continues-we can tentatively plan for discharge home on 8/21 to complete Remdesivir in the infusion center.  Fever: afebrile O2 requirements:  SpO2: 93 % O2 Flow Rate (L/min): 3 L/min   COVID-19 Labs: Recent Labs    09/30/19 0424 09/30/19 0425 09/30/19 2002 10/01/19 1018  DDIMER 0.60*  --    --  0.46  FERRITIN  --   --  830*  --   LDH 248*  --   --   --   CRP  --  4.7*  --   --        Component Value Date/Time   BNP 23.0 09/30/2019 2002    Recent Labs  Lab 09/30/19 0424  PROCALCITON <0.10    No results found for: SARSCOV2NAA    Prone/Incentive Spirometry: encouraged  incentive spirometry use 3-4/hour.  AKI: Likely hemodynamically mediated-follow labs-pending this morning.  Avoid nephrotoxic agents.  Transaminitis: Likely secondary to COVID-19-follow-pending labs this morning.  HTN: BP controlled-not on any antihypertensives-resume when able.  DM-2 (A1c 9.2 on 8/19) with uncontrolled hyperglycemia secondary to steroids: CBGs uncontrolled-add Lantus 15 units daily, 4 units of NovoLog, change to resistant SSI.  Follow and optimize.  Recent Labs    09/30/19 1648 09/30/19 2112 10/01/19 0759  GLUCAP 382* 379* 359*    Morbid Obesity: Estimated body mass index is 48.11 kg/m as calculated from the following:   Height as of this encounter: 5\' 9"  (1.753 m).   Weight as of this encounter: 147.8 kg.    ABG: No results found for: PHART, PCO2ART, PO2ART, HCO3, TCO2, ACIDBASEDEF, O2SAT  Vent Settings: N/A  Condition - Stable  Family Communication  :  Spouse updated over the phone (spouse present [facility]-she was face timing husband when I was in the room)  Code Status :  Full Code  Diet :  Diet Order            Diet 2 gram sodium Room service appropriate? Yes; Fluid consistency: Thin  Diet effective now                  Disposition Plan  :   Status is: Inpatient  Remains inpatient appropriate because:Inpatient level of care appropriate due to severity of illness   Dispo: The patient is from: Home              Anticipated d/c is to: Home              Anticipated d/c date is: 2 days              Patient currently is not medically stable to d/c.   Barriers to discharge: Hypoxia requiring O2 supplementation/complete 5 days of  IV Remdesivir  Antimicorbials  :    Anti-infectives (From admission, onward)   Start     Dose/Rate Route Frequency Ordered Stop   10/01/19 1000  remdesivir 100 mg in sodium chloride 0.9 % 100 mL IVPB  Status:  Discontinued       "Followed by" Linked Group Details   100 mg 200 mL/hr over 30 Minutes Intravenous Daily 09/30/19 0328 09/30/19 1319   10/01/19 1000  remdesivir 100 mg in sodium chloride 0.9 % 100 mL IVPB        100 mg 200 mL/hr over 30 Minutes Intravenous Daily 09/30/19 1319 10/05/19 0959   10/01/19 1000  remdesivir 100 mg in sodium chloride 0.9 % 100 mL IVPB  Status:  Discontinued       "Followed by" Linked Group Details   100 mg 200 mL/hr over 30 Minutes Intravenous Daily 09/30/19 1631 09/30/19 1646   09/30/19 1645  remdesivir 200 mg in sodium chloride 0.9% 250 mL IVPB  Status:  Discontinued       "Followed by" Linked Group Details   200 mg 580 mL/hr over 30 Minutes Intravenous Once 09/30/19 1631 09/30/19 1646   09/30/19 0430  remdesivir 200 mg in sodium chloride 0.9% 250 mL IVPB  Status:  Discontinued       "Followed by" Linked Group Details   200 mg 580 mL/hr over 30 Minutes Intravenous Once 09/30/19 0328 09/30/19 1319      Inpatient Medications  Scheduled Meds: . albuterol  2 puff Inhalation TID  . vitamin C  500 mg Oral Daily  . enoxaparin (LOVENOX) injection  75 mg Subcutaneous Q24H  .  insulin aspart  0-15 Units Subcutaneous TID WC  . insulin aspart  0-5 Units Subcutaneous QHS  . methylPREDNISolone (SOLU-MEDROL) injection  80 mg Intravenous Q12H  . zinc sulfate  220 mg Oral Daily   Continuous Infusions: . remdesivir 100 mg in NS 100 mL 100 mg (10/01/19 0844)   PRN Meds:.acetaminophen, chlorpheniramine-HYDROcodone, guaiFENesin-dextromethorphan, hydrALAZINE, loperamide, ondansetron **OR** ondansetron (ZOFRAN) IV   Time Spent in minutes  25  See all Orders from today for further details   Oren Binet M.D on 10/01/2019 at 11:22 AM  To page go to  [email or web address] - use universal password  [facility] -  Office  [phone]    Objective:   Vitals:   10/01/19 0513 10/01/19 0618 10/01/19 0621 10/01/19 0735  BP: (!) 147/104 (!) 159/112 (!) 174/110 (!) 146/83  Pulse: 97 94 92 98  Resp: (!) 27 20 (!) 21 (!) 21  Temp:      TempSrc:      SpO2: 95% 95% 94% 93%  Weight:      Height:        Wt Readings from Last 3 Encounters:  09/30/19 (!) 147.8 kg  01/12/19 (!) 157.7 kg  11/25/18 (!) 153.8 kg     Intake/Output Summary (Last 24 hours) at 10/01/2019 1122 Last data filed at 10/01/2019 0426 Gross per 24 hour  Intake 480 ml  Output --  Net 480 ml     Physical Exam Gen Exam:Alert awake-not in any distress HEENT:atraumatic, normocephalic Chest: B/L clear to auscultation anteriorly CVS:S1S2 regular Abdomen:soft non tender, non distended Extremities:no edema Neurology: Non focal Skin: no rash   Data Review:    CBC Recent Labs  Lab 09/29/19 1715 09/30/19 2002 10/01/19 1018  WBC 6.7 6.1 6.6  HGB 13.9 12.6* 11.2*  HCT 44.2 41.2 36.8*  PLT 386 419* 391  MCV 86.8 88.4 87.6  MCH 27.3 27.0 26.7  MCHC 31.4 30.6 30.4  RDW 14.5 14.3 14.0  LYMPHSABS  --   --  0.7  MONOABS  --   --  0.3  EOSABS  --   --  0.0  BASOSABS  --   --  0.0    Chemistries  Recent Labs  Lab 09/29/19 1715 09/30/19 2002  NA 136  --   K 3.5  --   CL 93*  --   CO2 26  --   GLUCOSE 267*  --   BUN 50*  --   CREATININE 1.60* 1.70*  CALCIUM 9.0  --   AST 51*  --   ALT 62*  --   ALKPHOS 61  --   BILITOT 0.7  --    ------------------------------------------------------------------------------------------------------------------ No results for input(s): CHOL, HDL, LDLCALC, TRIG, CHOLHDL, LDLDIRECT in the last 72 hours.  Lab Results  Component Value Date   HGBA1C 9.2 (H) 09/30/2019   ------------------------------------------------------------------------------------------------------------------ No results for input(s): TSH,  T4TOTAL, T3FREE, THYROIDAB in the last 72 hours.  Invalid input(s): FREET3 ------------------------------------------------------------------------------------------------------------------ Recent Labs    09/30/19 2002  FERRITIN 830*    Coagulation profile No results for input(s): INR, PROTIME in the last 168 hours.  Recent Labs    09/30/19 0424 10/01/19 1018  DDIMER 0.60* 0.46    Cardiac Enzymes No results for input(s): CKMB, TROPONINI, MYOGLOBIN in the last 168 hours.  Invalid input(s): CK ------------------------------------------------------------------------------------------------------------------    Component Value Date/Time   BNP 23.0 09/30/2019 2002    Micro Results Recent Results (from the past 240 hour(s))  Urine culture     Status: None  Collection Time: 09/29/19 11:22 PM   Specimen: Urine, Clean Catch  Result Value Ref Range Status   Specimen Description   Final    URINE, CLEAN CATCH Performed at [facility], [address] [place], [place] [address]    Special Requests   Final    NONE Performed at [facility], [address] [place], [place] [address]    Culture   Final    NO GROWTH Performed at [facility], [place] [address], [place], [place] [address]    Report Status 10/01/2019 FINAL  Final    Radiology Reports DG Chest Portable 1 View  Result Date: 09/29/2019 CLINICAL DATA:  Cough with shortness of breath. COVID positive. EXAM: PORTABLE CHEST 1 VIEW COMPARISON:  September 22, 2019 FINDINGS: Mildly decreased lung volumes are seen which is likely secondary to the degree of patient inspiration. Mild linear atelectasis is seen within the right perihilar region, mid left lung and left lung base. Mild slightly patchy appearing left basilar atelectasis and/or infiltrate is also seen. There is no evidence of a pleural effusion or pneumothorax. The heart size and mediastinal contours are within normal limits. The visualized  skeletal structures are unremarkable. IMPRESSION: Mild slightly patchy appearing left basilar atelectasis and/or infiltrate. Electronically Signed   By: Virgina Norfolk M.D.   On: 09/29/2019 17:55

## 2019-10-01 NOTE — TOC Initial Note (Signed)
Transition of Care [facility]) - Initial/Assessment Note    Patient Details  Name: Jason Bruce MRN: [id number] Date of Birth: July 13, 1975  Transition of Care [facility]) CM/SW Contact:    Verdell Carmine, RN Phone Number: 10/01/2019, 5:12 PM  Clinical Narrative:                 Patient admitted with respiratory failure due to [place] -19 Patient is uninsured. Appointment made with community Health for first available, which was September 15th.  CM will follow for needs, will need financial counseling to review case.   Expected Discharge Plan: Home/Self Care Barriers to Discharge: Continued Medical Work up   Patient Goals and CMS Choice        Expected Discharge Plan and Services Expected Discharge Plan: Home/Self Care                                              Prior Living Arrangements/Services     Patient language and need for interpreter reviewed:: Yes Do you feel safe going back to the place where you live?: Yes      Need for Family Participation in Patient Care: Yes (Comment) Care giver support system in place?: Yes (comment)   Criminal Activity/Legal Involvement Pertinent to Current Situation/Hospitalization: No - Comment as needed  Activities of Daily Living      Permission Sought/Granted                  Emotional Assessment       Orientation: : Oriented to Self, Oriented to Place, Oriented to  Time, Oriented to Situation Alcohol / Substance Use: Not Applicable Psych Involvement: No (comment)  Admission diagnosis:  Dehydration [E86.0] Hyperglycemia [R73.9] Nausea and vomiting in adult [R11.2] Pneumonia due to COVID-19 virus [U07.1, J12.82] Acute hypoxemic respiratory failure due to COVID-19 ([place]) [U07.1, J96.01] Patient Active Problem List   Diagnosis Date Noted  . Pneumonia due to COVID-19 virus 09/30/2019  . Acute hypoxemic respiratory failure due to COVID-19 ([place]) 09/30/2019  . Hypertension   . DM (diabetes mellitus) ([place])   .  Elevated liver enzymes   . Bacteriuria   . AKI (acute kidney injury) [facility])    PCP:  Patient, No Pcp Per Pharmacy:   [facility] [place], [address] [place] [address] Phone: [phone] Fax: [phone]     Social Determinants of Health (SDOH) Interventions    Readmission Risk Interventions No flowsheet data found.

## 2019-10-01 NOTE — Progress Notes (Signed)
CRITICAL VALUE ALERT   Critical Value:  Glucose 529   Date & Time Notied:  10/01/2019 1139  Provider Notified: Dr.Ghimire notified   Orders Received/Actions taken: checked pts CBG which was 482, will give scheduled insulin

## 2019-10-01 NOTE — Progress Notes (Signed)
   10/01/19 2104  Assess: MEWS Score  Temp 98.5 F (36.9 C)  BP (!) 150/86  Pulse Rate 98  ECG Heart Rate 97  Resp 19  Level of Consciousness Alert  SpO2 92 %  O2 Device Room Air  Patient Activity (if Appropriate) In bed  Assess: MEWS Score  MEWS Temp 0  MEWS Systolic 0  MEWS Pulse 0  MEWS RR 0  MEWS LOC 0  MEWS Score 0  MEWS Score Color Green  Assess: if the MEWS score is Yellow or Red  Were vital signs taken at a resting state? Yes  Focused Assessment No change from prior assessment  Early Detection of Sepsis Score *See Row Information* Low  MEWS guidelines implemented *See Row Information* No, other (Comment) (no acute changes)  Treat  Pain Scale 0-10  Pain Score 0  Document  Progress note created (see row info) Yes

## 2019-10-01 NOTE — Progress Notes (Signed)
Blood sugar 464 Dr. Sloan Leiter notified verbal order for 25 units of novolog once.

## 2019-10-01 NOTE — Progress Notes (Signed)
During assessment, pt noted to have hiccups.  Pt states he has had them "off and on since yesterday."  Paged [facility] on-call provider B. Chotiner, who ordered thorazine 10mg  PO x1.

## 2019-10-02 DIAGNOSIS — R748 Abnormal levels of other serum enzymes: Secondary | ICD-10-CM

## 2019-10-02 LAB — CBC WITH DIFFERENTIAL/PLATELET
Abs Immature Granulocytes: 0.26 10*3/uL — ABNORMAL HIGH (ref 0.00–0.07)
Basophils Absolute: 0 10*3/uL (ref 0.0–0.1)
Basophils Relative: 0 %
Eosinophils Absolute: 0 10*3/uL (ref 0.0–0.5)
Eosinophils Relative: 0 %
HCT: 39.2 % (ref 39.0–52.0)
Hemoglobin: 12.6 g/dL — ABNORMAL LOW (ref 13.0–17.0)
Immature Granulocytes: 3 %
Lymphocytes Relative: 11 %
Lymphs Abs: 1.2 10*3/uL (ref 0.7–4.0)
MCH: 27.8 pg (ref 26.0–34.0)
MCHC: 32.1 g/dL (ref 30.0–36.0)
MCV: 86.5 fL (ref 80.0–100.0)
Monocytes Absolute: 0.4 10*3/uL (ref 0.1–1.0)
Monocytes Relative: 4 %
Neutro Abs: 8.4 10*3/uL — ABNORMAL HIGH (ref 1.7–7.7)
Neutrophils Relative %: 82 %
Platelets: 464 10*3/uL — ABNORMAL HIGH (ref 150–400)
RBC: 4.53 MIL/uL (ref 4.22–5.81)
RDW: 13.9 % (ref 11.5–15.5)
WBC: 10.2 10*3/uL (ref 4.0–10.5)
nRBC: 0 % (ref 0.0–0.2)

## 2019-10-02 LAB — COMPREHENSIVE METABOLIC PANEL
ALT: 72 U/L — ABNORMAL HIGH (ref 0–44)
AST: 67 U/L — ABNORMAL HIGH (ref 15–41)
Albumin: 3.4 g/dL — ABNORMAL LOW (ref 3.5–5.0)
Alkaline Phosphatase: 56 U/L (ref 38–126)
Anion gap: 13 (ref 5–15)
BUN: 25 mg/dL — ABNORMAL HIGH (ref 6–20)
CO2: 25 mmol/L (ref 22–32)
Calcium: 9.2 mg/dL (ref 8.9–10.3)
Chloride: 95 mmol/L — ABNORMAL LOW (ref 98–111)
Creatinine, Ser: 1.26 mg/dL — ABNORMAL HIGH (ref 0.61–1.24)
GFR calc Af Amer: >60 mL/min (ref >60)
GFR calc non Af Amer: >60 mL/min (ref >60)
Glucose, Bld: 465 mg/dL — ABNORMAL HIGH (ref 70–99)
Potassium: 4.3 mmol/L (ref 3.5–5.1)
Sodium: 133 mmol/L — ABNORMAL LOW (ref 135–145)
Total Bilirubin: 0.7 mg/dL (ref 0.3–1.2)
Total Protein: 7.9 g/dL (ref 6.5–8.1)

## 2019-10-02 LAB — GLUCOSE, CAPILLARY
Glucose-Capillary: 383 mg/dL — ABNORMAL HIGH (ref 70–99)
Glucose-Capillary: 492 mg/dL — ABNORMAL HIGH (ref 70–99)
Glucose-Capillary: 480 mg/dL — ABNORMAL HIGH (ref 70–99)
Glucose-Capillary: 457 mg/dL — ABNORMAL HIGH (ref 70–99)
Glucose-Capillary: 428 mg/dL — ABNORMAL HIGH (ref 70–99)

## 2019-10-02 LAB — FERRITIN: Ferritin: 700 ng/mL — ABNORMAL HIGH (ref 24–336)

## 2019-10-02 LAB — D-DIMER, QUANTITATIVE: D-Dimer, Quant: 0.4 ug/mL-FEU (ref 0.00–0.50)

## 2019-10-02 LAB — C-REACTIVE PROTEIN: CRP: 1.5 mg/dL — ABNORMAL HIGH (ref <1.0)

## 2019-10-02 LAB — MAGNESIUM: Magnesium: 2 mg/dL (ref 1.7–2.4)

## 2019-10-02 LAB — PHOSPHORUS: Phosphorus: 2.2 mg/dL — ABNORMAL LOW (ref 2.5–4.6)

## 2019-10-02 MED ORDER — INSULIN ASPART 100 UNIT/ML ~~LOC~~ SOLN
22.0000 [IU] | Freq: Once | SUBCUTANEOUS | Status: AC
  Administered 2019-10-02: 22 [IU] via SUBCUTANEOUS

## 2019-10-02 MED ORDER — METHYLPREDNISOLONE SODIUM SUCC 40 MG IJ SOLR: 40.0000 mg | Freq: Two times a day (BID) | INTRAMUSCULAR | Status: DC

## 2019-10-02 MED ORDER — INSULIN ASPART 100 UNIT/ML ~~LOC~~ SOLN
8.0000 [IU] | Freq: Once | SUBCUTANEOUS | Status: AC
  Administered 2019-10-02: 8 [IU] via SUBCUTANEOUS

## 2019-10-02 MED ORDER — LINAGLIPTIN 5 MG PO TABS
5.0000 mg | ORAL_TABLET | Freq: Every day | ORAL | Status: DC
  Administered 2019-10-02 – 2019-10-03 (×2): 5 mg via ORAL
  Filled 2019-10-02 (×2): qty 1

## 2019-10-02 MED ORDER — INSULIN GLARGINE 100 UNIT/ML ~~LOC~~ SOLN: 25.0000 [IU] | Freq: Every day | SUBCUTANEOUS | Status: DC

## 2019-10-02 MED ORDER — INSULIN ASPART 100 UNIT/ML ~~LOC~~ SOLN
8.0000 [IU] | Freq: Three times a day (TID) | SUBCUTANEOUS | Status: DC
  Administered 2019-10-02 – 2019-10-03 (×4): 8 [IU] via SUBCUTANEOUS

## 2019-10-02 MED ORDER — INSULIN ASPART 100 UNIT/ML ~~LOC~~ SOLN
12.0000 [IU] | Freq: Once | SUBCUTANEOUS | Status: AC
  Administered 2019-10-02: 12 [IU] via SUBCUTANEOUS

## 2019-10-02 MED ORDER — INSULIN GLARGINE 100 UNIT/ML ~~LOC~~ SOLN
30.0000 [IU] | Freq: Every day | SUBCUTANEOUS | Status: DC
  Administered 2019-10-02 – 2019-10-03 (×2): 30 [IU] via SUBCUTANEOUS
  Filled 2019-10-02 (×3): qty 0.3

## 2019-10-02 MED ORDER — METHYLPREDNISOLONE SODIUM SUCC 125 MG IJ SOLR: 60.0000 mg | Freq: Two times a day (BID) | INTRAMUSCULAR | Status: DC

## 2019-10-02 MED ORDER — INSULIN ASPART 100 UNIT/ML ~~LOC~~ SOLN
10.0000 [IU] | Freq: Once | SUBCUTANEOUS | Status: AC
  Administered 2019-10-02: 10 [IU] via SUBCUTANEOUS

## 2019-10-02 NOTE — Progress Notes (Signed)
PROGRESS NOTE                                                                                                                                                                                                             Patient Demographics:    Jason Bruce, is a 44 y.o. male, DOB - [birth year], BJS:[id number]  Outpatient Primary MD for the patient is Patient, No Pcp Per   Admit date - 09/29/2019   LOS - 2  Chief Complaint  Patient presents with  . Shortness of Breath       Brief Narrative: Patient is a 44 y.o. male with PMHx of DM-2, HTN, morbid obesity-who presented with shortness of breath-he was found to have hypoxia secondary to COVID-19 pneumonia.  Per H&P-patient tested positive for COVID-19 on 8/8.  COVID-19 vaccinated status: Unvaccinated  Significant Events: 8/18>> presented to med [facility] with shortness of breath-hypoxic-secondary to COVID-19.  Significant studies: 8/18>>Chest x-ray: Mild patchy appearing left basilar atelectasis/infiltrate.  COVID-19 medications: Steroids: 8/18>> Remdesivir: 8/18>>  Antibiotics: None  Microbiology data: 8/18 >> urine culture: No growth  Procedures: None  Consults: None  DVT prophylaxis: SCDs Start: 09/30/19 1630 Prophylactic Lovenox    Subjective:   Titrated down to room air-feels better-still with cough. Claims that compared to how he first came into the hospital he feels significantly better.   Assessment  & Plan :   Acute Hypoxic Resp Failure due to Covid 19 Viral pneumonia: Improved-titrated to room air-continue steroids but taper-continue remdesivir. If CBGs stabilize over the next day-suspect can be discharged on 8/22 to complete Remdesivir in the infusion center.  Fever: afebrile O2 requirements:  SpO2: 90 % O2 Flow Rate (L/min): 3 L/min   COVID-19 Labs: Recent Labs    09/30/19 0424 09/30/19 0425 09/30/19 2002 10/01/19 1018  10/02/19 0914  DDIMER 0.60*  --   --  0.46 0.40  FERRITIN  --   --  830* 656* 700*  LDH 248*  --   --   --   --   CRP  --  4.7*  --  2.0* 1.5*       Component Value Date/Time   BNP 23.0 09/30/2019 2002    Recent Labs  Lab 09/30/19 0424  PROCALCITON <0.10    No results found for: SARSCOV2NAA    Prone/Incentive Spirometry: encouraged  incentive spirometry use 3-4/hour.  AKI: Likely hemodynamically mediated-follow labs-pending this morning.  Avoid nephrotoxic agents.  Transaminitis: Likely secondary to COVID-19-follow-pending labs this morning.  HTN: BP controlled-not on any antihypertensives-resume when able.  DM-2 (A1c 9.2 on 8/19) with uncontrolled hyperglycemia secondary to steroids: CBGs uncontrolled-increase Lantus to 30 units daily, increase Premeal NovoLog to 8 units-continue resistant SSI. Steroids have been tapered down significantly-suspect CBGs will improve over the next few days. Patient not keen on starting Lantus/insulin on discharge-we will try to monitor one more day-if CBGs stabilize-we will plan on discharge home with oral hypoglycemic agents.  Recent Labs    10/01/19 1614 10/01/19 2056 10/02/19 0819  GLUCAP 464* 391* 383*    Morbid Obesity: Estimated body mass index is 48.83 kg/m as calculated from the following:   Height as of this encounter: 5\' 9"  (1.753 m).   Weight as of this encounter: 150 kg.    ABG: No results found for: PHART, PCO2ART, PO2ART, HCO3, TCO2, ACIDBASEDEF, O2SAT  Vent Settings: N/A  Condition - Stable  Family Communication  :  Spouse updated over the phone (spouse present [facility]-she was face timing husband when I was in the room) on 8/20-we will update tomorrow. He remains relatively stable.  Code Status :  Full Code  Diet :  Diet Order            Diet 2 gram sodium Room service appropriate? Yes; Fluid consistency: Thin  Diet effective now                  Disposition Plan  :   Status is:  Inpatient  Remains inpatient appropriate because:Inpatient level of care appropriate due to severity of illness   Dispo: The patient is from: Home              Anticipated d/c is to: Home              Anticipated d/c date is: 2 days              Patient currently is not medically stable to d/c.   Barriers to discharge: Hypoxia requiring O2 supplementation/complete 5 days of IV Remdesivir  Antimicorbials  :    Anti-infectives (From admission, onward)   Start     Dose/Rate Route Frequency Ordered Stop   10/01/19 1000  remdesivir 100 mg in sodium chloride 0.9 % 100 mL IVPB  Status:  Discontinued       "Followed by" Linked Group Details   100 mg 200 mL/hr over 30 Minutes Intravenous Daily 09/30/19 0328 09/30/19 1319   10/01/19 1000  remdesivir 100 mg in sodium chloride 0.9 % 100 mL IVPB        100 mg 200 mL/hr over 30 Minutes Intravenous Daily 09/30/19 1319 10/05/19 0959   10/01/19 1000  remdesivir 100 mg in sodium chloride 0.9 % 100 mL IVPB  Status:  Discontinued       "Followed by" Linked Group Details   100 mg 200 mL/hr over 30 Minutes Intravenous Daily 09/30/19 1631 09/30/19 1646   09/30/19 1645  remdesivir 200 mg in sodium chloride 0.9% 250 mL IVPB  Status:  Discontinued       "Followed by" Linked Group Details   200 mg 580 mL/hr over 30 Minutes Intravenous Once 09/30/19 1631 09/30/19 1646   09/30/19 0430  remdesivir 200 mg in sodium chloride 0.9% 250 mL IVPB  Status:  Discontinued       "Followed by" Linked Group Details  200 mg 580 mL/hr over 30 Minutes Intravenous Once 09/30/19 0328 09/30/19 1319      Inpatient Medications  Scheduled Meds: . albuterol  2 puff Inhalation TID  . vitamin C  500 mg Oral Daily  . enoxaparin (LOVENOX) injection  75 mg Subcutaneous Q24H  . insulin aspart  0-20 Units Subcutaneous TID WC  . insulin aspart  0-5 Units Subcutaneous QHS  . insulin aspart  0-5 Units Subcutaneous QHS  . insulin aspart  8 Units Subcutaneous TID WC  . insulin  glargine  30 Units Subcutaneous Daily  . methylPREDNISolone (SOLU-MEDROL) injection  60 mg Intravenous Q12H  . zinc sulfate  220 mg Oral Daily   Continuous Infusions: . remdesivir 100 mg in NS 100 mL 100 mg (10/02/19 0853)   PRN Meds:.acetaminophen, chlorpheniramine-HYDROcodone, guaiFENesin-dextromethorphan, hydrALAZINE, loperamide, ondansetron **OR** ondansetron (ZOFRAN) IV   Time Spent in minutes  25  See all Orders from today for further details   Oren Binet M.D on 10/02/2019 at 11:32 AM  To page go to [email or web address] - use universal password  [facility] -  Office  [phone]    Objective:   Vitals:   10/01/19 1643 10/01/19 2104 10/02/19 0308 10/02/19 0544  BP: (!) 151/106 (!) 150/86  (!) 151/97  Pulse: 100 98  95  Resp: 20 19  18   Temp:  98.5 F (36.9 C)  98.7 F (37.1 C)  TempSrc:  Oral  Oral  SpO2: 94% 92%  90%  Weight:   (!) 150 kg   Height:        Wt Readings from Last 3 Encounters:  10/02/19 (!) 150 kg  01/12/19 (!) 157.7 kg  11/25/18 (!) 153.8 kg     Intake/Output Summary (Last 24 hours) at 10/02/2019 1132 Last data filed at 10/02/2019 0554 Gross per 24 hour  Intake 580 ml  Output 500 ml  Net 80 ml     Physical Exam Gen Exam:Alert awake-not in any distress HEENT:atraumatic, normocephalic Chest: B/L clear to auscultation anteriorly CVS:S1S2 regular Abdomen:soft non tender, non distended Extremities:no edema Neurology: Non focal Skin: no rash   Data Review:    CBC Recent Labs  Lab 09/29/19 1715 09/30/19 2002 10/01/19 1018 10/02/19 0914  WBC 6.7 6.1 6.6 10.2  HGB 13.9 12.6* 11.2* 12.6*  HCT 44.2 41.2 36.8* 39.2  PLT 386 419* 391 464*  MCV 86.8 88.4 87.6 86.5  MCH 27.3 27.0 26.7 27.8  MCHC 31.4 30.6 30.4 32.1  RDW 14.5 14.3 14.0 13.9  LYMPHSABS  --   --  0.7 1.2  MONOABS  --   --  0.3 0.4  EOSABS  --   --  0.0 0.0  BASOSABS  --   --  0.0 0.0    Chemistries  Recent Labs  Lab 09/29/19 1715 09/30/19 2002  10/01/19 1018 10/02/19 0914  NA 136  --  133* 133*  K 3.5  --  4.0 4.3  CL 93*  --  94* 95*  CO2 26  --  26 25  GLUCOSE 267*  --  529* 465*  BUN 50*  --  27* 25*  CREATININE 1.60* 1.70* 1.40* 1.26*  CALCIUM 9.0  --  8.6* 9.2  MG  --   --  2.1 2.0  AST 51*  --  31 67*  ALT 62*  --  46* 72*  ALKPHOS 61  --  55 56  BILITOT 0.7  --  0.9 0.7   ------------------------------------------------------------------------------------------------------------------ No results for input(s): CHOL, HDL, LDLCALC, TRIG,  CHOLHDL, LDLDIRECT in the last 72 hours.  Lab Results  Component Value Date   HGBA1C 9.2 (H) 09/30/2019   ------------------------------------------------------------------------------------------------------------------ No results for input(s): TSH, T4TOTAL, T3FREE, THYROIDAB in the last 72 hours.  Invalid input(s): FREET3 ------------------------------------------------------------------------------------------------------------------ Recent Labs    10/01/19 1018 10/02/19 0914  FERRITIN 656* 700*    Coagulation profile No results for input(s): INR, PROTIME in the last 168 hours.  Recent Labs    10/01/19 1018 10/02/19 0914  DDIMER 0.46 0.40    Cardiac Enzymes No results for input(s): CKMB, TROPONINI, MYOGLOBIN in the last 168 hours.  Invalid input(s): CK ------------------------------------------------------------------------------------------------------------------    Component Value Date/Time   BNP 23.0 09/30/2019 2002    Micro Results Recent Results (from the past 240 hour(s))  Urine culture     Status: None   Collection Time: 09/29/19 11:22 PM   Specimen: Urine, Clean Catch  Result Value Ref Range Status   Specimen Description   Final    URINE, CLEAN CATCH Performed at [facility], [address] [place], [place] [address]    Special Requests   Final    NONE Performed at [facility], [address] [place], [place]  [address]    Culture   Final    NO GROWTH Performed at [facility], [place] [address] [place], [place] [address]    Report Status 10/01/2019 FINAL  Final    Radiology Reports DG Chest Portable 1 View  Result Date: 09/29/2019 CLINICAL DATA:  Cough with shortness of breath. COVID positive. EXAM: PORTABLE CHEST 1 VIEW COMPARISON:  September 22, 2019 FINDINGS: Mildly decreased lung volumes are seen which is likely secondary to the degree of patient inspiration. Mild linear atelectasis is seen within the right perihilar region, mid left lung and left lung base. Mild slightly patchy appearing left basilar atelectasis and/or infiltrate is also seen. There is no evidence of a pleural effusion or pneumothorax. The heart size and mediastinal contours are within normal limits. The visualized skeletal structures are unremarkable. IMPRESSION: Mild slightly patchy appearing left basilar atelectasis and/or infiltrate. Electronically Signed   By: Virgina Norfolk M.D.   On: 09/29/2019 17:55

## 2019-10-02 NOTE — Plan of Care (Signed)

## 2019-10-03 DIAGNOSIS — I10 Essential (primary) hypertension: Secondary | ICD-10-CM

## 2019-10-03 DIAGNOSIS — U071 COVID-19: Principal | ICD-10-CM

## 2019-10-03 DIAGNOSIS — E119 Type 2 diabetes mellitus without complications: Secondary | ICD-10-CM

## 2019-10-03 DIAGNOSIS — J9601 Acute respiratory failure with hypoxia: Secondary | ICD-10-CM

## 2019-10-03 DIAGNOSIS — N179 Acute kidney failure, unspecified: Secondary | ICD-10-CM

## 2019-10-03 LAB — COMPREHENSIVE METABOLIC PANEL
ALT: 86 U/L — ABNORMAL HIGH (ref 0–44)
AST: 53 U/L — ABNORMAL HIGH (ref 15–41)
Albumin: 3.3 g/dL — ABNORMAL LOW (ref 3.5–5.0)
Alkaline Phosphatase: 50 U/L (ref 38–126)
Anion gap: 7 (ref 5–15)
BUN: 27 mg/dL — ABNORMAL HIGH (ref 6–20)
CO2: 30 mmol/L (ref 22–32)
Calcium: 9 mg/dL (ref 8.9–10.3)
Chloride: 99 mmol/L (ref 98–111)
Creatinine, Ser: 1.34 mg/dL — ABNORMAL HIGH (ref 0.61–1.24)
GFR calc Af Amer: >60 mL/min (ref >60)
GFR calc non Af Amer: >60 mL/min (ref >60)
Glucose, Bld: 178 mg/dL — ABNORMAL HIGH (ref 70–99)
Potassium: 3.6 mmol/L (ref 3.5–5.1)
Sodium: 136 mmol/L (ref 135–145)
Total Bilirubin: 0.5 mg/dL (ref 0.3–1.2)
Total Protein: 7 g/dL (ref 6.5–8.1)

## 2019-10-03 LAB — CBC WITH DIFFERENTIAL/PLATELET
Abs Immature Granulocytes: 0.34 10*3/uL — ABNORMAL HIGH (ref 0.00–0.07)
Basophils Absolute: 0 10*3/uL (ref 0.0–0.1)
Basophils Relative: 0 %
Eosinophils Absolute: 0 10*3/uL (ref 0.0–0.5)
Eosinophils Relative: 0 %
HCT: 37.9 % — ABNORMAL LOW (ref 39.0–52.0)
Hemoglobin: 11.8 g/dL — ABNORMAL LOW (ref 13.0–17.0)
Immature Granulocytes: 3 %
Lymphocytes Relative: 16 %
Lymphs Abs: 1.7 10*3/uL (ref 0.7–4.0)
MCH: 26.9 pg (ref 26.0–34.0)
MCHC: 31.1 g/dL (ref 30.0–36.0)
MCV: 86.5 fL (ref 80.0–100.0)
Monocytes Absolute: 1 10*3/uL (ref 0.1–1.0)
Monocytes Relative: 10 %
Neutro Abs: 7.1 10*3/uL (ref 1.7–7.7)
Neutrophils Relative %: 71 %
Platelets: 440 10*3/uL — ABNORMAL HIGH (ref 150–400)
RBC: 4.38 MIL/uL (ref 4.22–5.81)
RDW: 14 % (ref 11.5–15.5)
WBC: 10.1 10*3/uL (ref 4.0–10.5)
nRBC: 0 % (ref 0.0–0.2)

## 2019-10-03 LAB — FERRITIN: Ferritin: 631 ng/mL — ABNORMAL HIGH (ref 24–336)

## 2019-10-03 LAB — C-REACTIVE PROTEIN: CRP: 0.8 mg/dL (ref <1.0)

## 2019-10-03 LAB — GLUCOSE, CAPILLARY
Glucose-Capillary: 271 mg/dL — ABNORMAL HIGH (ref 70–99)
Glucose-Capillary: 198 mg/dL — ABNORMAL HIGH (ref 70–99)

## 2019-10-03 LAB — PHOSPHORUS: Phosphorus: 3 mg/dL (ref 2.5–4.6)

## 2019-10-03 LAB — MAGNESIUM: Magnesium: 2 mg/dL (ref 1.7–2.4)

## 2019-10-03 LAB — D-DIMER, QUANTITATIVE: D-Dimer, Quant: 0.37 ug/mL-FEU (ref 0.00–0.50)

## 2019-10-03 MED ORDER — BENZONATATE 100 MG PO CAPS: 100.0000 mg | ORAL_CAPSULE | Freq: Four times a day (QID) | ORAL | 0 refills | Status: DC | PRN

## 2019-10-03 MED ORDER — DEXAMETHASONE 6 MG PO TABS: 6.0000 mg | ORAL_TABLET | Freq: Every day | ORAL | 0 refills | Status: DC

## 2019-10-03 MED ORDER — ALBUTEROL SULFATE HFA 108 (90 BASE) MCG/ACT IN AERS: 2.0000 | INHALATION_SPRAY | RESPIRATORY_TRACT | 0 refills | Status: DC | PRN

## 2019-10-03 MED ORDER — AMLODIPINE BESYLATE 10 MG PO TABS: 10.0000 mg | ORAL_TABLET | Freq: Every day | ORAL | 0 refills | Status: DC

## 2019-10-03 MED ORDER — LINAGLIPTIN 5 MG PO TABS
5.0000 mg | ORAL_TABLET | Freq: Every day | ORAL | 0 refills | Status: DC
Start: 2019-10-04 — End: 2019-10-27

## 2019-10-03 NOTE — Progress Notes (Signed)
Jason Bruce to be D/C'd Home per MD order.  Discussed with the patient and all questions fully answered.  VSS, Skin clean, dry and intact without evidence of skin break down, no evidence of skin tears noted. IV catheter discontinued intact. Site without signs and symptoms of complications. Dressing and pressure applied.  An After Visit Summary was printed and given to the patient. Patient received [place] letter and brochure.  D/c education completed with patient/family including follow up instructions, medication list, d/c activities limitations if indicated, with other d/c instructions as indicated by MD - patient able to verbalize understanding, all questions fully answered. Patient made aware of precautions to take at home regarding COVID-19.  Patient instructed to return to ED, call 911, or call MD for any changes in condition.   Patient escorted via [place], and D/C home via private auto.  Trenace Coughlin T Yilia Sacca 10/03/2019 2:30 PM

## 2019-10-03 NOTE — Progress Notes (Signed)
Patient scheduled for outpatient Remdesivir infusions at 1 pm on Monday 8/23 at [facility]. Please inform the patient to park at [place], as staff will be escorting the patient through the [place] entrance of the hospital.    There is a wave flag banner located near the entrance on [address]. Turn into this entrance and immediately turn left and park in 1 of the 5 designated Covid Infusion Parking spots. There is a phone number on the sign, please call and let the staff know what spot you are in and we will come out and get you. For questions call [phone].  Thanks.  * If patient is getting dropped off, you may have your ride pull up to the Main Entrance of [facility]. Please stay in the car and call [phone], staff will meet you at your car and escort you into the hospital and back to the clinic.

## 2019-10-03 NOTE — Discharge Instructions (Addendum)
Patient scheduled for outpatient Remdesivir infusions at 1 pm on Monday 8/23 at [facility]. Please inform the patient to park at [place], as staff will be escorting the patient through the [place] entrance of the hospital.    There is a wave flag banner located near the entrance on [address]. Turn into this entrance and immediately turn left and park in 1 of the 5 designated Covid Infusion Parking spots. There is a phone number on the sign, please call and let the staff know what spot you are in and we will come out and get you. For questions call [phone].  Thanks.  * If patient is getting dropped off, you may have your ride pull up to the Main Entrance of [facility]. Please stay in the car and call [phone], staff will meet you at your car and escort you into the hospital and back to the clinic.

## 2019-10-03 NOTE — Discharge Summary (Addendum)
PATIENT DETAILS Name: Jason Bruce Age: 44 y.o. Sex: male Date of Birth: [birth year] MRN: [id number]. Admitting Physician: Mckinley Jewel, MD AYT:[id number], No Pcp Per  Admit Date: 09/29/2019 Discharge date: 10/03/2019  Recommendations for Outpatient Follow-up:  1. Follow up with PCP in 1-2 weeks 2. Please obtain CMP/CBC in one week 3. Repeat Chest Xray in 4-6 week  Admitted From:  Home  Disposition: [place]: No  Equipment/Devices: None  Discharge Condition: Stable  CODE STATUS: FULL CODE  Diet recommendation:  Diet Order            Diet - low sodium heart healthy           Diet Carb Modified           Diet heart healthy/carb modified Room service appropriate? Yes; Fluid consistency: Thin  Diet effective now                  Brief Narrative: Patient is a 44 y.o. male with PMHx of DM-2, HTN, morbid obesity-who presented with shortness of breath-he was found to have hypoxia secondary to COVID-19 pneumonia.  Per H&P-patient tested positive for COVID-19 on 8/8.  COVID-19 vaccinated status: Unvaccinated  Significant Events: 8/18>> presented to med [facility] with shortness of breath-hypoxic-secondary to COVID-19.  Significant studies: 8/18>>Chest x-ray: Mild patchy appearing left basilar atelectasis/infiltrate.  COVID-19 medications: Steroids: 8/18>> Remdesivir: 8/18>>  Antibiotics: None  Microbiology data: 8/18 >> urine culture: No growth  Procedures: None  Consults: None  Brief Hospital Course: Acute Hypoxic Resp Failure due to Covid 19 Viral pneumonia:  Had a very minimal oxygen requirement-has been titrated to room air since yesterday-feels much better-he is anxious to go home today-he will finish his last dose of Remdesivir at the infusion center on 8/23.  Will remain on steroids for a few more days.  COVID-19 Labs:  Recent Labs    10/01/19 1018 10/02/19 0914 10/03/19 0416  DDIMER 0.46 0.40 0.37    FERRITIN 656* 700* 631*  CRP 2.0* 1.5* 0.8    No results found for: SARSCOV2NAA   AKI: Likely hemodynamically mediated-mild-slowly improving-should be okay for outpatient follow-up with PCP.  Transaminitis: Likely secondary to COVID-19-follow-pending labs this morning.  HTN: BP controlled-but creeping up-continue to hold lisinopril/HCTZ given mild AKI-switch to amlodipine on discharge.  Follow with PCP.  DM-2 (A1c 9.2 on 8/19) with uncontrolled hyperglycemia secondary to steroids: CBGs uncontrolled-treated with Lantus and SSI.  Suspect that this insulin gets tapered down dramatically on discharge-he will not require insulin.  He is very hesitant to try insulin in the outpatient setting-claims he does not like needles.  On discharge-we will continue Tradjenta-and resume Metformin.  He will follow up with [place] primary care practitioner for further optimization.  I  Morbid obesity: Estimated body mass index is 48.83 kg/m as calculated from the following:   Height as of this encounter: 5\' 9"  (1.753 m).   Weight as of this encounter: 150 kg.    Discharge Diagnoses:  Principal Problem:   Acute hypoxemic respiratory failure due to COVID-19 [facility]) Active Problems:   Pneumonia due to COVID-19 virus   Hypertension   DM (diabetes mellitus) ([place])   Elevated liver enzymes   Bacteriuria   AKI (acute kidney injury) [facility])   Discharge Instructions:    Person Under Monitoring Name: Jason Bruce  Location: [address] [place] [address]   Infection Prevention Recommendations for Individuals Confirmed to have, or Being Evaluated for, 2019  Novel Coronavirus (COVID-19) Infection Who Receive Care at Home  Individuals who are confirmed to have, or are being evaluated for, COVID-19 should follow the prevention steps below until a healthcare provider or local or state health department says they can return to normal activities.  Stay home except to get medical care You should  restrict activities outside your home, except for getting medical care. Do not go to work, school, or public areas, and do not use public transportation or taxis.  Call ahead before visiting your doctor Before your medical appointment, call the healthcare provider and tell them that you have, or are being evaluated for, COVID-19 infection. This will help the healthcare provider's office take steps to keep other people from getting infected. Ask your healthcare provider to call the local or state health department.  Monitor your symptoms Seek prompt medical attention if your illness is worsening (e.g., difficulty breathing). Before going to your medical appointment, call the healthcare provider and tell them that you have, or are being evaluated for, COVID-19 infection. Ask your healthcare provider to call the local or state health department.  Wear a facemask You should wear a facemask that covers your nose and mouth when you are in the same room with other people and when you visit a healthcare provider. People who live with or visit you should also wear a facemask while they are in the same room with you.  Separate yourself from other people in your home As much as possible, you should stay in a different room from other people in your home. Also, you should use a separate bathroom, if available.  Avoid sharing household items You should not share dishes, drinking glasses, cups, eating utensils, towels, bedding, or other items with other people in your home. After using these items, you should wash them thoroughly with soap and water.  Cover your coughs and sneezes Cover your mouth and nose with a tissue when you cough or sneeze, or you can cough or sneeze into your sleeve. Throw used tissues in a lined trash can, and immediately wash your hands with soap and water for at least 20 seconds or use an alcohol-based hand rub.  Wash your [facility] your hands often and thoroughly with  soap and water for at least 20 seconds. You can use an alcohol-based hand sanitizer if soap and water are not available and if your hands are not visibly dirty. Avoid touching your eyes, nose, and mouth with unwashed hands.   Prevention Steps for Caregivers and Household Members of Individuals Confirmed to have, or Being Evaluated for, COVID-19 Infection Being Cared for in the Home  If you live with, or provide care at home for, a person confirmed to have, or being evaluated for, COVID-19 infection please follow these guidelines to prevent infection:  Follow healthcare provider's instructions Make sure that you understand and can help the patient follow any healthcare provider instructions for all care.  Provide for the patient's basic needs You should help the patient with basic needs in the home and provide support for getting groceries, prescriptions, and other personal needs.  Monitor the patient's symptoms If they are getting sicker, call his or her medical provider and tell them that the patient has, or is being evaluated for, COVID-19 infection. This will help the healthcare provider's office take steps to keep other people from getting infected. Ask the healthcare provider to call the local or state health department.  Limit the number of people who have contact with  the patient  If possible, have only one caregiver for the patient.  Other household members should stay in another home or place of residence. If this is not possible, they should stay  in another room, or be separated from the patient as much as possible. Use a separate bathroom, if available.  Restrict visitors who do not have an essential need to be in the home.  Keep older adults, very young children, and other sick people away from the patient Keep older adults, very young children, and those who have compromised immune systems or chronic health conditions away from the patient. This includes people with  chronic heart, lung, or kidney conditions, diabetes, and cancer.  Ensure good ventilation Make sure that shared spaces in the home have good air flow, such as from an air conditioner or an opened window, weather permitting.  Wash your hands often  Wash your hands often and thoroughly with soap and water for at least 20 seconds. You can use an alcohol based hand sanitizer if soap and water are not available and if your hands are not visibly dirty.  Avoid touching your eyes, nose, and mouth with unwashed hands.  Use disposable paper towels to dry your hands. If not available, use dedicated cloth towels and replace them when they become wet.  Wear a facemask and gloves  Wear a disposable facemask at all times in the room and gloves when you touch or have contact with the patient's blood, body fluids, and/or secretions or excretions, such as sweat, saliva, sputum, nasal mucus, vomit, urine, or feces.  Ensure the mask fits over your nose and mouth tightly, and do not touch it during use.  Throw out disposable facemasks and gloves after using them. Do not reuse.  Wash your hands immediately after removing your facemask and gloves.  If your personal clothing becomes contaminated, carefully remove clothing and launder. Wash your hands after handling contaminated clothing.  Place all used disposable facemasks, gloves, and other waste in a lined container before disposing them with other household waste.  Remove gloves and wash your hands immediately after handling these items.  Do not share dishes, glasses, or other household items with the patient  Avoid sharing household items. You should not share dishes, drinking glasses, cups, eating utensils, towels, bedding, or other items with a patient who is confirmed to have, or being evaluated for, COVID-19 infection.  After the person uses these items, you should wash them thoroughly with soap and water.  Wash laundry thoroughly  Immediately  remove and wash clothes or bedding that have blood, body fluids, and/or secretions or excretions, such as sweat, saliva, sputum, nasal mucus, vomit, urine, or feces, on them.  Wear gloves when handling laundry from the patient.  Read and follow directions on labels of laundry or clothing items and detergent. In general, wash and dry with the warmest temperatures recommended on the label.  Clean all areas the individual has used often  Clean all touchable surfaces, such as counters, tabletops, doorknobs, bathroom fixtures, toilets, phones, keyboards, tablets, and bedside tables, every day. Also, clean any surfaces that may have blood, body fluids, and/or secretions or excretions on them.  Wear gloves when cleaning surfaces the patient has come in contact with.  Use a diluted bleach solution (e.g., dilute bleach with 1 part bleach and 10 parts water) or a household disinfectant with a label that says EPA-registered for coronaviruses. To make a bleach solution at home, add 1 tablespoon of bleach to 1  quart (4 cups) of water. For a larger supply, add  cup of bleach to 1 gallon (16 cups) of water.  Read labels of cleaning products and follow recommendations provided on product labels. Labels contain instructions for safe and effective use of the cleaning product including precautions you should take when applying the product, such as wearing gloves or eye protection and making sure you have good ventilation during use of the product.  Remove gloves and wash hands immediately after cleaning.  Monitor yourself for signs and symptoms of illness Caregivers and household members are considered close contacts, should monitor their health, and will be asked to limit movement outside of the home to the extent possible. Follow the monitoring steps for close contacts listed on the symptom monitoring form.   ? If you have additional questions, contact your local health department or call the epidemiologist  on call at [phone] (available 24/7). ? This guidance is subject to change. For the most up-to-date guidance from CDC, please refer to their website: YouBlogs.pl    Activity:  As tolerated   Discharge Instructions    Call MD for:  difficulty breathing, headache or visual disturbances   Complete by: As directed    Diet - low sodium heart healthy   Complete by: As directed    Diet Carb Modified   Complete by: As directed    Discharge instructions   Complete by: As directed    Follow with Primary MD in 1-2 weeks  Please follow-up with PCP for further optimization of insulin regimen.  3 weeks of isolation from 09/19/2019  You had mild kidney failure during this hospital stay-hence your blood pressure medications-HCTZ/lisinopril on hold-we have switched you to another blood pressure medication called amlodipine.  Your primary care practitioner needs to do blood work-and if your kidney function has improved-should be okay to resume your regular antihypertensives.  Please ask your primary care MD-to refer you for a sleep study.  As you may have obstructive sleep apnea.  Please get a complete blood count and chemistry panel checked by your Primary MD at your next visit, and again as instructed by your Primary MD.  Get Medicines reviewed and adjusted: Please take all your medications with you for your next visit with your Primary MD  Laboratory/radiological data: Please request your Primary MD to go over all hospital tests and procedure/radiological results at the follow up, please ask your Primary MD to get all Hospital records sent to his/her office.  In some cases, they will be blood work, cultures and biopsy results pending at the time of your discharge. Please request that your primary care M.D. follows up on these results.  Also Note the following: If you experience worsening of your admission symptoms, develop  shortness of breath, life threatening emergency, suicidal or homicidal thoughts you must seek medical attention immediately by calling 911 or calling your MD immediately  if symptoms less severe.  You must read complete instructions/literature along with all the possible adverse reactions/side effects for all the Medicines you take and that have been prescribed to you. Take any new Medicines after you have completely understood and accpet all the possible adverse reactions/side effects.   Do not drive when taking Pain medications or sleeping medications (Benzodaizepines)  Do not take more than prescribed Pain, Sleep and Anxiety Medications. It is not advisable to combine anxiety,sleep and pain medications without talking with your primary care practitioner  Special Instructions: If you have smoked or chewed Tobacco  in the  last 2 yrs please stop smoking, stop any regular Alcohol  and or any Recreational drug use.  Wear Seat belts while driving.  Please note: You were cared for by a hospitalist during your hospital stay. Once you are discharged, your primary care physician will handle any further medical issues. Please note that NO REFILLS for any discharge medications will be authorized once you are discharged, as it is imperative that you return to your primary care physician (or establish a relationship with a primary care physician if you do not have one) for your post hospital discharge needs so that they can reassess your need for medications and monitor your lab values.   Increase activity slowly   Complete by: As directed      Allergies as of 10/03/2019      Reactions   Shellfish Allergy Anaphylaxis, Swelling      Medication List    STOP taking these medications   hydrochlorothiazide 25 MG tablet Commonly known as: HYDRODIURIL   lisinopril 10 MG tablet Commonly known as: ZESTRIL     TAKE these medications   albuterol 108 (90 Base) MCG/ACT inhaler Commonly known as: VENTOLIN  HFA Inhale 2 puffs into the lungs every 4 (four) hours as needed for shortness of breath.   amLODipine 10 MG tablet Commonly known as: NORVASC Take 1 tablet (10 mg total) by mouth daily.   benzonatate 100 MG capsule Commonly known as: Tessalon Perles Take 1 capsule (100 mg total) by mouth every 6 (six) hours as needed for cough.   dexamethasone 6 MG tablet Commonly known as: DECADRON Take 1 tablet (6 mg total) by mouth daily.   linagliptin 5 MG Tabs tablet Commonly known as: TRADJENTA Take 1 tablet (5 mg total) by mouth daily. Start taking on: October 04, 2019   metFORMIN 500 MG tablet Commonly known as: GLUCOPHAGE Take 500 mg by mouth 2 (two) times daily with a meal.   multivitamin with minerals Tabs tablet Take 1 tablet by mouth daily.   Potassium Citrate 15 MEQ (1620 MG) Tbcr Take 30 mEq by mouth 2 (two) times daily.       Follow-up Information    [facility]. Go on 10/27/2019.   Why: 1000 go to the clinic, wear a face mask. They have a pharmacy, finacaial councelling and primary care MD Contact information: [place] [phone]             Allergies  Allergen Reactions  . Shellfish Allergy Anaphylaxis and Swelling    Other Procedures/Studies: DG Chest Portable 1 View  Result Date: 09/29/2019 CLINICAL DATA:  Cough with shortness of breath. COVID positive. EXAM: PORTABLE CHEST 1 VIEW COMPARISON:  September 22, 2019 FINDINGS: Mildly decreased lung volumes are seen which is likely secondary to the degree of patient inspiration. Mild linear atelectasis is seen within the right perihilar region, mid left lung and left lung base. Mild slightly patchy appearing left basilar atelectasis and/or infiltrate is also seen. There is no evidence of a pleural effusion or pneumothorax. The heart size and mediastinal contours are within normal limits. The visualized skeletal structures are unremarkable.  IMPRESSION: Mild slightly patchy appearing left basilar atelectasis and/or infiltrate. Electronically Signed   By: Virgina Norfolk M.D.   On: 09/29/2019 17:55     TODAY-DAY OF DISCHARGE:  Subjective:   Jason Bruce today has no headache,no chest abdominal pain,no new weakness tingling or numbness, feels much better wants to go home  today.   Objective:   Blood pressure (!) 156/76, pulse 93, temperature 98.4 F (36.9 C), temperature source Oral, resp. rate 18, height 5\' 9"  (1.753 m), weight (!) 150 kg, SpO2 93 %.  Intake/Output Summary (Last 24 hours) at 10/03/2019 1124 Last data filed at 10/03/2019 0958 Gross per 24 hour  Intake 480 ml  Output 240 ml  Net 240 ml   Filed Weights   09/29/19 1652 09/30/19 1616 10/02/19 0308  Weight: 136.1 kg (!) 147.8 kg (!) 150 kg    Exam: Awake Alert, Oriented *3, No new F.N deficits, Normal affect [place] 6.AT,PERRAL Supple Neck,No JVD, No cervical lymphadenopathy appriciated.  Symmetrical Chest wall movement, Good air movement bilaterally, CTAB RRR,No Gallops,Rubs or new Murmurs, No Parasternal Heave +ve B.Sounds, Abd Soft, Non tender, No organomegaly appriciated, No rebound -guarding or rigidity. No Cyanosis, Clubbing or edema, No new Rash or bruise   PERTINENT RADIOLOGIC STUDIES: DG Chest Portable 1 View  Result Date: 09/29/2019 CLINICAL DATA:  Cough with shortness of breath. COVID positive. EXAM: PORTABLE CHEST 1 VIEW COMPARISON:  September 22, 2019 FINDINGS: Mildly decreased lung volumes are seen which is likely secondary to the degree of patient inspiration. Mild linear atelectasis is seen within the right perihilar region, mid left lung and left lung base. Mild slightly patchy appearing left basilar atelectasis and/or infiltrate is also seen. There is no evidence of a pleural effusion or pneumothorax. The heart size and mediastinal contours are within normal limits. The visualized skeletal structures are unremarkable. IMPRESSION: Mild slightly  patchy appearing left basilar atelectasis and/or infiltrate. Electronically Signed   By: Virgina Norfolk M.D.   On: 09/29/2019 17:55     PERTINENT LAB RESULTS: CBC: Recent Labs    10/02/19 0914 10/03/19 0416  WBC 10.2 10.1  HGB 12.6* 11.8*  HCT 39.2 37.9*  PLT 464* 440*   CMET CMP     Component Value Date/Time   NA 136 10/03/2019 0416   NA 142 08/11/2011 2105   K 3.6 10/03/2019 0416   K 4.2 08/11/2011 2105   CL 99 10/03/2019 0416   CL 104 08/11/2011 2105   CO2 30 10/03/2019 0416   CO2 30 08/11/2011 2105   GLUCOSE 178 (H) 10/03/2019 0416   GLUCOSE 102 (H) 08/11/2011 2105   BUN 27 (H) 10/03/2019 0416   BUN 14 08/11/2011 2105   CREATININE 1.34 (H) 10/03/2019 0416   CREATININE 1.06 08/11/2011 2105   CALCIUM 9.0 10/03/2019 0416   CALCIUM 9.2 08/11/2011 2105   PROT 7.0 10/03/2019 0416   PROT 8.7 (H) 08/11/2011 2105   ALBUMIN 3.3 (L) 10/03/2019 0416   ALBUMIN 4.3 08/11/2011 2105   AST 53 (H) 10/03/2019 0416   AST 30 08/11/2011 2105   ALT 86 (H) 10/03/2019 0416   ALT 31 08/11/2011 2105   ALKPHOS 50 10/03/2019 0416   ALKPHOS 68 08/11/2011 2105   BILITOT 0.5 10/03/2019 0416   BILITOT 0.4 08/11/2011 2105   GFRNONAA >60 10/03/2019 0416   GFRNONAA >60 08/11/2011 2105   GFRAA >60 10/03/2019 0416   GFRAA >60 08/11/2011 2105    GFR Estimated Creatinine Clearance: 100.8 mL/min (A) (by C-G formula based on SCr of 1.34 mg/dL (H)). No results for input(s): LIPASE, AMYLASE in the last 72 hours. No results for input(s): CKTOTAL, CKMB, CKMBINDEX, TROPONINI in the last 72 hours. Invalid input(s): POCBNP Recent Labs    10/02/19 0914 10/03/19 0416  DDIMER 0.40 0.37   No results for input(s): HGBA1C in the last 72 hours. No  results for input(s): CHOL, HDL, LDLCALC, TRIG, CHOLHDL, LDLDIRECT in the last 72 hours. No results for input(s): TSH, T4TOTAL, T3FREE, THYROIDAB in the last 72 hours.  Invalid input(s): FREET3 Recent Labs    10/02/19 0914 10/03/19 0416  FERRITIN  700* 631*   Coags: No results for input(s): INR in the last 72 hours.  Invalid input(s): PT Microbiology: Recent Results (from the past 240 hour(s))  Urine culture     Status: None   Collection Time: 09/29/19 11:22 PM   Specimen: Urine, Clean Catch  Result Value Ref Range Status   Specimen Description   Final    URINE, CLEAN CATCH Performed at [facility], [address] [place], [place] [address]    Special Requests   Final    NONE Performed at [facility], [address] [place], [place] [address]    Culture   Final    NO GROWTH Performed at [facility], [place] [address] [place], [place] [address]    Report Status 10/01/2019 FINAL  Final    FURTHER DISCHARGE INSTRUCTIONS:  Get Medicines reviewed and adjusted: Please take all your medications with you for your next visit with your Primary MD  Laboratory/radiological data: Please request your Primary MD to go over all hospital tests and procedure/radiological results at the follow up, please ask your Primary MD to get all Hospital records sent to his/her office.  In some cases, they will be blood work, cultures and biopsy results pending at the time of your discharge. Please request that your primary care M.D. goes through all the records of your hospital data and follows up on these results.  Also Note the following: If you experience worsening of your admission symptoms, develop shortness of breath, life threatening emergency, suicidal or homicidal thoughts you must seek medical attention immediately by calling 911 or calling your MD immediately  if symptoms less severe.  You must read complete instructions/literature along with all the possible adverse reactions/side effects for all the Medicines you take and that have been prescribed to you. Take any new Medicines after you have completely understood and accpet all the possible adverse reactions/side effects.   Do not drive when taking  Pain medications or sleeping medications (Benzodaizepines)  Do not take more than prescribed Pain, Sleep and Anxiety Medications. It is not advisable to combine anxiety,sleep and pain medications without talking with your primary care practitioner  Special Instructions: If you have smoked or chewed Tobacco  in the last 2 yrs please stop smoking, stop any regular Alcohol  and or any Recreational drug use.  Wear Seat belts while driving.  Please note: You were cared for by a hospitalist during your hospital stay. Once you are discharged, your primary care physician will handle any further medical issues. Please note that NO REFILLS for any discharge medications will be authorized once you are discharged, as it is imperative that you return to your primary care physician (or establish a relationship with a primary care physician if you do not have one) for your post hospital discharge needs so that they can reassess your need for medications and monitor your lab values.  Total Time spent coordinating discharge including counseling, education and face to face time equals35 minutes.  SignedOren Binet 10/03/2019 11:24 AM

## 2019-10-03 NOTE — Progress Notes (Signed)
Dr. Tonie Griffith notified regarding CBG of 457. One time order for 12 units Novolog received, see MAR.   2242 Notified MD. CBG now 428. New order for one more dose of Novolog 8 units received. See MAR.

## 2019-10-03 NOTE — Care Management (Signed)
Explained use of MATCH over the phone. Tubed [place] brochure and MATCH to 5W.  Notified nurse to make sure patient gets it before he leaves.

## 2019-10-04 ENCOUNTER — Ambulatory Visit (HOSPITAL_COMMUNITY): Payer: Self-pay | Attending: Pulmonary Disease

## 2019-10-27 ENCOUNTER — Ambulatory Visit: Payer: HRSA Program | Attending: Critical Care Medicine | Admitting: Critical Care Medicine

## 2019-10-27 ENCOUNTER — Encounter: Payer: Self-pay | Admitting: Critical Care Medicine

## 2019-10-27 DIAGNOSIS — E119 Type 2 diabetes mellitus without complications: Secondary | ICD-10-CM

## 2019-10-27 DIAGNOSIS — N179 Acute kidney failure, unspecified: Secondary | ICD-10-CM | POA: Diagnosis not present

## 2019-10-27 DIAGNOSIS — U071 COVID-19: Secondary | ICD-10-CM

## 2019-10-27 DIAGNOSIS — R748 Abnormal levels of other serum enzymes: Secondary | ICD-10-CM | POA: Diagnosis not present

## 2019-10-27 DIAGNOSIS — J9601 Acute respiratory failure with hypoxia: Secondary | ICD-10-CM

## 2019-10-27 DIAGNOSIS — J1282 Pneumonia due to coronavirus disease 2019: Secondary | ICD-10-CM

## 2019-10-27 DIAGNOSIS — I10 Essential (primary) hypertension: Secondary | ICD-10-CM

## 2019-10-27 MED ORDER — AMLODIPINE BESYLATE 10 MG PO TABS
10.0000 mg | ORAL_TABLET | Freq: Every day | ORAL | 1 refills | Status: DC
Start: 2019-10-27 — End: 2020-01-04

## 2019-10-27 MED ORDER — METFORMIN HCL 500 MG PO TABS
500.0000 mg | ORAL_TABLET | Freq: Two times a day (BID) | ORAL | 3 refills | Status: DC
Start: 2019-10-27 — End: 2020-01-04

## 2019-10-27 MED ORDER — ACCU-CHEK GUIDE W/DEVICE KIT: PACK | 0 refills | Status: DC

## 2019-10-27 MED ORDER — LINAGLIPTIN 5 MG PO TABS
5.0000 mg | ORAL_TABLET | Freq: Every day | ORAL | 1 refills | Status: DC
Start: 2019-10-27 — End: 2020-01-04

## 2019-10-27 MED ORDER — ACCU-CHEK GUIDE VI STRP: ORAL_STRIP | 12 refills | Status: DC

## 2019-10-27 MED ORDER — ACCU-CHEK FASTCLIX LANCET KIT: PACK | 0 refills | Status: DC

## 2019-10-27 MED ORDER — LOSARTAN POTASSIUM 100 MG PO TABS: 100.0000 mg | ORAL_TABLET | Freq: Every day | ORAL | 3 refills | Status: DC

## 2019-10-27 MED ORDER — ACCU-CHEK FASTCLIX LANCETS MISC: 1 refills | Status: DC

## 2019-10-27 NOTE — Assessment & Plan Note (Signed)
Acute hypoxic respiratory failure secondary to Covid pneumonia slowly resolving no longer on oxygen  Follow-up chest x-ray at a future date

## 2019-10-27 NOTE — Progress Notes (Signed)
Subjective:    Patient ID: Jason Bruce, male    DOB: 08/31/1975, 44 y.o.   MRN: [id number] Virtual Visit via Telephone Note  I connected with Jason Bruce on 10/27/19 at 10:00 AM EDT by telephone and verified that I am speaking with the correct person using two identifiers.   Consent:  I discussed the limitations, risks, security and privacy concerns of performing an evaluation and management service by telephone and the availability of in person appointments. I also discussed with the patient that there may be a patient responsible charge related to this service. The patient expressed understanding and agreed to proceed.  Location of patient: Patient is at home  Location of provider: I am in my office  Persons participating in the televisit with the patient.    No one else on the call   History of Present Illness:  10/27/2019 This is a pleasant African-American male who was admitted for Covid viral infection with associated pneumonia in August of this year.  Below is a copy of the discharge summary.  This is a telephone visit as the patient cannot achieve a video access on his phone    Admit Date: 09/29/2019 Discharge date: 10/03/2019  Recommendations for Outpatient Follow-up:  1. Follow up with PCP in 1-2 weeks 2. Please obtain CMP/CBC in one week 3. Repeat Chest Xray in 4-6 week Brief Narrative: Patient is a43 y.o.malewith PMHx of DM-2, HTN, morbid obesity-who presented with shortness of breath-he was found to have hypoxia secondary to COVID-19 pneumonia. Per H&P-patient tested positive for COVID-19 on 8/8.  COVID-19 vaccinated status: Unvaccinated  Significant Events: 8/18>> presented to med [facility] with shortness of breath-hypoxic-secondary to COVID-19.  Significant studies: 8/18>>Chest x-ray: Mild patchy appearing left basilar atelectasis/infiltrate.  COVID-19 medications: Steroids:8/18>> Remdesivir:  8/18>>  Antibiotics: None  Microbiology data: 8/18 >> urine culture: No growth  Procedures: None  Consults: None  Brief Hospital Course: Acute Hypoxic Resp Failure due to Covid 19 Viral pneumonia: Had a very minimal oxygen requirement-has been titrated to room air since yesterday-feels much better-he is anxious to go home today-he will finish his last dose of Remdesivir at the infusion center on 8/23.  Will remain on steroids for a few more days.  COVID-19 Labs:  Recent Labs (last 2 labs)  Recent Labs   10/01/19 1018 10/02/19 0914 10/03/19 0416 DDIMER 0.46 0.40 0.37 FERRITIN 656* 700* 631* CRP 2.0* 1.5* 0.8    Recent Labs No results found for: SARSCOV2NAA    LPF:XTKWIO hemodynamically mediated-mild-slowly improving-should be okay for outpatient follow-up with PCP.  Transaminitis:Likely secondary to COVID-19-follow-pending labs this morning.  HTN: BP controlled-but creeping up-continue to hold lisinopril/HCTZ given mild AKI-switch to amlodipine on discharge.  Follow with PCP.  DM-2 (A1c 9.2 on 8/19) with uncontrolled hyperglycemia secondary to steroids:CBGs uncontrolled-treated with Lantus and SSI.  Suspect that this insulin gets tapered down dramatically on discharge-he will not require insulin.  He is very hesitant to try insulin in the outpatient setting-claims he does not like needles.  On discharge-we will continue Tradjenta-and resume Metformin.  He will follow up with [place] primary care practitioner for further optimization.  I  Morbid obesity: Estimated body mass index is 48.83 kg/m as calculated from the following:   Height as of this encounter: '5\' 9"'  (1.753 m).   Weight as of this encounter: 150 kg.    Discharge Diagnoses:  Principal Problem:   Acute hypoxemic respiratory failure due to COVID-19 [facility]) Active Problems:   Pneumonia  due to COVID-19 virus   Hypertension   DM (diabetes mellitus) ([place])   Elevated liver enzymes    Bacteriuria   AKI (acute kidney injury) ([place])   Since discharge the patient's dyspnea is improved.  He has a pulse oximeter saturations were 89% with exertion 9091% at rest.  He has a blood pressure cuff meter at home today's blood pressure is 163/109 pulse is 116.  He denies cough with mucus to has no muscle aches gastrointestinal issues or dizziness.  He does have diabetes on that form and 5 mg twice daily he does not have a glucose meter in his home.  He is not vaccinated he was about to get vaccinated when he and his wife both felt ill with Covid.  She actually is on oxygen he is not she was hospitalized at [facility] he was hospitalized here in [place]  He has finished his course of steroids but is maintaining Tradjenta and Metformin for diabetes  Past Medical History:  Diagnosis Date  . Hypertension   . Nephrolithiasis      Family History  Problem Relation Age of Onset  . Cancer Mother   . Hypertension Other   . Diabetes Other      Social History   Socioeconomic History  . Marital status: Single    Spouse name: Not on file  . Number of children: Not on file  . Years of education: Not on file  . Highest education level: Not on file  Occupational History  . Not on file  Tobacco Use  . Smoking status: Never Smoker  . Smokeless tobacco: Never Used  Vaping Use  . Vaping Use: Never used  Substance and Sexual Activity  . Alcohol use: No  . Drug use: No  . Sexual activity: Not on file  Other Topics Concern  . Not on file  Social History Narrative  . Not on file   Social Determinants of Health   Financial Resource Strain:   . Difficulty of Paying Living Expenses: Not on file  Food Insecurity:   . Worried About Charity fundraiser in the Last Year: Not on file  . Ran Out of Food in the Last Year: Not on file  Transportation Needs:   . Lack of Transportation (Medical): Not on file  . Lack of Transportation (Non-Medical): Not on file  Physical Activity:   . Days  of Exercise per Week: Not on file  . Minutes of Exercise per Session: Not on file  Stress:   . Feeling of Stress : Not on file  Social Connections:   . Frequency of Communication with Friends and Family: Not on file  . Frequency of Social Gatherings with Friends and Family: Not on file  . Attends Religious Services: Not on file  . Active Member of Clubs or Organizations: Not on file  . Attends Archivist Meetings: Not on file  . Marital Status: Not on file  Intimate Partner Violence:   . Fear of Current or Ex-Partner: Not on file  . Emotionally Abused: Not on file  . Physically Abused: Not on file  . Sexually Abused: Not on file     Allergies  Allergen Reactions  . Shellfish Allergy Anaphylaxis and Swelling     Outpatient Medications Prior to Visit  Medication Sig Dispense Refill  . albuterol (VENTOLIN HFA) 108 (90 Base) MCG/ACT inhaler Inhale 2 puffs into the lungs every 4 (four) hours as needed for shortness of breath. 6.7 g 0  .  benzonatate (TESSALON PERLES) 100 MG capsule Take 1 capsule (100 mg total) by mouth every 6 (six) hours as needed for cough. 30 capsule 0  . Multiple Vitamin (MULTIVITAMIN WITH MINERALS) TABS tablet Take 1 tablet by mouth daily.    . Potassium Citrate 15 MEQ (1620 MG) TBCR Take 30 mEq by mouth 2 (two) times daily.    Marland Kitchen amLODipine (NORVASC) 10 MG tablet Take 1 tablet (10 mg total) by mouth daily. 30 tablet 0  . dexamethasone (DECADRON) 6 MG tablet Take 1 tablet (6 mg total) by mouth daily. 5 tablet 0  . linagliptin (TRADJENTA) 5 MG TABS tablet Take 1 tablet (5 mg total) by mouth daily. 30 tablet 0  . metFORMIN (GLUCOPHAGE) 500 MG tablet Take 500 mg by mouth 2 (two) times daily with a meal.     No facility-administered medications prior to visit.      Review of Systems  Constitutional: Positive for activity change and fatigue.  HENT: Positive for congestion and sore throat.   Eyes: Negative.   Respiratory: Positive for cough, shortness of  breath and wheezing.   Cardiovascular: Negative.   Gastrointestinal: Negative.   Endocrine: Negative.   Genitourinary: Negative.   Musculoskeletal: Negative.   Allergic/Immunologic: Negative.   Neurological: Positive for dizziness.  Hematological: Negative.   Psychiatric/Behavioral: Negative.        Objective:   Physical Exam No exam: phone visit  CXR 8/18: FINDINGS: Mildly decreased lung volumes are seen which is likely secondary to the degree of patient inspiration. Mild linear atelectasis is seen within the right perihilar region, mid left lung and left lung base. Mild slightly patchy appearing left basilar atelectasis and/or infiltrate is also seen. There is no evidence of a pleural effusion or pneumothorax. The heart size and mediastinal contours are within normal limits. The visualized skeletal structures are unremarkable.  IMPRESSION: Mild slightly patchy appearing left basilar atelectasis and/or Infiltrate.  BMP Latest Ref Rng & Units 10/03/2019 10/02/2019 10/01/2019  Glucose 70 - 99 mg/dL 178(H) 465(H) 529(HH)  BUN 6 - 20 mg/dL 27(H) 25(H) 27(H)  Creatinine 0.61 - 1.24 mg/dL 1.34(H) 1.26(H) 1.40(H)  Sodium 135 - 145 mmol/L 136 133(L) 133(L)  Potassium 3.5 - 5.1 mmol/L 3.6 4.3 4.0  Chloride 98 - 111 mmol/L 99 95(L) 94(L)  CO2 22 - 32 mmol/L '30 25 26  ' Calcium 8.9 - 10.3 mg/dL 9.0 9.2 8.6(L)   CBC Latest Ref Rng & Units 10/03/2019 10/02/2019 10/01/2019  WBC 4.0 - 10.5 K/uL 10.1 10.2 6.6  Hemoglobin 13.0 - 17.0 g/dL 11.8(L) 12.6(L) 11.2(L)  Hematocrit 39 - 52 % 37.9(L) 39.2 36.8(L)  Platelets 150 - 400 K/uL 440(H) 464(H) 391   Hepatic Function Latest Ref Rng & Units 10/03/2019 10/02/2019 10/01/2019  Total Protein 6.5 - 8.1 g/dL 7.0 7.9 7.4  Albumin 3.5 - 5.0 g/dL 3.3(L) 3.4(L) 3.2(L)  AST 15 - 41 U/L 53(H) 67(H) 31  ALT 0 - 44 U/L 86(H) 72(H) 46(H)  Alk Phosphatase 38 - 126 U/L 50 56 55  Total Bilirubin 0.3 - 1.2 mg/dL 0.5 0.7 0.9           Assessment &  Plan:  I personally reviewed all images and lab data in the [facility] system as well as any outside material available during this office visit and agree with the  radiology impressions.   Hypertension Hypertension poorly controlled will add losartan 100 mg and continue amlodipine 10 mg daily and obtain metabolic profile  Acute hypoxemic respiratory failure due to COVID-19 (  [place]) Acute hypoxic respiratory failure secondary to Covid pneumonia slowly resolving no longer on oxygen  Follow-up chest x-ray at a future date  Pneumonia due to COVID-19 virus Patient is on appropriate therapy for Covid pneumonia this is gradually resolving no further steroids indicated  DM (diabetes mellitus) ([place]) Morbid obesity with diabetes exacerbated by steroids continue Metformin and Tradjenta will obtain for the patient a glucose meter and have this patient come to the office for further assessment on September 27  AKI (acute kidney injury) ([place]) Follow-up metabolic profile  Elevated liver enzymes Follow-up liver enzymes   Leavy was seen today for hospitalization follow-up.  Diagnoses and all orders for this visit:  Pneumonia due to COVID-19 virus -     Comprehensive metabolic panel; Future -     CBC with Differential/Platelet; Future  Elevated liver enzymes -     Comprehensive metabolic panel; Future  AKI (acute kidney injury) ([place]) -     Comprehensive metabolic panel; Future  Type 2 diabetes mellitus without complication, without long-term current use of insulin (HCC) -     Comprehensive metabolic panel; Future  Essential hypertension  Acute hypoxemic respiratory failure due to COVID-19 [facility])  Other orders -     amLODipine (NORVASC) 10 MG tablet; Take 1 tablet (10 mg total) by mouth daily. -     linagliptin (TRADJENTA) 5 MG TABS tablet; Take 1 tablet (5 mg total) by mouth daily. -     metFORMIN (GLUCOPHAGE) 500 MG tablet; Take 1 tablet (500 mg total) by mouth 2 (two) times daily with a  meal. -     losartan (COZAAR) 100 MG tablet; Take 1 tablet (100 mg total) by mouth daily. -     Blood Glucose Monitoring Suppl (ACCU-CHEK GUIDE) w/Device KIT; Test blood sugar twice a day -     glucose blood (ACCU-CHEK GUIDE) test strip; Use as instructed -     Lancets Misc. (ACCU-CHEK FASTCLIX LANCET) KIT; Use to test blood sugar twice a day -     Accu-Chek FastClix Lancets MISC; Use to test blood sugar twice a day    Follow Up Instructions: Patient knows follow-up visit will be obtained September 27 in the office   I discussed the assessment and treatment plan with the patient. The patient was provided an opportunity to ask questions and all were answered. The patient agreed with the plan and demonstrated an understanding of the instructions.   The patient was advised to call back or seek an in-person evaluation if the symptoms worsen or if the condition fails to improve as anticipated.  I provided 30 minutes of non-face-to-face time during this encounter  including  median intraservice time , review of notes, labs, imaging, medications  and explaining diagnosis and management to the patient .    Saralyn Pilar Lamount Cohen

## 2019-10-27 NOTE — Assessment & Plan Note (Signed)
Follow-up metabolic profile

## 2019-10-27 NOTE — Assessment & Plan Note (Signed)
Follow-up liver enzymes

## 2019-10-27 NOTE — Assessment & Plan Note (Signed)
Morbid obesity with diabetes exacerbated by steroids continue Metformin and Tradjenta will obtain for the patient a glucose meter and have this patient come to the office for further assessment on September 27

## 2019-10-27 NOTE — Assessment & Plan Note (Signed)
Hypertension poorly controlled will add losartan 100 mg and continue amlodipine 10 mg daily and obtain metabolic profile

## 2019-10-27 NOTE — Assessment & Plan Note (Signed)
Patient is on appropriate therapy for Covid pneumonia this is gradually resolving no further steroids indicated

## 2019-11-11 ENCOUNTER — Telehealth: Payer: Self-pay | Admitting: Critical Care Medicine

## 2019-11-11 NOTE — Telephone Encounter (Signed)
Pt called back this am because he had not heard anything about an appt, even though it said crm resolved. Someone at the office helped me schedule the pt for 10/11 with Dr Joya Gaskins.

## 2019-11-11 NOTE — Telephone Encounter (Signed)
Copied from [place] [phone]. Topic: Appointment Scheduling - Scheduling Inquiry for Clinic >> Nov 08, 2019  8:25 AM Scherrie Gerlach wrote: Reason for CRM: pt was supposed to see Dr Joya Gaskins today. In dr notes it does say "Patient knows follow-up visit will be obtained September 27 in the office" Pt is not on schedule.  Please advise. Pt wants to know if he should come and wait?

## 2019-11-22 ENCOUNTER — Other Ambulatory Visit: Payer: Self-pay

## 2019-11-22 ENCOUNTER — Encounter: Payer: Self-pay | Admitting: Critical Care Medicine

## 2019-11-22 ENCOUNTER — Ambulatory Visit: Payer: Managed Care, Other (non HMO) | Attending: Critical Care Medicine | Admitting: Critical Care Medicine

## 2019-11-22 VITALS — BP 127/93 | HR 99 | Temp 97.9°F | Resp 20 | Ht 69.0 in | Wt 341.2 lb

## 2019-11-22 DIAGNOSIS — Z23 Encounter for immunization: Secondary | ICD-10-CM | POA: Diagnosis not present

## 2019-11-22 DIAGNOSIS — U071 COVID-19: Secondary | ICD-10-CM

## 2019-11-22 DIAGNOSIS — E119 Type 2 diabetes mellitus without complications: Secondary | ICD-10-CM | POA: Diagnosis not present

## 2019-11-22 DIAGNOSIS — Z8701 Personal history of pneumonia (recurrent): Secondary | ICD-10-CM | POA: Diagnosis not present

## 2019-11-22 DIAGNOSIS — Z1159 Encounter for screening for other viral diseases: Secondary | ICD-10-CM | POA: Diagnosis not present

## 2019-11-22 DIAGNOSIS — I10 Essential (primary) hypertension: Secondary | ICD-10-CM

## 2019-11-22 DIAGNOSIS — R748 Abnormal levels of other serum enzymes: Secondary | ICD-10-CM

## 2019-11-22 DIAGNOSIS — J9601 Acute respiratory failure with hypoxia: Secondary | ICD-10-CM

## 2019-11-22 DIAGNOSIS — Z9189 Other specified personal risk factors, not elsewhere classified: Secondary | ICD-10-CM | POA: Diagnosis not present

## 2019-11-22 DIAGNOSIS — J1282 Pneumonia due to coronavirus disease 2019: Secondary | ICD-10-CM

## 2019-11-22 DIAGNOSIS — N179 Acute kidney failure, unspecified: Secondary | ICD-10-CM

## 2019-11-22 LAB — GLUCOSE, POCT (MANUAL RESULT ENTRY): POC Glucose: 210 mg/dl — AB (ref 70–99)

## 2019-11-22 NOTE — Assessment & Plan Note (Signed)
Hypoxic respiratory failure due to COVID-19 resolved

## 2019-11-22 NOTE — Assessment & Plan Note (Signed)
Follow-up hepatic function panel at this visit

## 2019-11-22 NOTE — Patient Instructions (Addendum)
No change in medications for now  Blood work will be obtained to include metabolic panel and blood count  You received the flu vaccine, Pneumovax 23 valent, and flu shot  You can now get the [place] vaccine for Covid in the next 2 weeks we will give you a list of sites that she can consider to get your vaccine, always remember [facility] will have this for you if needed  Begin an exercise program remember to purse lip breathe when you are walking  Begin moving your body at least 3-4 times a week for 30 minutes  Return to see Dr. Joya Gaskins again in 6 weeks for recheck  Follow healthy diet as noted below  Obtain a blood pressure cuff with large cuff to measure pressure in your upper arm   Diabetes Mellitus and Nutrition, Adult When you have diabetes (diabetes mellitus), it is very important to have healthy eating habits because your blood sugar (glucose) levels are greatly affected by what you eat and drink. Eating healthy foods in the appropriate amounts, at about the same times every day, can help you:  Control your blood glucose.  Lower your risk of heart disease.  Improve your blood pressure.  Reach or maintain a healthy weight. Every person with diabetes is different, and each person has different needs for a meal plan. Your health care provider may recommend that you work with a diet and nutrition specialist (dietitian) to make a meal plan that is best for you. Your meal plan may vary depending on factors such as:  The calories you need.  The medicines you take.  Your weight.  Your blood glucose, blood pressure, and cholesterol levels.  Your activity level.  Other health conditions you have, such as heart or kidney disease. How do carbohydrates affect me? Carbohydrates, also called carbs, affect your blood glucose level more than any other type of food. Eating carbs naturally raises the amount of glucose in your blood. Carb counting is a method for keeping track of how many  carbs you eat. Counting carbs is important to keep your blood glucose at a healthy level, especially if you use insulin or take certain oral diabetes medicines. It is important to know how many carbs you can safely have in each meal. This is different for every person. Your dietitian can help you calculate how many carbs you should have at each meal and for each snack. Foods that contain carbs include:  Bread, cereal, rice, pasta, and crackers.  Potatoes and corn.  Peas, beans, and lentils.  Milk and yogurt.  Fruit and juice.  Desserts, such as cakes, cookies, ice cream, and candy. How does alcohol affect me? Alcohol can cause a sudden decrease in blood glucose (hypoglycemia), especially if you use insulin or take certain oral diabetes medicines. Hypoglycemia can be a life-threatening condition. Symptoms of hypoglycemia (sleepiness, dizziness, and confusion) are similar to symptoms of having too much alcohol. If your health care provider says that alcohol is safe for you, follow these guidelines:  Limit alcohol intake to no more than 1 drink per day for nonpregnant women and 2 drinks per day for men. One drink equals 12 oz of beer, 5 oz of wine, or 1 oz of hard liquor.  Do not drink on an empty stomach.  Keep yourself hydrated with water, diet soda, or unsweetened iced tea.  Keep in mind that regular soda, juice, and other mixers may contain a lot of sugar and must be counted as carbs. What are  tips for following this plan?  Reading food labels  Start by checking the serving size on the "Nutrition Facts" label of packaged foods and drinks. The amount of calories, carbs, fats, and other nutrients listed on the label is based on one serving of the item. Many items contain more than one serving per package.  Check the total grams (g) of carbs in one serving. You can calculate the number of servings of carbs in one serving by dividing the total carbs by 15. For example, if a food has 30  g of total carbs, it would be equal to 2 servings of carbs.  Check the number of grams (g) of saturated and trans fats in one serving. Choose foods that have low or no amount of these fats.  Check the number of milligrams (mg) of salt (sodium) in one serving. Most people should limit total sodium intake to less than 2,300 mg per day.  Always check the nutrition information of foods labeled as "low-fat" or "nonfat". These foods may be higher in added sugar or refined carbs and should be avoided.  Talk to your dietitian to identify your daily goals for nutrients listed on the label. Shopping  Avoid buying canned, premade, or processed foods. These foods tend to be high in fat, sodium, and added sugar.  Shop around the outside edge of the grocery store. This includes fresh fruits and vegetables, bulk grains, fresh meats, and fresh dairy. Cooking  Use low-heat cooking methods, such as baking, instead of high-heat cooking methods like deep frying.  Cook using healthy oils, such as olive, canola, or sunflower oil.  Avoid cooking with butter, cream, or high-fat meats. Meal planning  Eat meals and snacks regularly, preferably at the same times every day. Avoid going long periods of time without eating.  Eat foods high in fiber, such as fresh fruits, vegetables, beans, and whole grains. Talk to your dietitian about how many servings of carbs you can eat at each meal.  Eat 4-6 ounces (oz) of lean protein each day, such as lean meat, chicken, fish, eggs, or tofu. One oz of lean protein is equal to: ? 1 oz of meat, chicken, or fish. ? 1 egg. ?  cup of tofu.  Eat some foods each day that contain healthy fats, such as avocado, nuts, seeds, and fish. Lifestyle  Check your blood glucose regularly.  Exercise regularly as told by your health care provider. This may include: ? 150 minutes of moderate-intensity or vigorous-intensity exercise each week. This could be brisk walking, biking, or water  aerobics. ? Stretching and doing strength exercises, such as yoga or weightlifting, at least 2 times a week.  Take medicines as told by your health care provider.  Do not use any products that contain nicotine or tobacco, such as cigarettes and e-cigarettes. If you need help quitting, ask your health care provider.  Work with a Social worker or diabetes educator to identify strategies to manage stress and any emotional and social challenges. Questions to ask a health care provider  Do I need to meet with a diabetes educator?  Do I need to meet with a dietitian?  What number can I call if I have questions?  When are the best times to check my blood glucose? Where to find more information:  American Diabetes Association: diabetes.org  Academy of Nutrition and Dietetics: www.eatright.[employer] of Diabetes and Digestive and Kidney Diseases (NIH): [employer] Summary  A healthy meal plan will help you control your  blood glucose and maintain a healthy lifestyle.  Working with a diet and nutrition specialist (dietitian) can help you make a meal plan that is best for you.  Keep in mind that carbohydrates (carbs) and alcohol have immediate effects on your blood glucose levels. It is important to count carbs and to use alcohol carefully. This information is not intended to replace advice given to you by your health care provider. Make sure you discuss any questions you have with your health care provider. Document Revised: 01/10/2017 Document Reviewed: 03/04/2016 Elsevier Patient Education  [place].  Hypertension, Adult High blood pressure (hypertension) is when the force of blood pumping through the arteries is too strong. The arteries are the blood vessels that carry blood from the heart throughout the body. Hypertension forces the heart to work harder to pump blood and may cause arteries to become narrow or stiff. Untreated or uncontrolled hypertension can  cause a heart attack, heart failure, a stroke, kidney disease, and other problems. A blood pressure reading consists of a higher number over a lower number. Ideally, your blood pressure should be below 120/80. The first ("top") number is called the systolic pressure. It is a measure of the pressure in your arteries as your heart beats. The second ("bottom") number is called the diastolic pressure. It is a measure of the pressure in your arteries as the heart relaxes. What are the causes? The exact cause of this condition is not known. There are some conditions that result in or are related to high blood pressure. What increases the risk? Some risk factors for high blood pressure are under your control. The following factors may make you more likely to develop this condition:  Smoking.  Having type 2 diabetes mellitus, high cholesterol, or both.  Not getting enough exercise or physical activity.  Being overweight.  Having too much fat, sugar, calories, or salt (sodium) in your diet.  Drinking too much alcohol. Some risk factors for high blood pressure may be difficult or impossible to change. Some of these factors include:  Having chronic kidney disease.  Having a family history of high blood pressure.  Age. Risk increases with age.  Race. You may be at higher risk if you are African American.  Gender. Men are at higher risk than women before age 49. After age 21, women are at higher risk than men.  Having obstructive sleep apnea.  Stress. What are the signs or symptoms? High blood pressure may not cause symptoms. Very high blood pressure (hypertensive crisis) may cause:  Headache.  Anxiety.  Shortness of breath.  Nosebleed.  Nausea and vomiting.  Vision changes.  Severe chest pain.  Seizures. How is this diagnosed? This condition is diagnosed by measuring your blood pressure while you are seated, with your arm resting on a flat surface, your legs uncrossed, and  your feet flat on the floor. The cuff of the blood pressure monitor will be placed directly against the skin of your upper arm at the level of your heart. It should be measured at least twice using the same arm. Certain conditions can cause a difference in blood pressure between your right and left arms. Certain factors can cause blood pressure readings to be lower or higher than normal for a short period of time:  When your blood pressure is higher when you are in a health care provider's office than when you are at home, this is called white coat hypertension. Most people with this condition do not need  medicines.  When your blood pressure is higher at home than when you are in a health care provider's office, this is called masked hypertension. Most people with this condition may need medicines to control blood pressure. If you have a high blood pressure reading during one visit or you have normal blood pressure with other risk factors, you may be asked to:  Return on a different day to have your blood pressure checked again.  Monitor your blood pressure at home for 1 week or longer. If you are diagnosed with hypertension, you may have other blood or imaging tests to help your health care provider understand your overall risk for other conditions. How is this treated? This condition is treated by making healthy lifestyle changes, such as eating healthy foods, exercising more, and reducing your alcohol intake. Your health care provider may prescribe medicine if lifestyle changes are not enough to get your blood pressure under control, and if:  Your systolic blood pressure is above 130.  Your diastolic blood pressure is above 80. Your personal target blood pressure may vary depending on your medical conditions, your age, and other factors. Follow these instructions at home: Eating and drinking   Eat a diet that is high in fiber and potassium, and low in sodium, added sugar, and fat. An  example eating plan is called the DASH (Dietary Approaches to Stop Hypertension) diet. To eat this way: ? Eat plenty of fresh fruits and vegetables. Try to fill one half of your plate at each meal with fruits and vegetables. ? Eat whole grains, such as whole-wheat pasta, brown rice, or whole-grain bread. Fill about one fourth of your plate with whole grains. ? Eat or drink low-fat dairy products, such as skim milk or low-fat yogurt. ? Avoid fatty cuts of meat, processed or cured meats, and poultry with skin. Fill about one fourth of your plate with lean proteins, such as fish, chicken without skin, beans, eggs, or tofu. ? Avoid pre-made and processed foods. These tend to be higher in sodium, added sugar, and fat.  Reduce your daily sodium intake. Most people with hypertension should eat less than 1,500 mg of sodium a day.  Do not drink alcohol if: ? Your health care provider tells you not to drink. ? You are pregnant, may be pregnant, or are planning to become pregnant.  If you drink alcohol: ? Limit how much you use to:  0-1 drink a day for women.  0-2 drinks a day for men. ? Be aware of how much alcohol is in your drink. In the [place] one drink equals one 12 oz bottle of beer (355 mL), one 5 oz glass of wine (148 mL), or one 1 oz glass of hard liquor (44 mL). Lifestyle   Work with your health care provider to maintain a healthy body weight or to lose weight. Ask what an ideal weight is for you.  Get at least 30 minutes of exercise most days of the week. Activities may include walking, swimming, or biking.  Include exercise to strengthen your muscles (resistance exercise), such as Pilates or lifting weights, as part of your weekly exercise routine. Try to do these types of exercises for 30 minutes at least 3 days a week.  Do not use any products that contain nicotine or tobacco, such as cigarettes, e-cigarettes, and chewing tobacco. If you need help quitting, ask your health care  provider.  Monitor your blood pressure at home as told by your health care provider.  Keep all follow-up visits as told by your health care provider. This is important. Medicines  Take over-the-counter and prescription medicines only as told by your health care provider. Follow directions carefully. Blood pressure medicines must be taken as prescribed.  Do not skip doses of blood pressure medicine. Doing this puts you at risk for problems and can make the medicine less effective.  Ask your health care provider about side effects or reactions to medicines that you should watch for. Contact a health care provider if you:  Think you are having a reaction to a medicine you are taking.  Have headaches that keep coming back (recurring).  Feel dizzy.  Have swelling in your ankles.  Have trouble with your vision. Get help right away if you:  Develop a severe headache or confusion.  Have unusual weakness or numbness.  Feel faint.  Have severe pain in your chest or abdomen.  Vomit repeatedly.  Have trouble breathing. Summary  Hypertension is when the force of blood pumping through your arteries is too strong. If this condition is not controlled, it may put you at risk for serious complications.  Your personal target blood pressure may vary depending on your medical conditions, your age, and other factors. For most people, a normal blood pressure is less than 120/80.  Hypertension is treated with lifestyle changes, medicines, or a combination of both. Lifestyle changes include losing weight, eating a healthy, low-sodium diet, exercising more, and limiting alcohol. This information is not intended to replace advice given to you by your health care provider. Make sure you discuss any questions you have with your health care provider. Document Revised: 10/08/2017 Document Reviewed: 10/08/2017 Elsevier Patient Education  2020 [employer].

## 2019-11-22 NOTE — Assessment & Plan Note (Signed)
Follow-up metabolic profile at this visit

## 2019-11-22 NOTE — Assessment & Plan Note (Addendum)
Pneumonia due to COVID-19 resolved  Discontinue albuterol and Tessalon  The patient will receive flu vaccine Pneumovax 23 valent and tetanus vaccine this visit and may receive his Covid vaccine in 2 weeks

## 2019-11-22 NOTE — Progress Notes (Signed)
 Subjective:    Patient ID: Jason Bruce, male    DOB: 03/19/1975, 43 y.o.   MRN: [id number]  History of Present Illness:  10/27/2019 This is a pleasant African-American male who was admitted for Covid viral infection with associated pneumonia in August of this year.  Below is a copy of the discharge summary.  This is a telephone visit as the patient cannot achieve a video access on his phone   11/22/2019 This patient was seen prior by way of a phone visit on 15 September comes in the office for direct exam.  Overall he is improved.  He is having less shortness of breath.  His blood sugars have been in the 200 range.  Blood pressure is lower.  On arrival blood pressure is good at 127/93.  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  He is working from home at this time.  He does maintain the Metformin and Tradjenta.  He has no symptoms referable to the COVID-19 infection.  Past Medical History:  Diagnosis Date  . Acute hypoxemic respiratory failure due to COVID-19 (HCC) 09/30/2019  . Hypertension   . Nephrolithiasis   . Pneumonia due to COVID-19 virus 09/30/2019     Family History  Problem Relation Age of Onset  . Cancer Mother   . Hypertension Other   . Diabetes Other      Social History   Socioeconomic History  . Marital status: Single    Spouse name: Not on file  . Number of children: Not on file  . Years of education: Not on file  . Highest education level: Not on file  Occupational History  . Not on file  Tobacco Use  . Smoking status: Never Smoker  . Smokeless tobacco: Never Used  Vaping Use  . Vaping Use: Never used  Substance and Sexual Activity  . Alcohol use: No  . Drug use: No  . Sexual activity: Not on file  Other Topics Concern  . Not on file  Social History Narrative  . Not on file   Social Determinants of Health   Financial Resource Strain:   . Difficulty of Paying Living Expenses: Not on file  Food Insecurity:   . Worried About Running Out of  Food in the Last Year: Not on file  . Ran Out of Food in the Last Year: Not on file  Transportation Needs:   . Lack of Transportation (Medical): Not on file  . Lack of Transportation (Non-Medical): Not on file  Physical Activity:   . Days of Exercise per Week: Not on file  . Minutes of Exercise per Session: Not on file  Stress:   . Feeling of Stress : Not on file  Social Connections:   . Frequency of Communication with Friends and Family: Not on file  . Frequency of Social Gatherings with Friends and Family: Not on file  . Attends Religious Services: Not on file  . Active Member of Clubs or Organizations: Not on file  . Attends Club or Organization Meetings: Not on file  . Marital Status: Not on file  Intimate Partner Violence:   . Fear of Current or Ex-Partner: Not on file  . Emotionally Abused: Not on file  . Physically Abused: Not on file  . Sexually Abused: Not on file     Allergies  Allergen Reactions  . Shellfish Allergy Anaphylaxis and Swelling     Outpatient Medications Prior to Visit  Medication Sig Dispense Refill  . Accu-Chek FastClix Lancets   MISC Use to test blood sugar twice a day 100 each 1  . amLODipine (NORVASC) 10 MG tablet Take 1 tablet (10 mg total) by mouth daily. 90 tablet 1  . Blood Glucose Monitoring Suppl (ACCU-CHEK GUIDE) w/Device KIT Test blood sugar twice a day 1 kit 0  . glucose blood (ACCU-CHEK GUIDE) test strip Use as instructed 100 each 12  . Lancets Misc. (ACCU-CHEK FASTCLIX LANCET) KIT Use to test blood sugar twice a day 1 kit 0  . linagliptin (TRADJENTA) 5 MG TABS tablet Take 1 tablet (5 mg total) by mouth daily. 30 tablet 1  . losartan (COZAAR) 100 MG tablet Take 1 tablet (100 mg total) by mouth daily. 90 tablet 3  . metFORMIN (GLUCOPHAGE) 500 MG tablet Take 1 tablet (500 mg total) by mouth 2 (two) times daily with a meal. 60 tablet 3  . Multiple Vitamin (MULTIVITAMIN WITH MINERALS) TABS tablet Take 1 tablet by mouth daily.    . Potassium  Citrate 15 MEQ (1620 MG) TBCR Take 30 mEq by mouth 2 (two) times daily.    . albuterol (VENTOLIN HFA) 108 (90 Base) MCG/ACT inhaler Inhale 2 puffs into the lungs every 4 (four) hours as needed for shortness of breath. (Patient not taking: Reported on 11/22/2019) 6.7 g 0  . benzonatate (TESSALON PERLES) 100 MG capsule Take 1 capsule (100 mg total) by mouth every 6 (six) hours as needed for cough. (Patient not taking: Reported on 11/22/2019) 30 capsule 0   No facility-administered medications prior to visit.      Review of Systems  Constitutional: Negative for activity change and fatigue.  HENT: Negative for congestion and sore throat.   Eyes: Negative.   Respiratory: Negative for cough, shortness of breath and wheezing.   Cardiovascular: Negative.   Gastrointestinal: Negative.   Endocrine: Negative.   Genitourinary: Negative.   Musculoskeletal: Negative.   Allergic/Immunologic: Negative.   Neurological: Negative for dizziness.  Hematological: Negative.   Psychiatric/Behavioral: Negative for agitation, confusion, decreased concentration and sleep disturbance. The patient is not nervous/anxious and is not hyperactive.        Objective:   Physical Exam  Vitals:   11/22/19 1115  BP: (!) 127/93  Pulse: 99  Resp: 20  Temp: 97.9 F (36.6 C)  TempSrc: Temporal  SpO2: 96%  Weight: (!) 341 lb 3.2 oz (154.8 kg)  Height: 5' 9" (1.753 m)   ambulatory walk around the office x1 lap showed no significant desaturation Gen: Pleasant, obese  in no distress,  normal affect  ENT: No lesions,  mouth clear,  oropharynx clear, no postnasal drip  Neck: No JVD, no TMG, no carotid bruits  Lungs: No use of accessory muscles, no dullness to percussion, clear without rales or rhonchi  Cardiovascular: RRR, heart sounds normal, no murmur or gallops, no peripheral edema  Abdomen: soft and NT, no HSM,  BS normal  Musculoskeletal: No deformities, no cyanosis or clubbing  Neuro: alert, non  focal  Skin: Warm, no lesions or rashes  CXR 8/18: FINDINGS: Mildly decreased lung volumes are seen which is likely secondary to the degree of patient inspiration. Mild linear atelectasis is seen within the right perihilar region, mid left lung and left lung base. Mild slightly patchy appearing left basilar atelectasis and/or infiltrate is also seen. There is no evidence of a pleural effusion or pneumothorax. The heart size and mediastinal contours are within normal limits. The visualized skeletal structures are unremarkable.  IMPRESSION: Mild slightly patchy appearing left basilar atelectasis and/or Infiltrate.    BMP Latest Ref Rng & Units 10/03/2019 10/02/2019 10/01/2019  Glucose 70 - 99 mg/dL 178(H) 465(H) 529(HH)  BUN 6 - 20 mg/dL 27(H) 25(H) 27(H)  Creatinine 0.61 - 1.24 mg/dL 1.34(H) 1.26(H) 1.40(H)  Sodium 135 - 145 mmol/L 136 133(L) 133(L)  Potassium 3.5 - 5.1 mmol/L 3.6 4.3 4.0  Chloride 98 - 111 mmol/L 99 95(L) 94(L)  CO2 22 - 32 mmol/L _0 Calcium 8.9 - 10.3 mg/dL 9.0 9.2 8.6(L)   CBC Latest Ref Rng & Units 10/03/2019 10/02/2019 10/01/2019  WBC 4.0 - 10.5 K/uL 10.1 10.2 6.6  Hemoglobin 13.0 - 17.0 g/dL 11.8(L) 12.6(L) 11.2(L)  Hematocrit 39 - 52 % 37.9(L) 39.2 36.8(L)  Platelets 150 - 400 K/uL 440(H) 464(H) 391   Hepatic Function Latest Ref Rng & Units 10/03/2019 10/02/2019 10/01/2019  Total Protein 6.5 - 8.1 g/dL 7.0 7.9 7.4  Albumin 3.5 - 5.0 g/dL 3.3(L) 3.4(L) 3.2(L)  AST 15 - 41 U/L 53(H) 67(H) 31  ALT 0 - 44 U/L 86(H) 72(H) 46(H)  Alk Phosphatase 38 - 126 U/L 50 56 55  Total Bilirubin 0.3 - 1.2 mg/dL 0.5 0.7 0.9           Assessment & Plan:  I personally reviewed all images and lab data in the [facility] system as well as any outside material available during this office visit and agree with the  radiology impressions.   Pneumonia due to COVID-19 virus Pneumonia due to COVID-19 resolved  Discontinue albuterol and Tessalon  The patient will receive flu  vaccine Pneumovax 23 valent and tetanus vaccine this visit and may receive his Covid vaccine in 2 weeks  DM (diabetes mellitus) ([place]) Type 2 diabetes plus minus control  Follow-up CBC metabolic profile continue to monitor continue Tradjenta and Metformin  Hypertension Getting to goal with blood pressure well continue current medications  Acute hypoxemic respiratory failure due to COVID-19 ([place]) Hypoxic respiratory failure due to COVID-19 resolved  AKI (acute kidney injury) ([place]) Follow-up metabolic profile at this visit  Elevated liver enzymes Follow-up hepatic function panel at this visit   Ioannis was seen today for hospitalization follow-up.  Diagnoses and all orders for this visit:  Type 2 diabetes mellitus without complication, without long-term current use of insulin (HCC) -     Glucose (CBG) -     Comprehensive metabolic panel  Need for hepatitis C screening test -     HCV Ab w/Rflx to Verification  Pneumonia due to COVID-19 virus -     CBC with Differential/Platelet  Primary hypertension -     CBC with Differential/Platelet  Need for tetanus, diphtheria, and acellular pertussis (Tdap) vaccine -     Tdap vaccine greater than or equal to 7yo IM  History of pneumonia as indication for 23-polyvalent pneumococcal polysaccharide vaccine -     Pneumococcal polysaccharide vaccine 23-valent greater than or equal to 2yo subcutaneous/IM  Need for immunization against influenza -     Flu Vaccine QUAD 36+ mos IM  Acute hypoxemic respiratory failure due to COVID-19 (HCC)  AKI (acute kidney injury) ([place])  Elevated liver enzymes

## 2019-11-22 NOTE — Progress Notes (Signed)
Wants flu/Tdap/PNA vaccines today if appropriate   Interested in [place] vaccine as well but wishes to discuss w/ provider 1st

## 2019-11-22 NOTE — Assessment & Plan Note (Addendum)
Getting to goal with blood pressure well continue current medications

## 2019-11-22 NOTE — Assessment & Plan Note (Signed)
Type 2 diabetes plus minus control  Follow-up CBC metabolic profile continue to monitor continue Tradjenta and Metformin

## 2019-11-23 ENCOUNTER — Other Ambulatory Visit: Payer: Self-pay | Admitting: Critical Care Medicine

## 2019-11-23 LAB — COMPREHENSIVE METABOLIC PANEL
ALT: 24 IU/L (ref 0–44)
AST: 17 IU/L (ref 0–40)
Albumin/Globulin Ratio: 1.5 (ref 1.2–2.2)
Albumin: 4.6 g/dL (ref 4.0–5.0)
Alkaline Phosphatase: 72 IU/L (ref 44–121)
BUN/Creatinine Ratio: 19 (ref 9–20)
BUN: 17 mg/dL (ref 6–24)
Bilirubin Total: <0.2 mg/dL (ref 0.0–1.2)
CO2: 24 mmol/L (ref 20–29)
Calcium: 9.9 mg/dL (ref 8.7–10.2)
Chloride: 101 mmol/L (ref 96–106)
Creatinine, Ser: 0.89 mg/dL (ref 0.76–1.27)
GFR calc Af Amer: 119 mL/min/{1.73_m2} (ref >59)
GFR calc non Af Amer: 103 mL/min/{1.73_m2} (ref >59)
Globulin, Total: 3 g/dL (ref 1.5–4.5)
Glucose: 177 mg/dL — ABNORMAL HIGH (ref 65–99)
Potassium: 4.3 mmol/L (ref 3.5–5.2)
Sodium: 139 mmol/L (ref 134–144)
Total Protein: 7.6 g/dL (ref 6.0–8.5)

## 2019-11-23 LAB — CBC WITH DIFFERENTIAL/PLATELET
Basophils Absolute: 0.1 10*3/uL (ref 0.0–0.2)
Basos: 1 %
EOS (ABSOLUTE): 0.1 10*3/uL (ref 0.0–0.4)
Eos: 2 %
Hematocrit: 33.7 % — ABNORMAL LOW (ref 37.5–51.0)
Hemoglobin: 11.5 g/dL — ABNORMAL LOW (ref 13.0–17.7)
Immature Grans (Abs): 0 10*3/uL (ref 0.0–0.1)
Immature Granulocytes: 0 %
Lymphocytes Absolute: 2 10*3/uL (ref 0.7–3.1)
Lymphs: 26 %
MCH: 29.6 pg (ref 26.6–33.0)
MCHC: 34.1 g/dL (ref 31.5–35.7)
MCV: 87 fL (ref 79–97)
Monocytes Absolute: 0.6 10*3/uL (ref 0.1–0.9)
Monocytes: 8 %
Neutrophils Absolute: 4.9 10*3/uL (ref 1.4–7.0)
Neutrophils: 63 %
Platelets: 250 10*3/uL (ref 150–450)
RBC: 3.89 x10E6/uL — ABNORMAL LOW (ref 4.14–5.80)
RDW: 14.6 % (ref 11.6–15.4)
WBC: 7.8 10*3/uL (ref 3.4–10.8)

## 2019-11-23 LAB — HCV INTERPRETATION

## 2019-11-23 LAB — HCV AB W/RFLX TO VERIFICATION: HCV Ab: <0.1 s/co ratio (ref 0.0–0.9)

## 2019-11-23 MED ORDER — ATORVASTATIN CALCIUM 20 MG PO TABS: 20.0000 mg | ORAL_TABLET | Freq: Every day | ORAL | 3 refills | Status: DC

## 2019-11-23 NOTE — Progress Notes (Signed)
ator

## 2020-01-04 ENCOUNTER — Ambulatory Visit: Payer: Managed Care, Other (non HMO) | Attending: Critical Care Medicine | Admitting: Critical Care Medicine

## 2020-01-04 ENCOUNTER — Encounter: Payer: Self-pay | Admitting: Critical Care Medicine

## 2020-01-04 ENCOUNTER — Other Ambulatory Visit: Payer: Self-pay

## 2020-01-04 VITALS — BP 124/82 | HR 97 | Wt 343.0 lb

## 2020-01-04 DIAGNOSIS — I10 Essential (primary) hypertension: Secondary | ICD-10-CM | POA: Diagnosis not present

## 2020-01-04 DIAGNOSIS — R748 Abnormal levels of other serum enzymes: Secondary | ICD-10-CM | POA: Diagnosis not present

## 2020-01-04 DIAGNOSIS — N179 Acute kidney failure, unspecified: Secondary | ICD-10-CM | POA: Diagnosis not present

## 2020-01-04 DIAGNOSIS — E119 Type 2 diabetes mellitus without complications: Secondary | ICD-10-CM

## 2020-01-04 LAB — POCT GLYCOSYLATED HEMOGLOBIN (HGB A1C): Hemoglobin A1C: 9.8 % — AB (ref 4.0–5.6)

## 2020-01-04 LAB — GLUCOSE, POCT (MANUAL RESULT ENTRY): POC Glucose: 295 mg/dl — AB (ref 70–99)

## 2020-01-04 MED ORDER — METFORMIN HCL 500 MG PO TABS
500.0000 mg | ORAL_TABLET | Freq: Two times a day (BID) | ORAL | 3 refills | Status: DC
Start: 2020-01-04 — End: 2020-02-01

## 2020-01-04 MED ORDER — LOSARTAN POTASSIUM 100 MG PO TABS
100.0000 mg | ORAL_TABLET | Freq: Every day | ORAL | 3 refills | Status: DC
Start: 2020-01-04 — End: 2020-04-24

## 2020-01-04 MED ORDER — POTASSIUM CITRATE ER 15 MEQ (1620 MG) PO TBCR
2.0000 | EXTENDED_RELEASE_TABLET | Freq: Two times a day (BID) | ORAL | 3 refills | Status: DC
Start: 2020-01-04 — End: 2020-01-07

## 2020-01-04 MED ORDER — LINAGLIPTIN 5 MG PO TABS
5.0000 mg | ORAL_TABLET | Freq: Every day | ORAL | 1 refills | Status: DC
Start: 2020-01-04 — End: 2020-01-05

## 2020-01-04 MED ORDER — AMLODIPINE BESYLATE 10 MG PO TABS
10.0000 mg | ORAL_TABLET | Freq: Every day | ORAL | 1 refills | Status: DC
Start: 2020-01-04 — End: 2020-04-24

## 2020-01-04 MED ORDER — ATORVASTATIN CALCIUM 20 MG PO TABS
20.0000 mg | ORAL_TABLET | Freq: Every day | ORAL | 3 refills | Status: DC
Start: 2020-01-04 — End: 2020-03-10

## 2020-01-04 NOTE — Assessment & Plan Note (Signed)
Renal function abnormalities has resolved

## 2020-01-04 NOTE — Patient Instructions (Signed)
Resume Tradjenta 1 daily this was sent to your pharmacy Continue Metformin twice daily I am connecting you with Lurena Joiner our clinical pharmacist I like for you to follow-up with him on your diabetes All your medications were sent to your local pharmacy for refills No change in your blood pressure medicines  Please obtain a Covid vaccine as outlined below number to call COVID-19 Vaccine Information can be found at: [email or web address] For questions related to vaccine distribution or appointments, please email [email or web address] or call [phone].    Follow healthy diet as outlined below  Return to Dr. Joya Gaskins 2 months   Diabetes Mellitus and Nutrition, Adult When you have diabetes (diabetes mellitus), it is very important to have healthy eating habits because your blood sugar (glucose) levels are greatly affected by what you eat and drink. Eating healthy foods in the appropriate amounts, at about the same times every day, can help you:  Control your blood glucose.  Lower your risk of heart disease.  Improve your blood pressure.  Reach or maintain a healthy weight. Every person with diabetes is different, and each person has different needs for a meal plan. Your health care provider may recommend that you work with a diet and nutrition specialist (dietitian) to make a meal plan that is best for you. Your meal plan may vary depending on factors such as:  The calories you need.  The medicines you take.  Your weight.  Your blood glucose, blood pressure, and cholesterol levels.  Your activity level.  Other health conditions you have, such as heart or kidney disease. How do carbohydrates affect me? Carbohydrates, also called carbs, affect your blood glucose level more than any other type of food. Eating carbs naturally raises the amount of glucose in your blood. Carb counting is a method for keeping track of how many carbs  you eat. Counting carbs is important to keep your blood glucose at a healthy level, especially if you use insulin or take certain oral diabetes medicines. It is important to know how many carbs you can safely have in each meal. This is different for every person. Your dietitian can help you calculate how many carbs you should have at each meal and for each snack. Foods that contain carbs include:  Bread, cereal, rice, pasta, and crackers.  Potatoes and corn.  Peas, beans, and lentils.  Milk and yogurt.  Fruit and juice.  Desserts, such as cakes, cookies, ice cream, and candy. How does alcohol affect me? Alcohol can cause a sudden decrease in blood glucose (hypoglycemia), especially if you use insulin or take certain oral diabetes medicines. Hypoglycemia can be a life-threatening condition. Symptoms of hypoglycemia (sleepiness, dizziness, and confusion) are similar to symptoms of having too much alcohol. If your health care provider says that alcohol is safe for you, follow these guidelines:  Limit alcohol intake to no more than 1 drink per day for nonpregnant women and 2 drinks per day for men. One drink equals 12 oz of beer, 5 oz of wine, or 1 oz of hard liquor.  Do not drink on an empty stomach.  Keep yourself hydrated with water, diet soda, or unsweetened iced tea.  Keep in mind that regular soda, juice, and other mixers may contain a lot of sugar and must be counted as carbs. What are tips for following this plan?  Reading food labels  Start by checking the serving size on the "Nutrition Facts" label of packaged foods and drinks. The amount of calories, carbs, fats, and  other nutrients listed on the label is based on one serving of the item. Many items contain more than one serving per package.  Check the total grams (g) of carbs in one serving. You can calculate the number of servings of carbs in one serving by dividing the total carbs by 15. For example, if a food has 30 g of  total carbs, it would be equal to 2 servings of carbs.  Check the number of grams (g) of saturated and trans fats in one serving. Choose foods that have low or no amount of these fats.  Check the number of milligrams (mg) of salt (sodium) in one serving. Most people should limit total sodium intake to less than 2,300 mg per day.  Always check the nutrition information of foods labeled as "low-fat" or "nonfat". These foods may be higher in added sugar or refined carbs and should be avoided.  Talk to your dietitian to identify your daily goals for nutrients listed on the label. Shopping  Avoid buying canned, premade, or processed foods. These foods tend to be high in fat, sodium, and added sugar.  Shop around the outside edge of the grocery store. This includes fresh fruits and vegetables, bulk grains, fresh meats, and fresh dairy. Cooking  Use low-heat cooking methods, such as baking, instead of high-heat cooking methods like deep frying.  Cook using healthy oils, such as olive, canola, or sunflower oil.  Avoid cooking with butter, cream, or high-fat meats. Meal planning  Eat meals and snacks regularly, preferably at the same times every day. Avoid going long periods of time without eating.  Eat foods high in fiber, such as fresh fruits, vegetables, beans, and whole grains. Talk to your dietitian about how many servings of carbs you can eat at each meal.  Eat 4-6 ounces (oz) of lean protein each day, such as lean meat, chicken, fish, eggs, or tofu. One oz of lean protein is equal to: ? 1 oz of meat, chicken, or fish. ? 1 egg. ?  cup of tofu.  Eat some foods each day that contain healthy fats, such as avocado, nuts, seeds, and fish. Lifestyle  Check your blood glucose regularly.  Exercise regularly as told by your health care provider. This may include: ? 150 minutes of moderate-intensity or vigorous-intensity exercise each week. This could be brisk walking, biking, or water  aerobics. ? Stretching and doing strength exercises, such as yoga or weightlifting, at least 2 times a week.  Take medicines as told by your health care provider.  Do not use any products that contain nicotine or tobacco, such as cigarettes and e-cigarettes. If you need help quitting, ask your health care provider.  Work with a Social worker or diabetes educator to identify strategies to manage stress and any emotional and social challenges. Questions to ask a health care provider  Do I need to meet with a diabetes educator?  Do I need to meet with a dietitian?  What number can I call if I have questions?  When are the best times to check my blood glucose? Where to find more information:  American Diabetes Association: diabetes.org  Academy of Nutrition and Dietetics: www.eatright.[employer] of Diabetes and Digestive and Kidney Diseases (NIH): [employer] Summary  A healthy meal plan will help you control your blood glucose and maintain a healthy lifestyle.  Working with a diet and nutrition specialist (dietitian) can help you make a meal plan that is best for you.  Keep in mind that  carbohydrates (carbs) and alcohol have immediate effects on your blood glucose levels. It is important to count carbs and to use alcohol carefully. This information is not intended to replace advice given to you by your health care provider. Make sure you discuss any questions you have with your health care provider. Document Revised: 01/10/2017 Document Reviewed: 03/04/2016 Elsevier Patient Education  2020 [employer].

## 2020-01-04 NOTE — Assessment & Plan Note (Signed)
Liver enzymes have normalized

## 2020-01-04 NOTE — Progress Notes (Signed)
NO ISSUES, PT DESIRES TO CONTROL BS LEVELS

## 2020-01-04 NOTE — Assessment & Plan Note (Signed)
Continue losartan and amlodipine for now blood pressure under good control

## 2020-01-04 NOTE — Progress Notes (Signed)
Subjective:    Patient ID: Jason Bruce, male    DOB: 10-25-1975, 44 y.o.   MRN: [id number]  History of Present Illness:  10/27/2019 This is a pleasant African-American male who was admitted for Covid viral infection with associated pneumonia in August of this year.  Below is a copy of the discharge summary.  This is a telephone visit as the patient cannot achieve a video access on his phone   11/22/2019 This patient was seen prior by way of a phone visit on 15 September comes in the office for direct exam.  Overall he is improved.  He is having less shortness of breath.  His blood sugars have been in the 200 range.  Blood pressure is lower.  On arrival blood pressure is good at 127/93.  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  He does maintain the Metformin and Tradjenta.  He has no symptoms referable to the COVID-19 infection.  01/04/2020 Patient returns in follow-up for COVID-19 viral pneumonia and hypoxic respiratory failure which is now totally resolved.  Patient has morbid obesity with hypertension type 2 diabetes.  Acute kidney injury had also resolved at the last visit with follow-up labs liver enzymes have normalized as well  The patient on arrival today has a glucose of 295 hemoglobin A1c of 9.8 he had ran out of his Tradjenta over a month ago he is only taking his Metformin.  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  He is not very active.  He is not been compliant with the diet as well.  The patient has yet to obtain his Covid vaccine.  He has been encouraged to get this previously. No other complaints at this time  On arrival blood pressure is good at 124/80  Past Medical History:  Diagnosis Date  . Acute hypoxemic respiratory failure due to COVID-19 ([place]) 09/30/2019  . AKI (acute kidney injury) ([place])   . Hypertension   . Nephrolithiasis   . Pneumonia due to COVID-19 virus 09/30/2019     Family History  Problem Relation Age of  Onset  . Cancer Mother   . Hypertension Other   . Diabetes Other      Social History   Socioeconomic History  . Marital status: Single    Spouse name: Not on file  . Number of children: Not on file  . Years of education: Not on file  . Highest education level: Not on file  Occupational History  . Not on file  Tobacco Use  . Smoking status: Never Smoker  . Smokeless tobacco: Never Used  Vaping Use  . Vaping Use: Never used  Substance and Sexual Activity  . Alcohol use: No  . Drug use: No  . Sexual activity: Not on file  Other Topics Concern  . Not on file  Social History Narrative  . Not on file   Social Determinants of Health   Financial Resource Strain:   . Difficulty of Paying Living Expenses: Not on file  Food Insecurity:   . Worried About Charity fundraiser in the Last Year: Not on file  . Ran Out of Food in the Last Year: Not on file  Transportation Needs:   . Lack of Transportation (Medical): Not on file  . Lack of Transportation (Non-Medical): Not on file  Physical Activity:   . Days of Exercise per Week: Not on file  . Minutes of Exercise per Session: Not on  file  Stress:   . Feeling of Stress : Not on file  Social Connections:   . Frequency of Communication with Friends and Family: Not on file  . Frequency of Social Gatherings with Friends and Family: Not on file  . Attends Religious Services: Not on file  . Active Member of Clubs or Organizations: Not on file  . Attends Archivist Meetings: Not on file  . Marital Status: Not on file  Intimate Partner Violence:   . Fear of Current or Ex-Partner: Not on file  . Emotionally Abused: Not on file  . Physically Abused: Not on file  . Sexually Abused: Not on file     Allergies  Allergen Reactions  . Shellfish Allergy Anaphylaxis and Swelling     Outpatient Medications Prior to Visit  Medication Sig Dispense Refill  . Accu-Chek FastClix Lancets MISC Use to test blood sugar twice a day 100  each 1  . Blood Glucose Monitoring Suppl (ACCU-CHEK GUIDE) w/Device KIT Test blood sugar twice a day 1 kit 0  . glucose blood (ACCU-CHEK GUIDE) test strip Use as instructed 100 each 12  . Lancets Misc. (ACCU-CHEK FASTCLIX LANCET) KIT Use to test blood sugar twice a day 1 kit 0  . Multiple Vitamin (MULTIVITAMIN WITH MINERALS) TABS tablet Take 1 tablet by mouth daily.    Marland Kitchen amLODipine (NORVASC) 10 MG tablet Take 1 tablet (10 mg total) by mouth daily. 90 tablet 1  . atorvastatin (LIPITOR) 20 MG tablet Take 1 tablet (20 mg total) by mouth daily. 90 tablet 3  . linagliptin (TRADJENTA) 5 MG TABS tablet Take 1 tablet (5 mg total) by mouth daily. 30 tablet 1  . losartan (COZAAR) 100 MG tablet Take 1 tablet (100 mg total) by mouth daily. 90 tablet 3  . metFORMIN (GLUCOPHAGE) 500 MG tablet Take 1 tablet (500 mg total) by mouth 2 (two) times daily with a meal. 60 tablet 3  . Potassium Citrate 15 MEQ (1620 MG) TBCR Take 30 mEq by mouth 2 (two) times daily.     No facility-administered medications prior to visit.      Review of Systems  Constitutional: Negative for activity change and fatigue.  HENT: Negative for congestion and sore throat.   Eyes: Negative.   Respiratory: Negative for cough, shortness of breath and wheezing.   Cardiovascular: Negative.   Gastrointestinal: Negative.   Endocrine: Negative.   Genitourinary: Negative.   Musculoskeletal: Negative.   Allergic/Immunologic: Negative.   Neurological: Negative for dizziness.  Hematological: Negative.   Psychiatric/Behavioral: Negative for agitation, confusion, decreased concentration and sleep disturbance. The patient is not nervous/anxious and is not hyperactive.        Objective:   Physical Exam Vitals:   01/04/20 1039  BP: 124/82  Pulse: 97  SpO2: 98%  Weight: (!) 343 lb (155.6 kg)    Gen: Pleasant, obese  in no distress,  normal affect  ENT: No lesions,  mouth clear,  oropharynx clear, no postnasal drip  Neck: No JVD, no  TMG, no carotid bruits  Lungs: No use of accessory muscles, no dullness to percussion, clear without rales or rhonchi  Cardiovascular: RRR, heart sounds normal, no murmur or gallops, no peripheral edema  Abdomen: soft and NT, no HSM,  BS normal  Musculoskeletal: No deformities, no cyanosis or clubbing  Neuro: alert, non focal  Skin: Warm, no lesions or rashes  CXR 8/18: FINDINGS: Mildly decreased lung volumes are seen which is likely secondary to the degree of patient  inspiration. Mild linear atelectasis is seen within the right perihilar region, mid left lung and left lung base. Mild slightly patchy appearing left basilar atelectasis and/or infiltrate is also seen. There is no evidence of a pleural effusion or pneumothorax. The heart size and mediastinal contours are within normal limits. The visualized skeletal structures are unremarkable.  IMPRESSION: Mild slightly patchy appearing left basilar atelectasis and/or Infiltrate.  BMP Latest Ref Rng & Units 11/22/2019 10/03/2019 10/02/2019  Glucose 65 - 99 mg/dL 177(H) 178(H) 465(H)  BUN 6 - 24 mg/dL 17 27(H) 25(H)  Creatinine 0.76 - 1.27 mg/dL 0.89 1.34(H) 1.26(H)  BUN/Creat Ratio 9 - 20 19 - -  Sodium 134 - 144 mmol/L 139 136 133(L)  Potassium 3.5 - 5.2 mmol/L 4.3 3.6 4.3  Chloride 96 - 106 mmol/L 101 99 95(L)  CO2 20 - 29 mmol/L _0 Calcium 8.7 - 10.2 mg/dL 9.9 9.0 9.2   CBC Latest Ref Rng & Units 11/22/2019 10/03/2019 10/02/2019  WBC 3.4 - 10.8 x10E3/uL 7.8 10.1 10.2  Hemoglobin 13.0 - 17.7 g/dL 11.5(L) 11.8(L) 12.6(L)  Hematocrit 37.5 - 51.0 % 33.7(L) 37.9(L) 39.2  Platelets 150 - 450 x10E3/uL 250 440(H) 464(H)   Hepatic Function Latest Ref Rng & Units 11/22/2019 10/03/2019 10/02/2019  Total Protein 6.0 - 8.5 g/dL 7.6 7.0 7.9  Albumin 4.0 - 5.0 g/dL 4.6 3.3(L) 3.4(L)  AST 0 - 40 IU/L 17 53(H) 67(H)  ALT 0 - 44 IU/L 24 86(H) 72(H)  Alk Phosphatase 44 - 121 IU/L 72 50 56  Total Bilirubin 0.0 - 1.2 mg/dL <0.2 0.5  0.7           Assessment & Plan:  I personally reviewed all images and lab data in the [facility] system as well as any outside material available during this office visit and agree with the  radiology impressions.   DM (diabetes mellitus) ([place]) Type 2 diabetes poorly controlled A1c of 9.8  Will refill Tradjenta at 5 mg daily continue Metformin 500 mg twice daily  Patient to follow-up with our clinical pharmacist  AKI (acute kidney injury) ([place]) Renal function abnormalities has resolved  Elevated liver enzymes Liver enzymes have normalized  Hypertension Continue losartan and amlodipine for now blood pressure under good control   Jason Bruce was seen today for follow-up.  Diagnoses and all orders for this visit:  Type 2 diabetes mellitus without complication, without long-term current use of insulin ([facility]) -     POCT glucose (manual entry) -     POCT glycosylated hemoglobin (Hb A1C)  AKI (acute kidney injury) ([place])  Elevated liver enzymes  Primary hypertension  Other orders -     metFORMIN (GLUCOPHAGE) 500 MG tablet; Take 1 tablet (500 mg total) by mouth 2 (two) times daily with a meal. -     linagliptin (TRADJENTA) 5 MG TABS tablet; Take 1 tablet (5 mg total) by mouth daily. -     Potassium Citrate 15 MEQ (1620 MG) TBCR; Take 2 tablets by mouth 2 (two) times daily. -     atorvastatin (LIPITOR) 20 MG tablet; Take 1 tablet (20 mg total) by mouth daily. -     losartan (COZAAR) 100 MG tablet; Take 1 tablet (100 mg total) by mouth daily. -     amLODipine (NORVASC) 10 MG tablet; Take 1 tablet (10 mg total) by mouth daily.   Patient to continue atorvastatin 20 mg daily continue losartan and amlodipine for now Patient states he will seek a Covid vaccine  Foot  exam was normal  The patient is already received his Pneumovax flu vaccine and Tdap

## 2020-01-04 NOTE — Assessment & Plan Note (Signed)
Type 2 diabetes poorly controlled A1c of 9.8  Will refill Tradjenta at 5 mg daily continue Metformin 500 mg twice daily  Patient to follow-up with our clinical pharmacist

## 2020-01-05 ENCOUNTER — Telehealth: Payer: Self-pay | Admitting: Critical Care Medicine

## 2020-01-05 MED ORDER — SITAGLIPTIN PHOSPHATE 100 MG PO TABS: 100.0000 mg | ORAL_TABLET | Freq: Every day | ORAL | 2 refills | Status: DC

## 2020-01-05 NOTE — Telephone Encounter (Signed)
Copied from [place] [id number]. Topic: General - Other >> Jan 05, 2020 10:47 AM Rainey Pines A wrote: Patient is requesting callback today as soon as possible. Pateitn wasn't able to get medication trijenta and potassium medication he was prescribed yesterday due to insurance not covering and costs being too high. Patient would like alternatives called in and would like a callback today as soon as possible. Please advise

## 2020-01-05 NOTE — Telephone Encounter (Signed)
Rx sent for Januvia, which is covered by his insurance. Potassium will have to be changed by Dr. Joya Gaskins. Will route to him for review.

## 2020-01-07 MED ORDER — POTASSIUM CHLORIDE CRYS ER 20 MEQ PO TBCR: 20.0000 meq | EXTENDED_RELEASE_TABLET | Freq: Every day | ORAL | 3 refills | Status: DC

## 2020-01-18 ENCOUNTER — Ambulatory Visit: Payer: Managed Care, Other (non HMO) | Admitting: Pharmacist

## 2020-01-18 ENCOUNTER — Telehealth: Payer: Self-pay | Admitting: Critical Care Medicine

## 2020-01-18 NOTE — Telephone Encounter (Signed)
Copied from [place] [id number]. Topic: Quick Communication - See Telephone Encounter >> Jan 18, 2020  9:40 AM Loma [place] wrote: CRM for notification. See Telephone encounter for: 01/18/20. Pt wants appt today cancelled and resch, statess he has something else to do. FU at [phone]

## 2020-02-01 ENCOUNTER — Ambulatory Visit: Payer: Managed Care, Other (non HMO) | Attending: Critical Care Medicine | Admitting: Pharmacist

## 2020-02-01 ENCOUNTER — Other Ambulatory Visit: Payer: Self-pay

## 2020-02-01 ENCOUNTER — Encounter: Payer: Self-pay | Admitting: Pharmacist

## 2020-02-01 DIAGNOSIS — E119 Type 2 diabetes mellitus without complications: Secondary | ICD-10-CM

## 2020-02-01 LAB — GLUCOSE, POCT (MANUAL RESULT ENTRY): POC Glucose: 230 mg/dl — AB (ref 70–99)

## 2020-02-01 MED ORDER — TRULICITY 0.75 MG/0.5ML ~~LOC~~ SOAJ: 0.7500 mg | SUBCUTANEOUS | 0 refills | Status: DC

## 2020-02-01 MED ORDER — METFORMIN HCL 500 MG PO TABS: 1000.0000 mg | ORAL_TABLET | Freq: Two times a day (BID) | ORAL | 2 refills | Status: DC

## 2020-02-01 NOTE — Progress Notes (Signed)
S:     No chief complaint on file.  Patient arrives well and in good spirits.  Presents for diabetes evaluation, education, and management. Patient was referred and last seen by Primary Care Provider, Dr. Delford Field, on 01/04/2020. At this visit, DDP-4 inh was restarted as patient had been out for approximately one month.   Today, patient reports adherence to both medications and home blood sugar testing. Reports that his wife helps him daily with his pillbox and glucometer. He was not able to report any home readings, however, states his wife has said they are "in the higher percentage".   Family/Social History: non-smoker, family history of hypertension and diabetes  Insurance coverage/medication affordability: Cigna   Medication adherence reported to be appropriate. Current diabetes medications include: Januvia, metformin 500 mg BID (he reports he is only taking this once daily)  Current hypertension medications include: losartan 100 mg daily  Current hyperlipidemia medications include: atorvastatin 20 mg daily   Patient denies hypoglycemic events.  Patient reported dietary habits: Eats 3 meals/day Diet is not well controlled. Patient does admit to sugary sweets regularly. Eats a lot of meat.   Patient-reported exercise habits: none reported   Patient denies nocturia (nighttime urination).  Patient reports neuropathy (nerve pain). Patient denies visual changes. Patient reports self foot exams.     O:  Physical Exam   ROS  Fasting blood sugar in office today: 230  Lab Results  Component Value Date   HGBA1C 9.8 (A) 01/04/2020   There were no vitals filed for this visit.  Lipid Panel  No results found for: CHOL, TRIG, HDL, CHOLHDL, VLDL, LDLCALC, LDLDIRECT    Clinical Atherosclerotic Cardiovascular Disease (ASCVD): No  The 10-year ASCVD risk score Denman George DC Jr., et al., 2013) is: 13.3%   Values used to calculate the score:     Age: 44 years     Sex: Male     Is  Non-Hispanic African American: Yes     Diabetic: Yes     Tobacco smoker: No     Systolic Blood Pressure: 124 mmHg     Is BP treated: Yes     HDL Cholesterol: 49 MG/DL     Total Cholesterol: 244 MG/DL    A/P: Diabetes longstanding currently uncontrolled with recent A1c of 9.8 (goal <7). Patient is able to verbalize appropriate hypoglycemia management plan. Medication adherence appears appropriate. Control is suboptimal due to dietary indiscretion and physical inactivity.   Patient has tolerated metformin daily, will increase to desired twice daily schedule today. Addition of a GLP-1 could be a great option for glycemic and weight loss benefits. Patient is amenable to initiating an injectable today.   -Increased metformin 1000 mg to twice daily. -Started Trulicity 0.75 mg weekly. Patient identified Monday's will be injection day. Patient was properly educated on injection technique using demo pens. I also signed him up for a copay card to assist financially.  -Stopped Januvia  -Extensively discussed pathophysiology of diabetes, recommended lifestyle interventions, dietary effects on blood sugar control -Counseled on s/sx of and management of hypoglycemia -Next A1C anticipated February 2022.  ASCVD risk - primary prevention in patient with diabetes. Lipid panel being collected today. ASCVD risk score is not >20%  -moderate intensity statin indicated. Aspirin is not indicated.  -Continued atorvastatin 20 mg.    Written patient instructions provided.  Total time in face to face counseling 15 minutes.   Follow up Pharmacist Clinic Visit in 1 month.     Theodis Sato,  PharmD PGY-1 [facility] Pharmacy Resident  02/01/2020 2:01 PM

## 2020-02-02 ENCOUNTER — Telehealth: Payer: Self-pay

## 2020-02-02 LAB — MICROALBUMIN / CREATININE URINE RATIO
Creatinine, Urine: 106 mg/dL
Microalb/Creat Ratio: 38 mg/g creat — ABNORMAL HIGH (ref 0–29)
Microalbumin, Urine: 40.5 ug/mL

## 2020-02-02 LAB — LIPID PANEL
Chol/HDL Ratio: 4.3 ratio (ref 0.0–5.0)
Cholesterol, Total: 208 mg/dL — ABNORMAL HIGH (ref 100–199)
HDL: 48 mg/dL (ref >39)
LDL Chol Calc (NIH): 135 mg/dL — ABNORMAL HIGH (ref 0–99)
Triglycerides: 139 mg/dL (ref 0–149)
VLDL Cholesterol Cal: 25 mg/dL (ref 5–40)

## 2020-02-02 NOTE — Telephone Encounter (Signed)
PA FOR TRULICITY HAS BEEN APPROVED UNTIL 02/01/2023

## 2020-02-03 ENCOUNTER — Telehealth: Payer: Self-pay

## 2020-02-03 NOTE — Telephone Encounter (Signed)
-----   Message from Elsie Stain, MD sent at 02/02/2020  8:13 PM EST ----- Tell mr Mcnee his cholesterol is still elevated.  Pls increase atorvastatin to two tablets daily.   His protein in the urine is normal

## 2020-02-03 NOTE — Telephone Encounter (Signed)
Patient was called and a voicemail was left informing patient to return phone call for lab results. 

## 2020-02-12 DIAGNOSIS — Z03818 Encounter for observation for suspected exposure to other biological agents ruled out: Secondary | ICD-10-CM | POA: Diagnosis not present

## 2020-02-25 ENCOUNTER — Ambulatory Visit: Payer: Self-pay | Admitting: *Deleted

## 2020-02-25 NOTE — Telephone Encounter (Signed)
Spoke with clinical pharmacist Lurena Joiner and he will be reach out to the patient concerning take double doses of his medications.

## 2020-02-25 NOTE — Telephone Encounter (Addendum)
Pt's wife called in with him in back ground on speaker.   He accidentally took 2 doses of his medications this morning.   He is not having any symptoms, dizziness, weak, nausea, sweating, etc when I asked him.  He took 2 of everything 1st dose at 7:15 this morning.   2nd doses at 11:15 AM this morning.  Amlodipine 10 mg Atorvastatin 20 mg Metformin 500 mg Losartan 100 mg  I let him know if he started having any symptoms or not feeling right he needs to go to the ED.  He and his wife were agreeable to this plan.   I let them know someone would call them back.  I sent my notes high priority to [facility] and I called into the office and made them aware of the situation and that I had instructed him to call 911 if he started having any symptoms or just did not feel right or felt different.   Reason for Disposition . [1] DOUBLE DOSE (an extra dose or lesser amount) of prescription drug AND [2] NO symptoms (Exception: a double dose of antibiotics)  Answer Assessment - Initial Assessment Questions 1. NAME of MEDICATION: "What medicine are you calling about?"     Wife calling in.   Husband took too many of his medications. 2. QUESTION: "What is your question?" (e.g., medication refill, side effect)     Amlodipine 2 10 mg,   2 atorvastatin 20 mg  ;   2 metformin 500 mg and 2 losartan 100 mg this morning.  Husband took 2 of each of these this morning about 15 minutes ago (be 11:15).  I took the first doses at 7:30 AM and I forgot I'd taken it and took it again at 11:15 AM. 3. PRESCRIBING HCP: "Who prescribed it?" Reason: if prescribed by specialist, call should be referred to that group.     Dr. Joya Gaskins. 4. SYMPTOMS: "Do you have any symptoms?"     No symptoms.   5. SEVERITY: If symptoms are present, ask "Are they mild, moderate or severe?"     None 6. PREGNANCY:  "Is there any chance that you are pregnant?" "When was your last menstrual period?"     N/A  Protocols  used: MEDICATION QUESTION CALL-A-AH

## 2020-03-03 ENCOUNTER — Ambulatory Visit: Payer: Managed Care, Other (non HMO) | Admitting: Pharmacist

## 2020-03-06 ENCOUNTER — Other Ambulatory Visit: Payer: Self-pay | Admitting: Critical Care Medicine

## 2020-03-06 DIAGNOSIS — E119 Type 2 diabetes mellitus without complications: Secondary | ICD-10-CM

## 2020-03-10 ENCOUNTER — Encounter: Payer: Self-pay | Admitting: Pharmacist

## 2020-03-10 ENCOUNTER — Ambulatory Visit: Payer: BC Managed Care – PPO | Attending: Critical Care Medicine | Admitting: Pharmacist

## 2020-03-10 ENCOUNTER — Other Ambulatory Visit: Payer: Self-pay

## 2020-03-10 DIAGNOSIS — E119 Type 2 diabetes mellitus without complications: Secondary | ICD-10-CM | POA: Diagnosis not present

## 2020-03-10 LAB — GLUCOSE, POCT (MANUAL RESULT ENTRY): POC Glucose: 291 mg/dl — AB (ref 70–99)

## 2020-03-10 MED ORDER — ATORVASTATIN CALCIUM 40 MG PO TABS: 40.0000 mg | ORAL_TABLET | Freq: Every day | ORAL | 3 refills | Status: DC

## 2020-03-10 MED ORDER — TRULICITY 1.5 MG/0.5ML ~~LOC~~ SOAJ: 1.5000 mg | SUBCUTANEOUS | 2 refills | Status: DC

## 2020-03-10 NOTE — Patient Instructions (Addendum)
Thank you for coming to see me today. Please do the following:  1. Continue taking Trulicity once weekly. Please pick up the increased dose from your pharmacy. 2. Start taking 2 tablets of metformin after breakfast and 2 tablets after dinner. 3. Continue taking atorvastatin once daily. Please pick up the increased dose from your pharmacy. 4. Continue checking blood sugars at home. It's really important that you record these and bring these in to your next doctor's appointment. If you get in readings above 500 or lower than 70, call me or the clinic to let your doctor know. See below on how to treat low blood sugar.  5. Continue making the lifestyle changes we've discussed together during our visit. Diet and exercise play a significant role in improving your blood sugars.  6. Follow-up with PCP in 1 month.   Hypoglycemia or low blood sugar:   Low blood sugar can happen quickly and may become an emergency if not treated right away.   While this shouldn't happen often, it can be brought upon if you skip a meal or do not eat enough. Also, if your insulin or other diabetes medications are dosed too high, this can cause your blood sugar to go to low.   Warning signs of low blood sugar include: 1. Feeling shaky or dizzy 2. Feeling weak or tired  3. Excessive hunger 4. Feeling anxious or upset  5. Sweating even when you aren't exercising  What to do if I experience low blood sugar? 1. Check your blood sugar with your meter. If lower than 70, proceed to step 2.  2. Treat with 3-4 glucose tablets or 3 packets of regular sugar. If these aren't around, you can try hard candy. Yet another option would be to drink 4 ounces of fruit juice or 6 ounces of REGULAR soda.  3. Re-check your sugar in 15 minutes. If it is still below 70, do what you did in step 2 again. If has come back up, go ahead and eat a snack or small meal at this time.

## 2020-03-10 NOTE — Progress Notes (Signed)
S:     No chief complaint on file.  Patient arrives well and in good spirits.  Presents for diabetes evaluation, education, and management. Patient was referred and last seen by Primary Care Provider, Dr. Joya Gaskins, on 01/04/2020.We saw him last on 02/01/2020. We increased his metformin to 1000 mg BID and started Trulicity. Januvia was stopped. We got a lipid panel on him, and Dr. Joya Gaskins increased his atorvastatin d/t above goal LDL.   Pt reports compliance with medications, however, he did not increase his metformin to 1000 mg BID. He denies hypoglycemia. He reports no GI sx with Trulicity or metformin. He did not bring his glucometer today and reports that wife takes his BG at home. Pt reports BG usually <150.  Family/Social History: non-smoker, family history of hypertension and diabetes  Insurance coverage/medication affordability: Cigna   Medication adherence reported. Current diabetes medications include: metformin 606 mg BID, Trulicity 3.01 mg weekly  Current hypertension medications include: losartan 100 mg daily  Current hyperlipidemia medications include: atorvastatin 40 mg daily (currently taking two tabs - 40 mg daily)  Patient denies hypoglycemic events.  Patient reported dietary habits: Eats 3 meals/day Pt reports that his wife is only buying "good foods and threw out all the junk foods". Morning star foods.    Patient-reported exercise habits: starting to exercise on weekends with home machine   Patient reports nocturia (nighttime urination).  Patient denies neuropathy (nerve pain). Patient denies visual changes. Patient reports self foot exams.     O:  POCT: 291 pt admits to drinking a soda this AM and sandwich for lunch  Lab Results  Component Value Date   HGBA1C 9.8 (A) 01/04/2020   There were no vitals filed for this visit.  Lipid Panel     Component Value Date/Time   CHOL 208 (H) 02/01/2020 1136   TRIG 139 02/01/2020 1136   HDL 48 02/01/2020 1136    CHOLHDL 4.3 02/01/2020 1136   LDLCALC 135 (H) 02/01/2020 1136    Clinical Atherosclerotic Cardiovascular Disease (ASCVD): No  The 10-year ASCVD risk score Mikey Bussing DC Jr., et al., 2013) is: 12.8%   Values used to calculate the score:     Age: 45 years     Sex: Male     Is Non-Hispanic African American: Yes     Diabetic: Yes     Tobacco smoker: No     Systolic Blood Pressure: 601 mmHg     Is BP treated: Yes     HDL Cholesterol: 48 mg/dL     Total Cholesterol: 208 mg/dL    A/P: Diabetes longstanding currently uncontrolled with recent A1c of 9.8 (goal <7). Patient is able to verbalize appropriate hypoglycemia management plan. Medication adherence appears appropriate. Control is suboptimal due to dietary indiscretion and physical inactivity.  -Increased metformin 1000 mg to twice daily -Increase to Trulicity 1.5 mg weekly.  -Extensively discussed pathophysiology of diabetes, recommended lifestyle interventions, dietary effects on blood sugar control -Counseled on s/sx of and management of hypoglycemia -Next A1C anticipated February 2022.  ASCVD risk - primary prevention in patient with diabetes. Lipid panel being collected today. ASCVD risk score is not >20%  -moderate intensity statin indicated. Aspirin is not indicated.  -Increased to atorvastatin 40 mg.    Written patient instructions provided.  Total time in face to face counseling 15 minutes.   Follow up PCP Clinic Visit in March.     Benard Halsted, PharmD, BCACP, [place]  [phone]   

## 2020-03-14 DIAGNOSIS — R197 Diarrhea, unspecified: Secondary | ICD-10-CM | POA: Diagnosis not present

## 2020-03-14 DIAGNOSIS — R1032 Left lower quadrant pain: Secondary | ICD-10-CM | POA: Diagnosis not present

## 2020-03-14 DIAGNOSIS — Z20822 Contact with and (suspected) exposure to covid-19: Secondary | ICD-10-CM | POA: Diagnosis not present

## 2020-03-14 DIAGNOSIS — R109 Unspecified abdominal pain: Secondary | ICD-10-CM | POA: Diagnosis not present

## 2020-03-14 DIAGNOSIS — K760 Fatty (change of) liver, not elsewhere classified: Secondary | ICD-10-CM | POA: Diagnosis not present

## 2020-03-14 DIAGNOSIS — M7918 Myalgia, other site: Secondary | ICD-10-CM | POA: Diagnosis not present

## 2020-03-14 DIAGNOSIS — Z87442 Personal history of urinary calculi: Secondary | ICD-10-CM | POA: Diagnosis not present

## 2020-03-27 ENCOUNTER — Other Ambulatory Visit: Payer: Self-pay | Admitting: Critical Care Medicine

## 2020-03-27 DIAGNOSIS — E119 Type 2 diabetes mellitus without complications: Secondary | ICD-10-CM

## 2020-04-07 ENCOUNTER — Telehealth: Payer: Self-pay | Admitting: Critical Care Medicine

## 2020-04-07 NOTE — Telephone Encounter (Signed)
Copied from [place] [id number]. Topic: Quick Communication - Rx Refill/Question >> Apr 07, 2020 10:42 AM Erick Blinks wrote: Seeking a more affordable option for Potassium Citrate 15 MEQ (1620 MG) TBCR his insurance does not cover this. This has been discontinued on his chart, he wants to know if he needs to continue this because [facility] afford [facility] at [facility].   [id number] [address], [place] - [address] [place] [address] Phone: [phone] Fax: [phone]  Best contact: [phone]

## 2020-04-07 NOTE — Telephone Encounter (Signed)
Correct, the 15 mEq citrate rx is no longer active. Pt has 20 mEq potassium chloride listed on his profile.

## 2020-04-10 NOTE — Telephone Encounter (Signed)
Patient advised that the Potassium citrate 15 mEq was d/c'ed.  Informed that the Potassium 71mEq was on his medication list. He should f/u at his appt on 04/24/2020 with PCP for possible lab work.

## 2020-04-23 NOTE — Progress Notes (Signed)
Subjective:    Patient ID: Jason Bruce, male    DOB: 28-Feb-1975, 45 y.o.   MRN: [id number] Virtual Visit via Telephone Note  I connected with [name] on 04/24/20 at  2:00 PM EDT by telephone and verified that I am speaking with the correct person using two identifiers.   Consent:  I discussed the limitations, risks, security and privacy concerns of performing an evaluation and management service by telephone and the availability of in person appointments. I also discussed with the patient that there may be a patient responsible charge related to this service. The patient expressed understanding and agreed to proceed.  Location of patient: Patient's at home  Location of provider: I am in my office at home  Persons participating in the televisit with the patient.   No one else on the call   History of Present Illness:  10/27/2019 This is a pleasant African-American male who was admitted for Covid viral infection with associated pneumonia in August of this year.  Below is a copy of the discharge summary.  This is a telephone visit as the patient cannot achieve a video access on his phone   11/22/2019 This patient was seen prior by way of a phone visit on 15 September comes in the office for direct exam.  Overall he is improved.  He is having less shortness of breath.  His blood sugars have been in the 200 range.  Blood pressure is lower.  On arrival blood pressure is good at 127/93.  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  He does maintain the Metformin and Tradjenta.  He has no symptoms referable to the COVID-19 infection.  01/04/2020 Patient returns in follow-up for COVID-19 viral pneumonia and hypoxic respiratory failure which is now totally resolved.  Patient has morbid obesity with hypertension type 2 diabetes.  Acute kidney injury had also resolved at the last visit with follow-up labs liver enzymes have normalized as well  The  patient on arrival today has a glucose of 295 hemoglobin A1c of 9.8 he had ran out of his Tradjenta over a month ago he is only taking his Metformin.  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  He is not very active.  He is not been compliant with the diet as well.  The patient has yet to obtain his Covid vaccine.  He has been encouraged to get this previously. No other complaints at this time  On arrival blood pressure is good at 124/80  04/24/20 This patient is seen by way of a telephone visit.  His phone did not except video.  This is a diabetic follow-up for this patient.  Prior history of severe Covid viral pneumonia in 2021.  He has resolved from the pneumonia.  He states his blood pressure when being measured at home is in the 120/80 range.  His blood glucoses have dropped to below 100s and he has lost another 23 pounds of weight.  He is taking Klor-Con potassium supplementation needs to have potassium levels rechecked.  His original A1c was 9.8 when he was admitted for viral pneumonia. Patient is yet to receive a COVID vaccine at this time.  Patient is compliant with his atorvastatin and he is maintaining Trulicity with an increased dose of 1.5 mg injected weekly.  Patient maintains the Metformin 1000 mg twice daily.  The patient also maintains losartan 100 mg amlodipine 10 mg for blood pressure.  There  are no other complaints at this time.  Note patient is uninsured at this time.  He does not wish to pursue a colonoscopy.  Past Medical History:  Diagnosis Date  . Acute hypoxemic respiratory failure due to COVID-19 ([place]) 09/30/2019  . AKI (acute kidney injury) ([place])   . Hypertension   . Nephrolithiasis   . Pneumonia due to COVID-19 virus 09/30/2019     Family History  Problem Relation Age of Onset  . Cancer Mother   . Hypertension Other   . Diabetes Other      Social History   Socioeconomic History  . Marital status: Single    Spouse name: Not on file  . Number of  children: Not on file  . Years of education: Not on file  . Highest education level: Not on file  Occupational History  . Not on file  Tobacco Use  . Smoking status: Never Smoker  . Smokeless tobacco: Never Used  Vaping Use  . Vaping Use: Never used  Substance and Sexual Activity  . Alcohol use: No  . Drug use: No  . Sexual activity: Not on file  Other Topics Concern  . Not on file  Social History Narrative  . Not on file   Social Determinants of Health   Financial Resource Strain: Not on file  Food Insecurity: Not on file  Transportation Needs: Not on file  Physical Activity: Not on file  Stress: Not on file  Social Connections: Not on file  Intimate Partner Violence: Not on file     Allergies  Allergen Reactions  . Shellfish Allergy Anaphylaxis and Swelling     Outpatient Medications Prior to Visit  Medication Sig Dispense Refill  . atorvastatin (LIPITOR) 40 MG tablet Take 1 tablet (40 mg total) by mouth daily. 90 tablet 3  . Blood Glucose Monitoring Suppl (ACCU-CHEK GUIDE) w/Device KIT Test blood sugar twice a day 1 kit 0  . Lancets Misc. (ACCU-CHEK FASTCLIX LANCET) KIT Use to test blood sugar twice a day 1 kit 0  . Multiple Vitamin (MULTIVITAMIN WITH MINERALS) TABS tablet Take 1 tablet by mouth daily.    . Accu-Chek FastClix Lancets MISC Use to test blood sugar twice a day 100 each 1  . amLODipine (NORVASC) 10 MG tablet Take 1 tablet (10 mg total) by mouth daily. 90 tablet 1  . Dulaglutide (TRULICITY) 1.5 SV/7.7LT SOPN Inject 1.5 mg into the skin once a week. 2 mL 2  . glucose blood (ACCU-CHEK GUIDE) test strip Use as instructed 100 each 12  . losartan (COZAAR) 100 MG tablet Take 1 tablet (100 mg total) by mouth daily. 90 tablet 3  . metFORMIN (GLUCOPHAGE) 500 MG tablet Take 2 tablets (1,000 mg total) by mouth 2 (two) times daily with a meal. 120 tablet 2  . potassium chloride SA (KLOR-CON) 20 MEQ tablet Take 1 tablet (20 mEq total) by mouth daily. 30 tablet 3    No facility-administered medications prior to visit.      Review of Systems  Constitutional: Negative for activity change and fatigue.  HENT: Negative for congestion and sore throat.   Eyes: Negative.   Respiratory: Negative for cough, shortness of breath and wheezing.   Cardiovascular: Negative.   Gastrointestinal: Negative.   Endocrine: Negative.   Genitourinary: Negative.   Musculoskeletal: Negative.   Allergic/Immunologic: Negative.   Neurological: Negative for dizziness.  Hematological: Negative.   Psychiatric/Behavioral: Negative for agitation, confusion, decreased concentration and sleep disturbance. The patient is not nervous/anxious and is  not hyperactive.        Objective:   Physical Exam Vitals:   04/24/20 1543  BP: 120/80    No exam this is a phone note  CXR 8/18: FINDINGS: Mildly decreased lung volumes are seen which is likely secondary to the degree of patient inspiration. Mild linear atelectasis is seen within the right perihilar region, mid left lung and left lung base. Mild slightly patchy appearing left basilar atelectasis and/or infiltrate is also seen. There is no evidence of a pleural effusion or pneumothorax. The heart size and mediastinal contours are within normal limits. The visualized skeletal structures are unremarkable.  IMPRESSION: Mild slightly patchy appearing left basilar atelectasis and/or Infiltrate.  BMP Latest Ref Rng & Units 11/22/2019 10/03/2019 10/02/2019  Glucose 65 - 99 mg/dL 177(H) 178(H) 465(H)  BUN 6 - 24 mg/dL 17 27(H) 25(H)  Creatinine 0.76 - 1.27 mg/dL 0.89 1.34(H) 1.26(H)  BUN/Creat Ratio 9 - 20 19 - -  Sodium 134 - 144 mmol/L 139 136 133(L)  Potassium 3.5 - 5.2 mmol/L 4.3 3.6 4.3  Chloride 96 - 106 mmol/L 101 99 95(L)  CO2 20 - 29 mmol/L '24 30 25  ' Calcium 8.7 - 10.2 mg/dL 9.9 9.0 9.2   CBC Latest Ref Rng & Units 11/22/2019 10/03/2019 10/02/2019  WBC 3.4 - 10.8 x10E3/uL 7.8 10.1 10.2  Hemoglobin 13.0 - 17.7  g/dL 11.5(L) 11.8(L) 12.6(L)  Hematocrit 37.5 - 51.0 % 33.7(L) 37.9(L) 39.2  Platelets 150 - 450 x10E3/uL 250 440(H) 464(H)   Hepatic Function Latest Ref Rng & Units 11/22/2019 10/03/2019 10/02/2019  Total Protein 6.0 - 8.5 g/dL 7.6 7.0 7.9  Albumin 4.0 - 5.0 g/dL 4.6 3.3(L) 3.4(L)  AST 0 - 40 IU/L 17 53(H) 67(H)  ALT 0 - 44 IU/L 24 86(H) 72(H)  Alk Phosphatase 44 - 121 IU/L 72 50 56  Total Bilirubin 0.0 - 1.2 mg/dL <0.2 0.5 0.7           Assessment & Plan:  I personally reviewed all images and lab data in the [facility] system as well as any outside material available during this office visit and agree with the  radiology impressions.   Hypertension Hypertension well controlled according to the patient last reading 120/80 range  Continue amlodipine and losartan  DM (diabetes mellitus) ([place]) Type 2 diabetes well controlled at this time  Continue Trulicity and Metformin and atorvastatin  Colon cancer screening Referral made for colonoscopy   Diagnoses and all orders for this visit:  Type 2 diabetes mellitus without complication, without long-term current use of insulin (HCC) -     metFORMIN (GLUCOPHAGE) 500 MG tablet; Take 2 tablets (1,000 mg total) by mouth 2 (two) times daily with a meal.  Colon cancer screening -     Ambulatory referral to Gastroenterology  Primary hypertension  Other orders -     Accu-Chek FastClix Lancets MISC; Use to test blood sugar twice a day -     amLODipine (NORVASC) 10 MG tablet; Take 1 tablet (10 mg total) by mouth daily. -     Dulaglutide (TRULICITY) 1.5 UR/4.2HC SOPN; Inject 1.5 mg into the skin once a week. -     glucose blood (ACCU-CHEK GUIDE) test strip; Use as instructed -     losartan (COZAAR) 100 MG tablet; Take 1 tablet (100 mg total) by mouth daily. -     potassium chloride SA (KLOR-CON) 20 MEQ tablet; Take 1 tablet (20 mEq total) by mouth daily.    Follow Up Instructions: Patient  knows a face-to-face exam will occur within the  next month refills on all his medications and a colonoscopy will be referred   I discussed the assessment and treatment plan with the patient. The patient was provided an opportunity to ask questions and all were answered. The patient agreed with the plan and demonstrated an understanding of the instructions.   The patient was advised to call back or seek an in-person evaluation if the symptoms worsen or if the condition fails to improve as anticipated.  I provided 30 minutes of non-face-to-face time during this encounter  including  median intraservice time , review of notes, labs, imaging, medications  and explaining diagnosis and management to the patient .    Asencion Noble, MD

## 2020-04-24 ENCOUNTER — Other Ambulatory Visit: Payer: Self-pay

## 2020-04-24 ENCOUNTER — Encounter: Payer: Self-pay | Admitting: Critical Care Medicine

## 2020-04-24 ENCOUNTER — Ambulatory Visit: Payer: BC Managed Care – PPO | Attending: Critical Care Medicine | Admitting: Critical Care Medicine

## 2020-04-24 VITALS — BP 120/80

## 2020-04-24 DIAGNOSIS — E119 Type 2 diabetes mellitus without complications: Secondary | ICD-10-CM | POA: Diagnosis not present

## 2020-04-24 DIAGNOSIS — I10 Essential (primary) hypertension: Secondary | ICD-10-CM

## 2020-04-24 DIAGNOSIS — Z1211 Encounter for screening for malignant neoplasm of colon: Secondary | ICD-10-CM

## 2020-04-24 DIAGNOSIS — K635 Polyp of colon: Secondary | ICD-10-CM | POA: Insufficient documentation

## 2020-04-24 MED ORDER — TRULICITY 1.5 MG/0.5ML ~~LOC~~ SOAJ: 1.5000 mg | SUBCUTANEOUS | 2 refills | Status: DC

## 2020-04-24 MED ORDER — LOSARTAN POTASSIUM 100 MG PO TABS: 100.0000 mg | ORAL_TABLET | Freq: Every day | ORAL | 3 refills | Status: DC

## 2020-04-24 MED ORDER — AMLODIPINE BESYLATE 10 MG PO TABS: 10.0000 mg | ORAL_TABLET | Freq: Every day | ORAL | 1 refills | Status: DC

## 2020-04-24 MED ORDER — POTASSIUM CHLORIDE CRYS ER 20 MEQ PO TBCR: 20.0000 meq | EXTENDED_RELEASE_TABLET | Freq: Every day | ORAL | 3 refills | Status: DC

## 2020-04-24 MED ORDER — ACCU-CHEK GUIDE VI STRP: ORAL_STRIP | 12 refills | Status: DC

## 2020-04-24 MED ORDER — ACCU-CHEK FASTCLIX LANCETS MISC: 1 refills | Status: DC

## 2020-04-24 MED ORDER — METFORMIN HCL 500 MG PO TABS: 1000.0000 mg | ORAL_TABLET | Freq: Two times a day (BID) | ORAL | 2 refills | Status: DC

## 2020-04-24 NOTE — Assessment & Plan Note (Signed)
Type 2 diabetes well controlled at this time  Continue Trulicity and Metformin and atorvastatin

## 2020-04-24 NOTE — Assessment & Plan Note (Signed)
Referral made for colonoscopy

## 2020-04-24 NOTE — Assessment & Plan Note (Signed)
Hypertension well controlled according to the patient last reading 120/80 range  Continue amlodipine and losartan

## 2020-05-17 ENCOUNTER — Other Ambulatory Visit: Payer: Self-pay | Admitting: Critical Care Medicine

## 2020-06-12 NOTE — Progress Notes (Signed)
Subjective:    Patient ID: Jason Bruce, male    DOB: 10/19/1975, 45 y.o.   MRN: [id number] History of Present Illness:  10/27/2019 This is a pleasant African-American male who was admitted for Covid viral infection with associated pneumonia in August of this year.  Below is a copy of the discharge summary.  This is a telephone visit as the patient cannot achieve a video access on his phone   11/22/2019 This patient was seen prior by way of a phone visit on 15 September comes in the office for direct exam.  Overall he is improved.  He is having less shortness of breath.  His blood sugars have been in the 200 range.  Blood pressure is lower.  On arrival blood pressure is good at 127/93.  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  He does maintain the Metformin and Tradjenta.  He has no symptoms referable to the COVID-19 infection.  01/04/2020 Patient returns in follow-up for COVID-19 viral pneumonia and hypoxic respiratory failure which is now totally resolved.  Patient has morbid obesity with hypertension type 2 diabetes.  Acute kidney injury had also resolved at the last visit with follow-up labs liver enzymes have normalized as well  The patient on arrival today has a glucose of 295 hemoglobin A1c of 9.8 he had ran out of his Tradjenta over a month ago he is only taking his Metformin.  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  He is not very active.  He is not been compliant with the diet as well.  The patient has yet to obtain his Covid vaccine.  He has been encouraged to get this previously. No other complaints at this time  On arrival blood pressure is good at 124/80  04/24/20 This patient is seen by way of a telephone visit.  His phone did not except video.  This is a diabetic follow-up for this patient.  Prior history of severe Covid viral pneumonia in 2021.  He has resolved from the pneumonia.  He states his blood pressure when being measured  at home is in the 120/80 range.  His blood glucoses have dropped to below 100s and he has lost another 23 pounds of weight.  He is taking Klor-Con potassium supplementation needs to have potassium levels rechecked.  His original A1c was 9.8 when he was admitted for viral pneumonia. Patient is yet to receive a COVID vaccine at this time.  Patient is compliant with his atorvastatin and he is maintaining Trulicity with an increased dose of 1.5 mg injected weekly.  Patient maintains the Metformin 1000 mg twice daily.  The patient also maintains losartan 100 mg amlodipine 10 mg for blood pressure.  There are no other complaints at this time.  Note patient is uninsured at this time.  He does not wish to pursue a colonoscopy.  06/13/20 Patient seen return follow-up on arrival blood pressure 137/88 blood sugar 182 A1c 8.6  The patient has not had much weight loss he is not really exercising regularly does not follow a regular diabetic diet.  The patient is maintaining Trulicity and metformin.  The patient states his blood sugars range in the 1 80-200 range at home.  The patient is compliant with amlodipine and losartan for blood pressure.  The patient has yet to hear from gastroenterology regarding a colon cancer screen.  Patient has no other complaints at this time.  He is determined he will  not obtain the COVID-vaccine and knows the risk involved with this decision.  He previously had COVID-pneumonia a year ago and was at high risk for recurrence.   Past Medical History:  Diagnosis Date  . Acute hypoxemic respiratory failure due to COVID-19 ([place]) 09/30/2019  . AKI (acute kidney injury) ([place])   . Hypertension   . Nephrolithiasis   . Pneumonia due to COVID-19 virus 09/30/2019     Family History  Problem Relation Age of Onset  . Cancer Mother   . Hypertension Other   . Diabetes Other      Social History   Socioeconomic History  . Marital status: Single    Spouse name: Not on file  .  Number of children: Not on file  . Years of education: Not on file  . Highest education level: Not on file  Occupational History  . Not on file  Tobacco Use  . Smoking status: Never Smoker  . Smokeless tobacco: Never Used  Vaping Use  . Vaping Use: Never used  Substance and Sexual Activity  . Alcohol use: No  . Drug use: No  . Sexual activity: Not on file  Other Topics Concern  . Not on file  Social History Narrative  . Not on file   Social Determinants of Health   Financial Resource Strain: Not on file  Food Insecurity: Not on file  Transportation Needs: Not on file  Physical Activity: Not on file  Stress: Not on file  Social Connections: Not on file  Intimate Partner Violence: Not on file     Allergies  Allergen Reactions  . Shellfish Allergy Anaphylaxis and Swelling     Outpatient Medications Prior to Visit  Medication Sig Dispense Refill  . Accu-Chek FastClix Lancets MISC Use to test blood sugar twice a day 100 each 1  . Blood Glucose Monitoring Suppl (ACCU-CHEK GUIDE) w/Device KIT Test blood sugar twice a day 1 kit 0  . glucose blood (ACCU-CHEK GUIDE) test strip Use as instructed 100 each 12  . Lancets Misc. (ACCU-CHEK FASTCLIX LANCET) KIT Use to test blood sugar twice a day 1 kit 0  . Multiple Vitamin (MULTIVITAMIN WITH MINERALS) TABS tablet Take 1 tablet by mouth daily.    Marland Kitchen amLODipine (NORVASC) 10 MG tablet Take 1 tablet (10 mg total) by mouth daily. 90 tablet 1  . atorvastatin (LIPITOR) 40 MG tablet Take 1 tablet (40 mg total) by mouth daily. 90 tablet 3  . Dulaglutide (TRULICITY) 1.5 TK/2.4OX SOPN Inject 1.5 mg into the skin once a week. 4 mL 2  . losartan (COZAAR) 100 MG tablet Take 1 tablet (100 mg total) by mouth daily. 90 tablet 3  . metFORMIN (GLUCOPHAGE) 500 MG tablet Take 2 tablets (1,000 mg total) by mouth 2 (two) times daily with a meal. 180 tablet 2  . potassium chloride SA (KLOR-CON) 20 MEQ tablet Take 1 tablet (20 mEq total) by mouth daily. 30  tablet 3   No facility-administered medications prior to visit.      Review of Systems  Constitutional: Negative for activity change and fatigue.  HENT: Negative for congestion and sore throat.   Eyes: Negative.   Respiratory: Negative for cough, shortness of breath and wheezing.   Cardiovascular: Negative.   Gastrointestinal: Negative.   Endocrine: Negative.   Genitourinary: Negative.   Musculoskeletal: Negative.   Allergic/Immunologic: Negative.   Neurological: Negative for dizziness.  Hematological: Negative.   Psychiatric/Behavioral: Negative for agitation, confusion, decreased concentration and sleep disturbance. The patient is  not nervous/anxious and is not hyperactive.        Objective:   Physical Exam Vitals:   06/13/20 0904  BP: 137/88  Pulse: 96  Resp: 20  SpO2: 95%  Weight: (!) 340 lb 12.8 oz (154.6 kg)  Height: '5\' 10"'  (1.778 m)     Gen: Pleasant, obese, in no distress,  normal affect  ENT: No lesions,  mouth clear,  oropharynx clear, no postnasal drip  Neck: No JVD, no TMG, no carotid bruits  Lungs: No use of accessory muscles, no dullness to percussion, clear without rales or rhonchi  Cardiovascular: RRR, heart sounds normal, no murmur or gallops, no peripheral edema  Abdomen: soft and NT, no HSM,  BS normal  Musculoskeletal: No deformities, no cyanosis or clubbing  Neuro: alert, non focal  Skin: Warm, no lesions or rashes     CXR 8/18: FINDINGS: Mildly decreased lung volumes are seen which is likely secondary to the degree of patient inspiration. Mild linear atelectasis is seen within the right perihilar region, mid left lung and left lung base. Mild slightly patchy appearing left basilar atelectasis and/or infiltrate is also seen. There is no evidence of a pleural effusion or pneumothorax. The heart size and mediastinal contours are within normal limits. The visualized skeletal structures are unremarkable.  IMPRESSION: Mild slightly  patchy appearing left basilar atelectasis and/or Infiltrate.  BMP Latest Ref Rng & Units 11/22/2019 10/03/2019 10/02/2019  Glucose 65 - 99 mg/dL 177(H) 178(H) 465(H)  BUN 6 - 24 mg/dL 17 27(H) 25(H)  Creatinine 0.76 - 1.27 mg/dL 0.89 1.34(H) 1.26(H)  BUN/Creat Ratio 9 - 20 19 - -  Sodium 134 - 144 mmol/L 139 136 133(L)  Potassium 3.5 - 5.2 mmol/L 4.3 3.6 4.3  Chloride 96 - 106 mmol/L 101 99 95(L)  CO2 20 - 29 mmol/L '24 30 25  ' Calcium 8.7 - 10.2 mg/dL 9.9 9.0 9.2   CBC Latest Ref Rng & Units 11/22/2019 10/03/2019 10/02/2019  WBC 3.4 - 10.8 x10E3/uL 7.8 10.1 10.2  Hemoglobin 13.0 - 17.7 g/dL 11.5(L) 11.8(L) 12.6(L)  Hematocrit 37.5 - 51.0 % 33.7(L) 37.9(L) 39.2  Platelets 150 - 450 x10E3/uL 250 440(H) 464(H)   Hepatic Function Latest Ref Rng & Units 11/22/2019 10/03/2019 10/02/2019  Total Protein 6.0 - 8.5 g/dL 7.6 7.0 7.9  Albumin 4.0 - 5.0 g/dL 4.6 3.3(L) 3.4(L)  AST 0 - 40 IU/L 17 53(H) 67(H)  ALT 0 - 44 IU/L 24 86(H) 72(H)  Alk Phosphatase 44 - 121 IU/L 72 50 56  Total Bilirubin 0.0 - 1.2 mg/dL <0.2 0.5 0.7           Assessment & Plan:  I personally reviewed all images and lab data in the [facility] system as well as any outside material available during this office visit and agree with the  radiology impressions.   Hypertension Blood pressure under good control continue losartan and amlodipine follow-up metabolic panel blood count  DM (diabetes mellitus) ([place]) Type 2 diabetes not yet at goal A1c 8.6 will begin Jardiance 10 mg daily continue Trulicity and metformin I have advised the patient continue an exercise program and read educated the patient as to proper diet  Patient to return to see our clinical pharmacist in 2 weeks and see Dr. Joya Gaskins again in 2 months  Colon cancer screening Patient is yet to receive a call from gastroenterology we will send another referral over  BMI 45.0-49.9, adult [facility]) Patient given diet and exercise education   Preciliano was seen today  for  diabetes.  Diagnoses and all orders for this visit:  Type 2 diabetes mellitus without complication, without long-term current use of insulin (HCC) -     POCT glucose (manual entry) -     POCT glycosylated hemoglobin (Hb A1C) -     metFORMIN (GLUCOPHAGE) 500 MG tablet; Take 2 tablets (1,000 mg total) by mouth 2 (two) times daily with a meal. -     Comprehensive metabolic panel  Colon cancer screening -     Ambulatory referral to Gastroenterology  Primary hypertension -     CBC with Differential/Platelet  BMI 45.0-49.9, adult ([place])  Other orders -     amLODipine (NORVASC) 10 MG tablet; Take 1 tablet (10 mg total) by mouth daily. -     atorvastatin (LIPITOR) 40 MG tablet; Take 1 tablet (40 mg total) by mouth daily. -     Dulaglutide (TRULICITY) 1.5 MG/5.0IB SOPN; Inject 1.5 mg into the skin once a week. -     losartan (COZAAR) 100 MG tablet; Take 1 tablet (100 mg total) by mouth daily. -     potassium chloride SA (KLOR-CON) 20 MEQ tablet; Take 1 tablet (20 mEq total) by mouth daily. -     empagliflozin (JARDIANCE) 10 MG TABS tablet; Take 1 tablet (10 mg total) by mouth daily before breakfast.   I spent 34 minutes on this patient including chart review formulating complex medical decision making medication adjustments for high risk conditions

## 2020-06-13 ENCOUNTER — Encounter: Payer: Self-pay | Admitting: Critical Care Medicine

## 2020-06-13 ENCOUNTER — Ambulatory Visit: Payer: BC Managed Care – PPO | Attending: Critical Care Medicine | Admitting: Critical Care Medicine

## 2020-06-13 ENCOUNTER — Other Ambulatory Visit: Payer: Self-pay

## 2020-06-13 VITALS — BP 137/88 | HR 96 | Resp 20 | Ht 70.0 in | Wt 340.8 lb

## 2020-06-13 DIAGNOSIS — E119 Type 2 diabetes mellitus without complications: Secondary | ICD-10-CM | POA: Diagnosis not present

## 2020-06-13 DIAGNOSIS — I10 Essential (primary) hypertension: Secondary | ICD-10-CM | POA: Diagnosis not present

## 2020-06-13 DIAGNOSIS — Z6841 Body Mass Index (BMI) 40.0 and over, adult: Secondary | ICD-10-CM | POA: Insufficient documentation

## 2020-06-13 DIAGNOSIS — Z1211 Encounter for screening for malignant neoplasm of colon: Secondary | ICD-10-CM | POA: Diagnosis not present

## 2020-06-13 LAB — POCT GLYCOSYLATED HEMOGLOBIN (HGB A1C): HbA1c, POC (controlled diabetic range): 8.6 % — AB (ref 0.0–7.0)

## 2020-06-13 LAB — GLUCOSE, POCT (MANUAL RESULT ENTRY): POC Glucose: 182 mg/dl — AB (ref 70–99)

## 2020-06-13 MED ORDER — POTASSIUM CHLORIDE CRYS ER 20 MEQ PO TBCR: 20.0000 meq | EXTENDED_RELEASE_TABLET | Freq: Every day | ORAL | 3 refills | Status: DC

## 2020-06-13 MED ORDER — TRULICITY 1.5 MG/0.5ML ~~LOC~~ SOAJ: 1.5000 mg | SUBCUTANEOUS | 2 refills | Status: DC

## 2020-06-13 MED ORDER — ATORVASTATIN CALCIUM 40 MG PO TABS: 40.0000 mg | ORAL_TABLET | Freq: Every day | ORAL | 3 refills | Status: DC

## 2020-06-13 MED ORDER — EMPAGLIFLOZIN 10 MG PO TABS: 10.0000 mg | ORAL_TABLET | Freq: Every day | ORAL | 3 refills | Status: DC

## 2020-06-13 MED ORDER — METFORMIN HCL 500 MG PO TABS
1000.0000 mg | ORAL_TABLET | Freq: Two times a day (BID) | ORAL | 2 refills | Status: DC
Start: 2020-06-13 — End: 2020-07-28

## 2020-06-13 MED ORDER — LOSARTAN POTASSIUM 100 MG PO TABS: 100.0000 mg | ORAL_TABLET | Freq: Every day | ORAL | 3 refills | Status: DC

## 2020-06-13 MED ORDER — AMLODIPINE BESYLATE 10 MG PO TABS: 10.0000 mg | ORAL_TABLET | Freq: Every day | ORAL | 1 refills | Status: DC

## 2020-06-13 NOTE — Assessment & Plan Note (Signed)
Type 2 diabetes not yet at goal A1c 8.6 will begin Jardiance 10 mg daily continue Trulicity and metformin I have advised the patient continue an exercise program and read educated the patient as to proper diet  Patient to return to see our clinical pharmacist in 2 weeks and see Dr. Joya Gaskins again in 2 months

## 2020-06-13 NOTE — Assessment & Plan Note (Signed)
Blood pressure under good control continue losartan and amlodipine follow-up metabolic panel blood count

## 2020-06-13 NOTE — Patient Instructions (Signed)
Begin Jardiance 1 pill daily Stay on Trulicity and metformin as you are taking  No other medication changes  Refills on all medications sent to your [facility] pharmacy  Referral for colonoscopy was sent again  Please get your eye checked to have your retinal check with your diabetes eye resources given  Please increase your activity levels 30 minutes walking 3-4 times a week  Follow a healthy diet as outlined below  Labs today were obtained  Return to see Lurena Joiner our clinical pharmacist in 2 weeks for diabetes education and recheck and then see Dr. Joya Gaskins again in 2 months   Diabetes Mellitus and Nutrition, Adult When you have diabetes, or diabetes mellitus, it is very important to have healthy eating habits because your blood sugar (glucose) levels are greatly affected by what you eat and drink. Eating healthy foods in the right amounts, at about the same times every day, can help you:  Control your blood glucose.  Lower your risk of heart disease.  Improve your blood pressure.  Reach or maintain a healthy weight. What can affect my meal plan? Every person with diabetes is different, and each person has different needs for a meal plan. Your health care provider may recommend that you work with a dietitian to make a meal plan that is best for you. Your meal plan may vary depending on factors such as:  The calories you need.  The medicines you take.  Your weight.  Your blood glucose, blood pressure, and cholesterol levels.  Your activity level.  Other health conditions you have, such as heart or kidney disease. How do carbohydrates affect me? Carbohydrates, also called carbs, affect your blood glucose level more than any other type of food. Eating carbs naturally raises the amount of glucose in your blood. Carb counting is a method for keeping track of how many carbs you eat. Counting carbs is important to keep your blood glucose at a healthy level, especially if you use  insulin or take certain oral diabetes medicines. It is important to know how many carbs you can safely have in each meal. This is different for every person. Your dietitian can help you calculate how many carbs you should have at each meal and for each snack. How does alcohol affect me? Alcohol can cause a sudden decrease in blood glucose (hypoglycemia), especially if you use insulin or take certain oral diabetes medicines. Hypoglycemia can be a life-threatening condition. Symptoms of hypoglycemia, such as sleepiness, dizziness, and confusion, are similar to symptoms of having too much alcohol.  Do not drink alcohol if: ? Your health care provider tells you not to drink. ? You are pregnant, may be pregnant, or are planning to become pregnant.  If you drink alcohol: ? Do not drink on an empty stomach. ? Limit how much you use to:  0-1 drink a day for women.  0-2 drinks a day for men. ? Be aware of how much alcohol is in your drink. In the [place] one drink equals one 12 oz bottle of beer (355 mL), one 5 oz glass of wine (148 mL), or one 1 oz glass of hard liquor (44 mL). ? Keep yourself hydrated with water, diet soda, or unsweetened iced tea.  Keep in mind that regular soda, juice, and other mixers may contain a lot of sugar and must be counted as carbs. What are tips for following this plan? Reading food labels  Start by checking the serving size on the "Nutrition Facts" label of  packaged foods and drinks. The amount of calories, carbs, fats, and other nutrients listed on the label is based on one serving of the item. Many items contain more than one serving per package.  Check the total grams (g) of carbs in one serving. You can calculate the number of servings of carbs in one serving by dividing the total carbs by 15. For example, if a food has 30 g of total carbs per serving, it would be equal to 2 servings of carbs.  Check the number of grams (g) of saturated fats and trans fats in one  serving. Choose foods that have a low amount or none of these fats.  Check the number of milligrams (mg) of salt (sodium) in one serving. Most people should limit total sodium intake to less than 2,300 mg per day.  Always check the nutrition information of foods labeled as "low-fat" or "nonfat." These foods may be higher in added sugar or refined carbs and should be avoided.  Talk to your dietitian to identify your daily goals for nutrients listed on the label. Shopping  Avoid buying canned, pre-made, or processed foods. These foods tend to be high in fat, sodium, and added sugar.  Shop around the outside edge of the grocery store. This is where you will most often find fresh fruits and vegetables, bulk grains, fresh meats, and fresh dairy. Cooking  Use low-heat cooking methods, such as baking, instead of high-heat cooking methods like deep frying.  Cook using healthy oils, such as olive, canola, or sunflower oil.  Avoid cooking with butter, cream, or high-fat meats. Meal planning  Eat meals and snacks regularly, preferably at the same times every day. Avoid going long periods of time without eating.  Eat foods that are high in fiber, such as fresh fruits, vegetables, beans, and whole grains. Talk with your dietitian about how many servings of carbs you can eat at each meal.  Eat 4-6 oz (112-168 g) of lean protein each day, such as lean meat, chicken, fish, eggs, or tofu. One ounce (oz) of lean protein is equal to: ? 1 oz (28 g) of meat, chicken, or fish. ? 1 egg. ?  cup (62 g) of tofu.  Eat some foods each day that contain healthy fats, such as avocado, nuts, seeds, and fish.   What foods should I eat? Fruits Berries. Apples. Oranges. Peaches. Apricots. Plums. Grapes. Mango. Papaya. Pomegranate. Kiwi. Cherries. Vegetables Lettuce. Spinach. Leafy greens, including kale, chard, collard greens, and mustard greens. Beets. Cauliflower. Cabbage. Broccoli. Carrots. Green beans.  Tomatoes. Peppers. Onions. Cucumbers. Brussels sprouts. Grains Whole grains, such as whole-wheat or whole-grain bread, crackers, tortillas, cereal, and pasta. Unsweetened oatmeal. Quinoa. Brown or wild rice. Meats and other proteins Seafood. Poultry without skin. Lean cuts of poultry and beef. Tofu. Nuts. Seeds. Dairy Low-fat or fat-free dairy products such as milk, yogurt, and cheese. The items listed above may not be a complete list of foods and beverages you can eat. Contact a dietitian for more information. What foods should I avoid? Fruits Fruits canned with syrup. Vegetables Canned vegetables. Frozen vegetables with butter or cream sauce. Grains Refined white flour and flour products such as bread, pasta, snack foods, and cereals. Avoid all processed foods. Meats and other proteins Fatty cuts of meat. Poultry with skin. Breaded or fried meats. Processed meat. Avoid saturated fats. Dairy Full-fat yogurt, cheese, or milk. Beverages Sweetened drinks, such as soda or iced tea. The items listed above may not be a complete list  of foods and beverages you should avoid. Contact a dietitian for more information. Questions to ask a health care provider  Do I need to meet with a diabetes educator?  Do I need to meet with a dietitian?  What number can I call if I have questions?  When are the best times to check my blood glucose? Where to find more information:  American Diabetes Association: diabetes.org  Academy of Nutrition and Dietetics: www.eatright.[employer] of Diabetes and Digestive and Kidney Diseases: [employer]  Association of Diabetes Care and Education Specialists: www.diabeteseducator.org Summary  It is important to have healthy eating habits because your blood sugar (glucose) levels are greatly affected by what you eat and drink.  A healthy meal plan will help you control your blood glucose and maintain a healthy lifestyle.  Your health care  provider may recommend that you work with a dietitian to make a meal plan that is best for you.  Keep in mind that carbohydrates (carbs) and alcohol have immediate effects on your blood glucose levels. It is important to count carbs and to use alcohol carefully. This information is not intended to replace advice given to you by your health care provider. Make sure you discuss any questions you have with your health care provider. Document Revised: 01/05/2019 Document Reviewed: 01/05/2019 Elsevier Patient Education  2021 [employer].

## 2020-06-13 NOTE — Assessment & Plan Note (Signed)
Patient is yet to receive a call from gastroenterology we will send another referral over

## 2020-06-13 NOTE — Assessment & Plan Note (Signed)
Patient given diet and exercise education

## 2020-06-14 ENCOUNTER — Telehealth: Payer: Self-pay

## 2020-06-14 LAB — CBC WITH DIFFERENTIAL/PLATELET
Basophils Absolute: 0.1 10*3/uL (ref 0.0–0.2)
Basos: 1 %
EOS (ABSOLUTE): 0.1 10*3/uL (ref 0.0–0.4)
Eos: 1 %
Hematocrit: 41.9 % (ref 37.5–51.0)
Hemoglobin: 13 g/dL (ref 13.0–17.7)
Immature Grans (Abs): 0 10*3/uL (ref 0.0–0.1)
Immature Granulocytes: 0 %
Lymphocytes Absolute: 1.9 10*3/uL (ref 0.7–3.1)
Lymphs: 22 %
MCH: 27.2 pg (ref 26.6–33.0)
MCHC: 31 g/dL — ABNORMAL LOW (ref 31.5–35.7)
MCV: 88 fL (ref 79–97)
Monocytes Absolute: 0.6 10*3/uL (ref 0.1–0.9)
Monocytes: 7 %
Neutrophils Absolute: 5.9 10*3/uL (ref 1.4–7.0)
Neutrophils: 69 %
Platelets: 285 10*3/uL (ref 150–450)
RBC: 4.78 x10E6/uL (ref 4.14–5.80)
RDW: 13.5 % (ref 11.6–15.4)
WBC: 8.5 10*3/uL (ref 3.4–10.8)

## 2020-06-14 LAB — COMPREHENSIVE METABOLIC PANEL
ALT: 18 IU/L (ref 0–44)
AST: 13 IU/L (ref 0–40)
Albumin/Globulin Ratio: 1.5 (ref 1.2–2.2)
Albumin: 4.7 g/dL (ref 4.0–5.0)
Alkaline Phosphatase: 94 IU/L (ref 44–121)
BUN/Creatinine Ratio: 11 (ref 9–20)
BUN: 13 mg/dL (ref 6–24)
Bilirubin Total: 0.4 mg/dL (ref 0.0–1.2)
CO2: 20 mmol/L (ref 20–29)
Calcium: 9.7 mg/dL (ref 8.7–10.2)
Chloride: 102 mmol/L (ref 96–106)
Creatinine, Ser: 1.16 mg/dL (ref 0.76–1.27)
Globulin, Total: 3.2 g/dL (ref 1.5–4.5)
Glucose: 183 mg/dL — ABNORMAL HIGH (ref 65–99)
Potassium: 4.3 mmol/L (ref 3.5–5.2)
Sodium: 141 mmol/L (ref 134–144)
Total Protein: 7.9 g/dL (ref 6.0–8.5)
eGFR: 79 mL/min/{1.73_m2} (ref >59)

## 2020-06-14 NOTE — Telephone Encounter (Signed)
-----   Message from Elsie Stain, MD sent at 06/14/2020  8:01 AM EDT ----- Let pt know liver, kidney blood counts are normal

## 2020-06-14 NOTE — Telephone Encounter (Signed)
Pt informed of lab results normal verbalized understanding and voiced no other concerns.

## 2020-06-27 ENCOUNTER — Other Ambulatory Visit: Payer: Self-pay

## 2020-06-27 ENCOUNTER — Encounter: Payer: Self-pay | Admitting: Pharmacist

## 2020-06-27 ENCOUNTER — Ambulatory Visit: Payer: BC Managed Care – PPO | Attending: Critical Care Medicine | Admitting: Pharmacist

## 2020-06-27 DIAGNOSIS — E119 Type 2 diabetes mellitus without complications: Secondary | ICD-10-CM

## 2020-06-27 LAB — GLUCOSE, POCT (MANUAL RESULT ENTRY): POC Glucose: 189 mg/dl — AB (ref 70–99)

## 2020-06-27 NOTE — Progress Notes (Signed)
    S:     No chief complaint on file.  Patient arrives well and in good spirits.  Presents for diabetes evaluation, education, and management. Patient was referred and last seen by Primary Care Provider, Dr. Joya Gaskins, on 06/13/2020. A1c revealed improved glycemic control but was still not at goal. Dr. Joya Gaskins added Vania Rea.   Today, pt reports compliance with metformin and Trulicity but has not started [place]. He denies hypoglycemia. He denies GI sx with Trulicity or metformin. He did not bring his glucometer today and reports that wife takes his BG at home.   Family/Social History: non-smoker, family history of hypertension and diabetes  Insurance coverage/medication affordability: Cigna   Medication adherence reported. Current diabetes medications include: Jardiance 10 mg daily (not taking), metformin 7124 mg BID, Trulicity 1.5 mg weekly Current hypertension medications include: losartan 100 mg daily  Current hyperlipidemia medications include: atorvastatin 40 mg daily   Patient denies hypoglycemic events.  Patient reported dietary habits: Eats 3 meals/day Pt reports that his wife is only buying "good foods and threw out all the junk foods". Morning star foods.    Patient-reported exercise habits: starting to exercise on weekends with home machine   Patient reports nocturia (nighttime urination).  Patient denies neuropathy (nerve pain). Patient denies visual changes. Patient reports self foot exams.     O:  POCT: 189   Lab Results  Component Value Date   HGBA1C 8.6 (A) 06/13/2020   There were no vitals filed for this visit.  Lipid Panel     Component Value Date/Time   CHOL 208 (H) 02/01/2020 1136   TRIG 139 02/01/2020 1136   HDL 48 02/01/2020 1136   CHOLHDL 4.3 02/01/2020 1136   LDLCALC 135 (H) 02/01/2020 1136    Clinical Atherosclerotic Cardiovascular Disease (ASCVD): No  The 10-year ASCVD risk score Mikey Bussing DC Jr., et al., 2013) is: 15.3%   Values used to  calculate the score:     Age: 45 years     Sex: Male     Is Non-Hispanic African American: Yes     Diabetic: Yes     Tobacco smoker: No     Systolic Blood Pressure: 580 mmHg     Is BP treated: Yes     HDL Cholesterol: 48 mg/dL     Total Cholesterol: 208 mg/dL   A/P: Diabetes longstanding currently uncontrolled with recent A1c of 8.6 (goal <7). Patient is able to verbalize appropriate hypoglycemia management plan. Medication adherence appears suboptimal. Control is suboptimal due to dietary indiscretion and physical inactivity.  -Continue metformin 1000 mg to twice daily -Continue Trulicity 1.5 mg weekly.  -Start Jardiance as prescribed. -Encouraged patient to pick-up and begin -Extensively discussed pathophysiology of diabetes, recommended lifestyle interventions, dietary effects on blood sugar control -Counseled on s/sx of and management of hypoglycemia -Next A1C anticipated 09/2020.  Written patient instructions provided.  Total time in face to face counseling 15 minutes.   Follow up PCP Clinic Visit in 1 month with me.     Benard Halsted, PharmD, [facility], [place] [phone]

## 2020-06-30 IMAGING — CT CT RENAL STONE PROTOCOL
2 of 4 series · 16 of 46 positions shown, 18 images · non-contrast
Comparison: 04/12/2018

CLINICAL DATA: Left flank, left lower quadrant pain, nausea,
history of stones

EXAM:
CT ABDOMEN AND PELVIS WITHOUT CONTRAST
TECHNIQUE: Multidetector CT imaging of the abdomen and pelvis was performed
following the standard protocol without IV contrast.

[Series 2: axial st · axial · 0.98mm/px · z∈[-592,-77]mm · 13 of 113 slices shown, 15 images]
[im 5/113  soft-tissue]
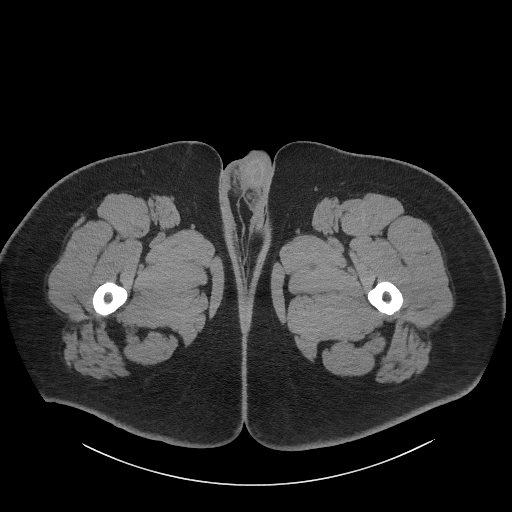
[im 5/113  bone]
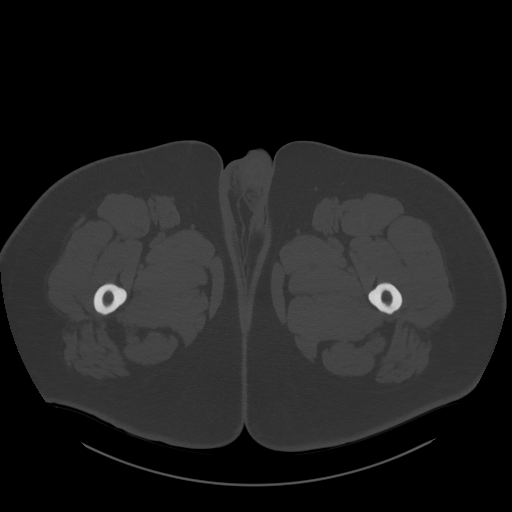
[im 15/113  soft-tissue]
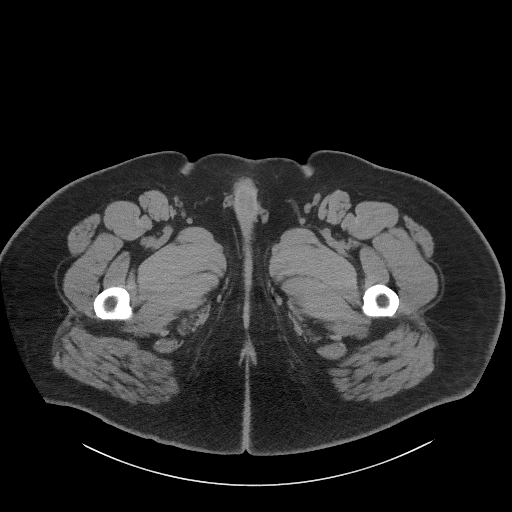
[im 24/113  soft-tissue]
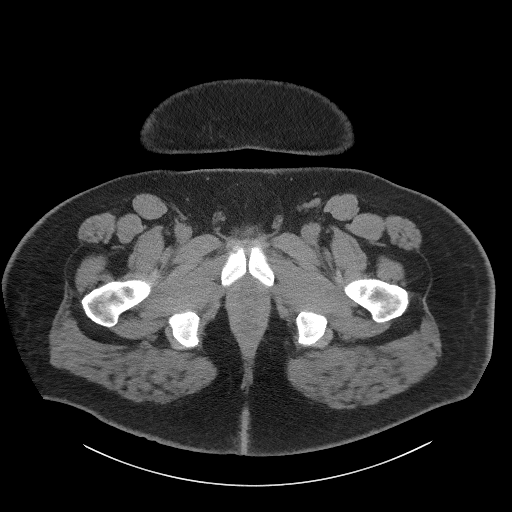
[im 33/113  soft-tissue]
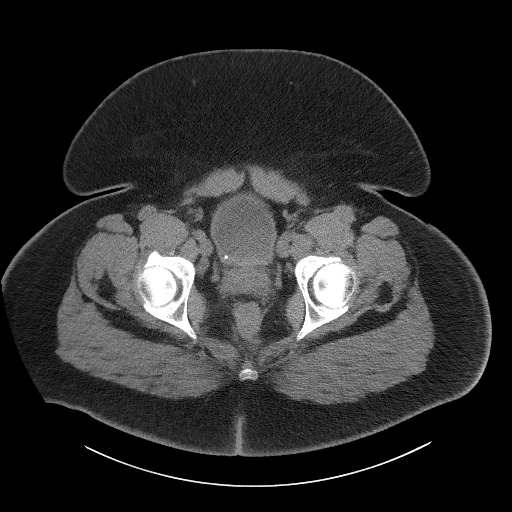
[im 38/113  soft-tissue]
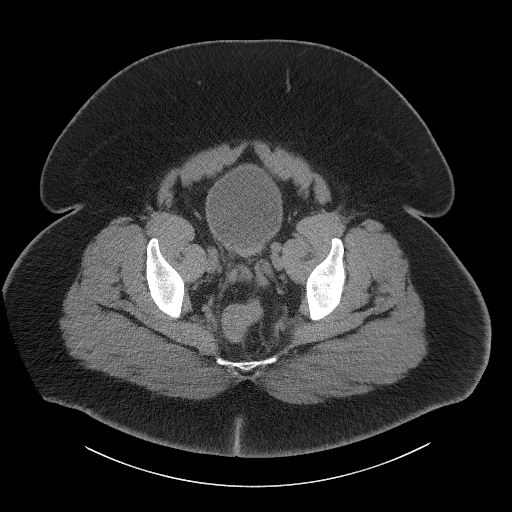
[im 47/113  soft-tissue]
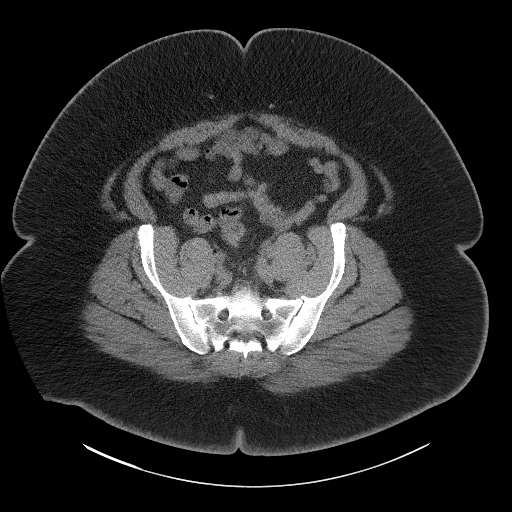
[im 57/113  soft-tissue]
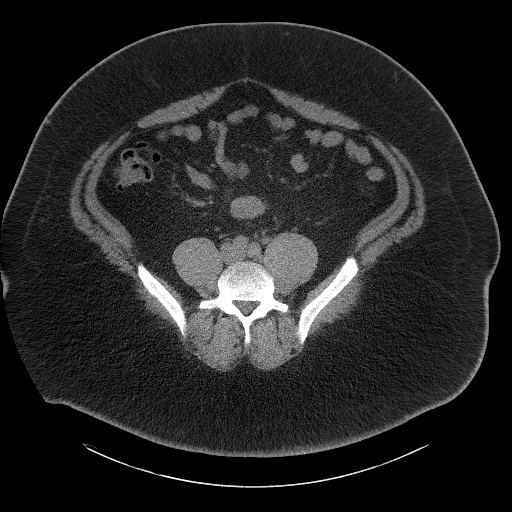
[im 66/113  soft-tissue]
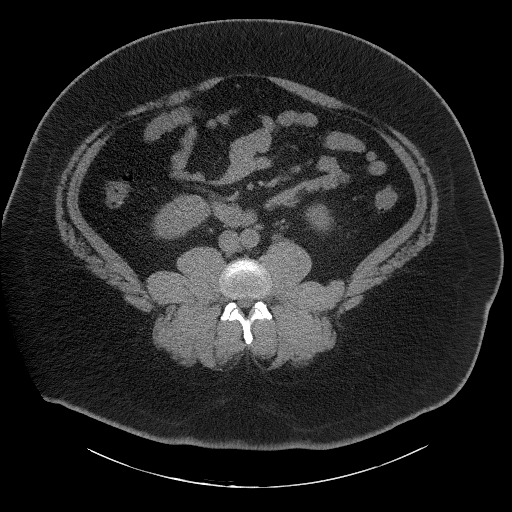
[im 75/113  soft-tissue]
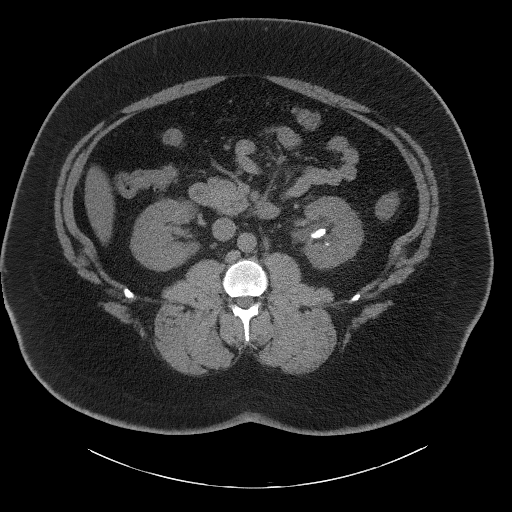
[im 75/113  bone]
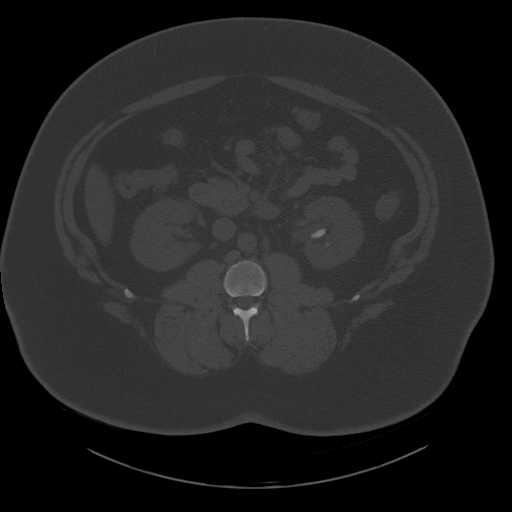
[im 80/113  soft-tissue]
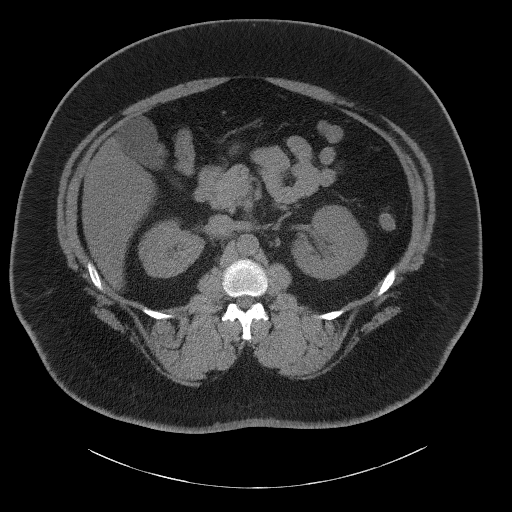
[im 89/113  soft-tissue]
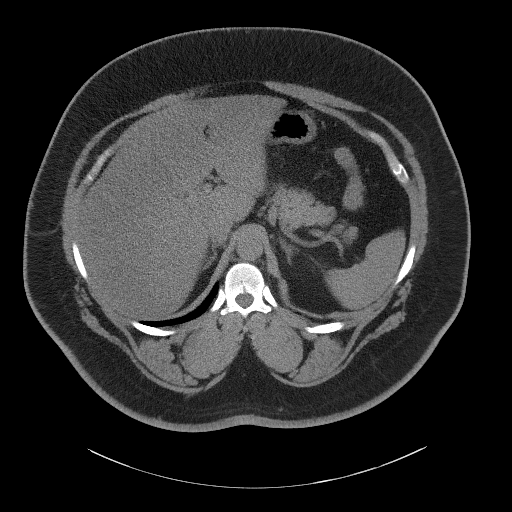
[im 99/113  soft-tissue]
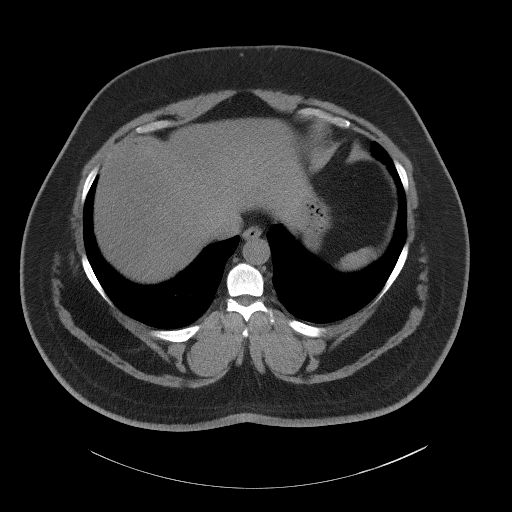
[im 108/113  soft-tissue]
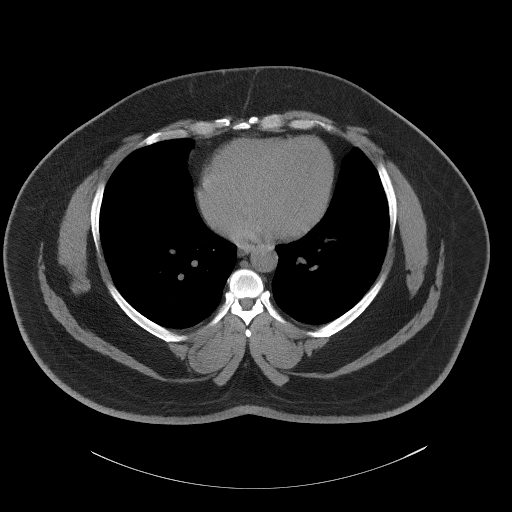

[Series 5: coronal st · coronal · 1.04mm/px · 3 of 146 slices shown]
[im 49/146  soft-tissue]
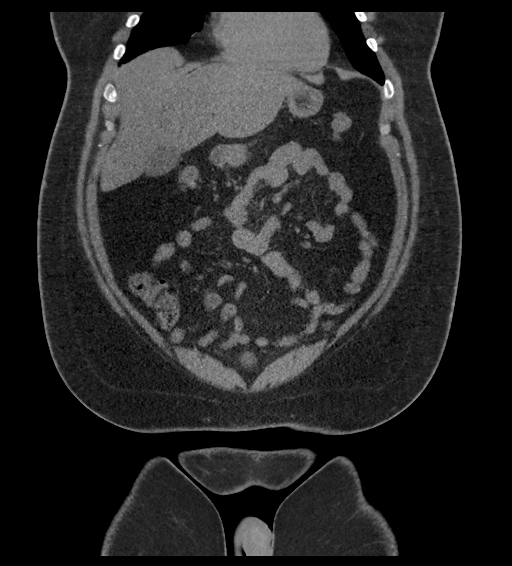
[im 65/146  soft-tissue]
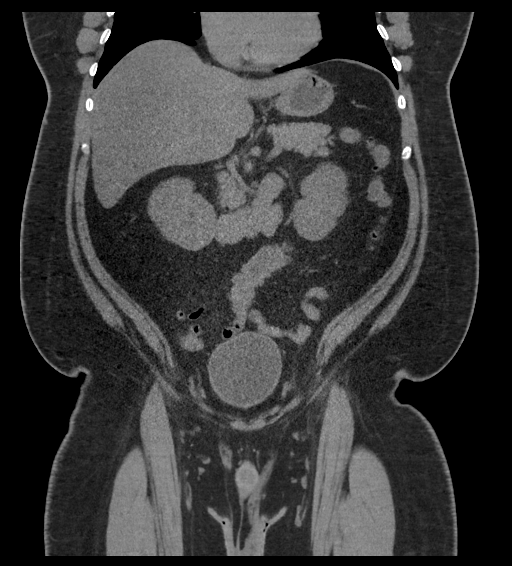
[im 81/146  soft-tissue]
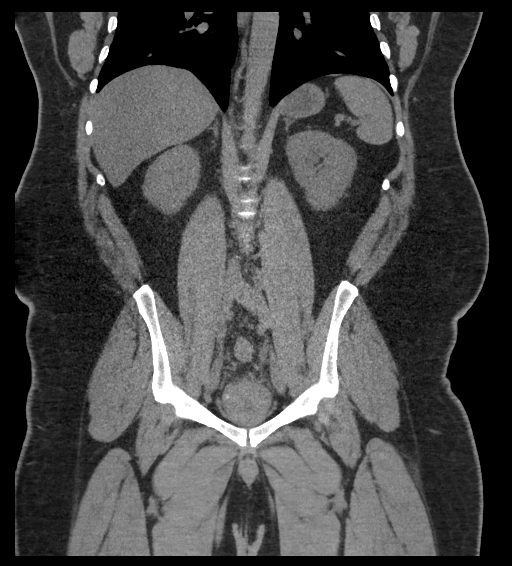

[16 of 46 positions shown; findings below may reference images not displayed]

FINDINGS: Lower chest: No acute abnormality.

Hepatobiliary: No focal liver abnormality is seen. Hepatic
steatosis. No gallstones, gallbladder wall thickening, or biliary
dilatation.

Pancreas: Unremarkable. No pancreatic ductal dilatation or
surrounding inflammatory changes.

Spleen: Normal in size without focal abnormality.

Adrenals/Urinary Tract: Adrenal glands are unremarkable. There is a
2 mm calculus at the most distal left ureter (series 3, image 79)
with mild left hydronephrosis and hydroureter. There are multiple
additional nonobstructive calculi in the inferior pole of the left
kidney. There is a 2 mm calculus in the right aspect of the
dependent right urinary bladder (series, image 81). No evidence of
right renal or ureteral calculus.

Stomach/Bowel: Stomach is within normal limits. Appendix appears
normal. No evidence of bowel wall thickening, distention, or
inflammatory changes.

Vascular/Lymphatic: No significant vascular findings are present. No
enlarged abdominal or pelvic lymph nodes.

Reproductive: No mass or other abnormality.

Other: No abdominal wall hernia or abnormality. No abdominopelvic
ascites.

Musculoskeletal: No acute or significant osseous findings.
IMPRESSION: 1. There is a 2 mm calculus at the most distal left ureter (series
3, image 79) with mild left hydronephrosis and hydroureter. There
are multiple additional nonobstructive calculi in the inferior pole
of the left kidney. There is a 2 mm calculus in the right aspect of
the dependent right urinary bladder (series, image 81). No evidence
of right renal or ureteral calculus.

2.  Hepatic steatosis.

## 2020-07-28 ENCOUNTER — Encounter: Payer: Self-pay | Admitting: Pharmacist

## 2020-07-28 ENCOUNTER — Other Ambulatory Visit: Payer: Self-pay

## 2020-07-28 ENCOUNTER — Ambulatory Visit: Payer: BC Managed Care – PPO | Attending: Critical Care Medicine | Admitting: Pharmacist

## 2020-07-28 DIAGNOSIS — E119 Type 2 diabetes mellitus without complications: Secondary | ICD-10-CM

## 2020-07-28 LAB — GLUCOSE, POCT (MANUAL RESULT ENTRY): POC Glucose: 230 mg/dl — AB (ref 70–99)

## 2020-07-28 MED ORDER — METFORMIN HCL ER 500 MG PO TB24: 1000.0000 mg | ORAL_TABLET | Freq: Two times a day (BID) | ORAL | 2 refills | Status: DC

## 2020-07-28 MED ORDER — TRULICITY 3 MG/0.5ML ~~LOC~~ SOAJ: 3.0000 mg | SUBCUTANEOUS | 2 refills | Status: DC

## 2020-07-28 MED ORDER — EMPAGLIFLOZIN 10 MG PO TABS: 10.0000 mg | ORAL_TABLET | Freq: Every day | ORAL | 3 refills | Status: DC

## 2020-07-28 NOTE — Progress Notes (Signed)
    S:    PCP: Dr. Joya Gaskins  No chief complaint on file.  Patient arrives well and in good spirits. Presents for diabetes evaluation, education, and management. Patient was referred and last seen by Primary Care Provider, Dr. Joya Gaskins, on 06/13/2020. I saw him on 06/27/2020. I made no changes as pt had not started his Jardiance.   Today, pt reports compliance with metformin and Trulicity but has still not started Jardiance. He denies hypoglycemia. He denies GI sx with Trulicity or metformin. He did not bring his glucometer today and reports that wife takes his BG at home.   Family/Social History: non-smoker, family history of hypertension and diabetes  Insurance coverage/medication affordability: Cigna   Medication adherence denied. Current diabetes medications include: Jardiance 10 mg daily (not taking), metformin 1000 mg BID (endorses occasional diarrhea), Trulicity 1.5 mg weekly Current hypertension medications include: losartan 100 mg daily  Current hyperlipidemia medications include: atorvastatin 40 mg daily   Patient denies hypoglycemic events.  Patient reported dietary habits: Eats 3 meals/day - Pt reports that his wife is only buying "good foods and threw out all the junk foods". Morning star foods.    Patient-reported exercise habits: starting to exercise on weekends with home machine   Patient reports nocturia (nighttime urination).  Patient denies neuropathy (nerve pain). Patient denies visual changes. Patient reports self foot exams.     O:  POCT: 546 (slice of pizza this AM)   Lab Results  Component Value Date   HGBA1C 8.6 (A) 06/13/2020   There were no vitals filed for this visit.  Lipid Panel     Component Value Date/Time   CHOL 208 (H) 02/01/2020 1136   TRIG 139 02/01/2020 1136   HDL 48 02/01/2020 1136   CHOLHDL 4.3 02/01/2020 1136   LDLCALC 135 (H) 02/01/2020 1136    Clinical Atherosclerotic Cardiovascular Disease (ASCVD): No  The 10-year ASCVD risk score  Mikey Bussing DC Jr., et al., 2013) is: 15.3%   Values used to calculate the score:     Age: 45 years     Sex: Male     Is Non-Hispanic African American: Yes     Diabetic: Yes     Tobacco smoker: No     Systolic Blood Pressure: 270 mmHg     Is BP treated: Yes     HDL Cholesterol: 48 mg/dL     Total Cholesterol: 208 mg/dL   Home CBGs: no meter with him today. Endorses low 200s.   A/P: Diabetes longstanding currently uncontrolled with recent A1c of 8.6 (goal <7). Patient is able to verbalize appropriate hypoglycemia management plan. Medication adherence appears suboptimal. Control is suboptimal due to dietary indiscretion and physical inactivity.  -Continue metformin 1000 mg to twice daily -Continue Trulicity 1.5 mg weekly.  -Start Jardiance as prescribed. -Encouraged patient to pick-up and begin -Extensively discussed pathophysiology of diabetes, recommended lifestyle interventions, dietary effects on blood sugar control -Counseled on s/sx of and management of hypoglycemia -Next A1C anticipated 09/2020.  Written patient instructions provided.  Total time in face to face counseling 15 minutes.   Follow up PCP Clinic Visit in 1 month with me.     Benard Halsted, PharmD, [facility], [place] [phone]

## 2020-08-15 ENCOUNTER — Ambulatory Visit: Payer: BC Managed Care – PPO | Admitting: Critical Care Medicine

## 2020-08-25 ENCOUNTER — Ambulatory Visit (AMBULATORY_SURGERY_CENTER): Payer: Self-pay | Admitting: *Deleted

## 2020-08-25 ENCOUNTER — Other Ambulatory Visit: Payer: Self-pay

## 2020-08-25 VITALS — Ht 69.0 in | Wt 338.0 lb

## 2020-08-25 DIAGNOSIS — Z1211 Encounter for screening for malignant neoplasm of colon: Secondary | ICD-10-CM

## 2020-08-25 MED ORDER — PLENVU 140 G PO SOLR: 1.0000 | Freq: Once | ORAL | 0 refills | Status: AC

## 2020-08-25 NOTE — Progress Notes (Signed)
No egg or soy allergy known to patient  No issues with past sedation with any surgeries or procedures Patient denies ever being told they had issues or difficulty with intubation  No FH of Malignant Hyperthermia No diet pills per patient No home 02 use per patient  No blood thinners per patient  Pt denies issues with constipation  No A fib or A flutter  EMMI video to pt or via [place] 19 guidelines implemented in [place] today with Pt and RN     Coupon given to pt in PV today NO PA's for preps discussed with pt In PV today  Discussed with pt there will be an out-of-pocket cost for prep and that varies from $0 to 70 dollars   Due to the COVID-19 pandemic we are asking patients to follow certain guidelines.  Pt aware of COVID protocols and [facility] guidelines

## 2020-09-08 ENCOUNTER — Other Ambulatory Visit: Payer: Self-pay

## 2020-09-08 ENCOUNTER — Encounter: Payer: Self-pay | Admitting: Gastroenterology

## 2020-09-08 ENCOUNTER — Ambulatory Visit (AMBULATORY_SURGERY_CENTER): Payer: BC Managed Care – PPO | Admitting: Gastroenterology

## 2020-09-08 VITALS — BP 142/95 | HR 90 | Temp 98.4°F | Resp 16 | Ht 69.0 in | Wt 338.0 lb

## 2020-09-08 DIAGNOSIS — D128 Benign neoplasm of rectum: Secondary | ICD-10-CM

## 2020-09-08 DIAGNOSIS — Z1211 Encounter for screening for malignant neoplasm of colon: Secondary | ICD-10-CM | POA: Diagnosis not present

## 2020-09-08 DIAGNOSIS — D123 Benign neoplasm of transverse colon: Secondary | ICD-10-CM | POA: Diagnosis not present

## 2020-09-08 MED ORDER — SODIUM CHLORIDE 0.9 % IV SOLN
500.0000 mL | Freq: Once | INTRAVENOUS | Status: DC
Start: 2020-09-08 — End: 2020-09-08

## 2020-09-08 NOTE — Progress Notes (Signed)
Called to room to assist during endoscopic procedure.  Patient ID and intended procedure confirmed with present staff. Received instructions for my participation in the procedure from the performing physician.  

## 2020-09-08 NOTE — Patient Instructions (Signed)
Information on polyps and diverticulosis given to you today.  Await pathology results.  Resume previous diet and medications.  Drink at least 64 oz of water each day and use a stool bulking agent like Metamucil.  YOU HAD AN ENDOSCOPIC PROCEDURE TODAY AT [facility]:   Refer to the procedure report that was given to you for any specific questions about what was found during the examination.  If the procedure report does not answer your questions, please call your gastroenterologist to clarify.  If you requested that your care partner not be given the details of your procedure findings, then the procedure report has been included in a sealed envelope for you to review at your convenience later.  YOU SHOULD EXPECT: Some feelings of bloating in the abdomen. Passage of more gas than usual.  Walking can help get rid of the air that was put into your GI tract during the procedure and reduce the bloating. If you had a lower endoscopy (such as a colonoscopy or flexible sigmoidoscopy) you may notice spotting of blood in your stool or on the toilet paper. If you underwent a bowel prep for your procedure, you may not have a normal bowel movement for a few days.  Please Note:  You might notice some irritation and congestion in your nose or some drainage.  This is from the oxygen used during your procedure.  There is no need for concern and it should clear up in a day or so.  SYMPTOMS TO REPORT IMMEDIATELY:  Following lower endoscopy (colonoscopy or flexible sigmoidoscopy):  Excessive amounts of blood in the stool  Significant tenderness or worsening of abdominal pains  Swelling of the abdomen that is new, acute  Fever of 100F or higher  For urgent or emergent issues, a gastroenterologist can be reached at any hour by calling [phone]. Do not use MyChart messaging for urgent concerns.    DIET:  We do recommend a small meal at first, but then you may proceed to your regular diet.   Drink plenty of fluids but you should avoid alcoholic beverages for 24 hours.  ACTIVITY:  You should plan to take it easy for the rest of today and you should NOT DRIVE or use heavy machinery until tomorrow (because of the sedation medicines used during the test).    FOLLOW UP: Our staff will call the number listed on your records 48-72 hours following your procedure to check on you and address any questions or concerns that you may have regarding the information given to you following your procedure. If we do not reach you, we will leave a message.  We will attempt to reach you two times.  During this call, we will ask if you have developed any symptoms of COVID 19. If you develop any symptoms (ie: fever, flu-like symptoms, shortness of breath, cough etc.) before then, please call [phone].  If you test positive for Covid 19 in the 2 weeks post procedure, please call and report this information to [place].    If any biopsies were taken you will be contacted by phone or by letter within the next 1-3 weeks.  Please call us at [phone] if you have not heard about the biopsies in 3 weeks.    SIGNATURES/CONFIDENTIALITY: You and/or your care partner have signed paperwork which will be entered into your electronic medical record.  These signatures attest to the fact that that the information above on your After Visit Summary has been reviewed and  is understood.  Full responsibility of the confidentiality of this discharge information lies with you and/or your care-partner.

## 2020-09-08 NOTE — Op Note (Signed)
[facility] Patient Name: Jason Bruce Procedure Date: 09/08/2020 9:19 AM MRN: [id number] Endoscopist: Thornton Park MD, MD Age: 45 Referring MD:  Date of Birth: 07/28/1975 Gender: Male Account #: [id number] Procedure:                Colonoscopy Indications:              Screening for colorectal malignant neoplasm, This                            is the patient's first colonoscopy                           No known family history of colon cancer or polyps Medicines:                Monitored Anesthesia Care Procedure:                Pre-Anesthesia Assessment:                           - Prior to the procedure, a History and Physical                            was performed, and patient medications and                            allergies were reviewed. The patient's tolerance of                            previous anesthesia was also reviewed. The risks                            and benefits of the procedure and the sedation                            options and risks were discussed with the patient.                            All questions were answered, and informed consent                            was obtained. Prior Anticoagulants: The patient has                            taken no previous anticoagulant or antiplatelet                            agents. ASA Grade Assessment: III - A patient with                            severe systemic disease. After reviewing the risks                            and benefits, the patient was deemed in  satisfactory condition to undergo the procedure.                           After obtaining informed consent, the colonoscope                            was passed under direct vision. Throughout the                            procedure, the patient's blood pressure, pulse, and                            oxygen saturations were monitored continuously. The                            CF [id number]  was introduced through the anus                            and advanced to the 4 cm into the ileum. A second                            forward view of the right colon was performed. The                            colonoscopy was performed with moderate difficulty                            due to a redundant colon and significant looping.                            Successful completion of the procedure was aided by                            withdrawing and reinserting the scope and applying                            abdominal pressure. The patient tolerated the                            procedure well. The quality of the bowel                            preparation was good. The terminal ileum, ileocecal                            valve, appendiceal orifice, and rectum were                            photographed. Scope In: 9:32:39 AM Scope Out: 9:50:14 AM Scope Withdrawal Time: 0 hours 12 minutes 55 seconds  Total Procedure Duration: 0 hours 17 minutes 35 seconds  Findings:                 The perianal and digital rectal examinations were  normal.                           Multiple small and large-mouthed diverticula were                            found in the entire colon.                           A 2 mm polyp was found in the rectum. The polyp was                            sessile. The polyp was removed with a cold snare.                            Resection and retrieval were complete. Estimated                            blood loss was minimal.                           A 3 mm polyp was found in the splenic flexure. The                            polyp was sessile. The polyp was removed with a                            cold snare. Resection and retrieval were complete.                            Estimated blood loss was minimal.                           The exam was otherwise without abnormality on                            direct and  retroflexion views. Complications:            No immediate complications. Estimated blood loss:                            Minimal. Estimated Blood Loss:     Estimated blood loss was minimal. Impression:               - Diverticulosis in the entire examined colon.                           - One 2 mm polyp in the rectum, removed with a cold                            snare. Resected and retrieved.                           - One 3 mm polyp at the splenic flexure, removed  with a cold snare. Resected and retrieved.                           - The examination was otherwise normal on direct                            and retroflexion views. Recommendation:           - Patient has a contact number available for                            emergencies. The signs and symptoms of potential                            delayed complications were discussed with the                            patient. Return to normal activities tomorrow.                            Written discharge instructions were provided to the                            patient.                           - Follow a high fiber diet. Drink at least 64                            ounces of water daily. Add a daily stool bulking                            agent such as psyllium (an exampled would be                            Metamucil).                           - Continue present medications.                           - Await pathology results.                           - Repeat colonoscopy date to be determined after                            pending pathology results are reviewed for                            surveillance.                           - Emerging evidence supports eating a diet of  fruits, vegetables, grains, calcium, and yogurt                            while reducing red meat and alcohol may reduce the                            risk of colon cancer.                            - Thank you for allowing me to be involved in your                            colon cancer prevention. Thornton Park MD, MD 09/08/2020 9:55:10 AM This report has been signed electronically.

## 2020-09-08 NOTE — Progress Notes (Signed)
BP 163/122, Labetalol given IV, MD update, vss

## 2020-09-08 NOTE — Progress Notes (Signed)
Pt. Reports no change in his medical or surgical history since his pre-visit 08/25/2020.

## 2020-09-08 NOTE — Progress Notes (Signed)
Report given to PACU, vss 

## 2020-09-12 ENCOUNTER — Telehealth: Payer: Self-pay

## 2020-09-12 NOTE — Telephone Encounter (Signed)
  Follow up Call-  Call back number 09/08/2020  Post procedure Call Back phone  # [phone]  Permission to leave phone message Yes  Some recent data might be hidden     Patient questions:  Do you have a fever, pain , or abdominal swelling? No. Pain Score  0 *  Have you tolerated food without any problems? Yes.    Have you been able to return to your normal activities? Yes.    Do you have any questions about your discharge instructions: Diet   No. Medications  No. Follow up visit  No.  Do you have questions or concerns about your Care? No.  Actions: * If pain score is 4 or above: No action needed, pain <4.

## 2020-09-17 ENCOUNTER — Other Ambulatory Visit: Payer: Self-pay | Admitting: Critical Care Medicine

## 2020-09-17 NOTE — Telephone Encounter (Signed)
Requested Prescriptions  Pending Prescriptions Disp Refills  . metFORMIN (GLUCOPHAGE-XR) 500 MG 24 hr tablet [Pharmacy Med Name: METFORMIN HCL ER 500 MG TABLET] 120 tablet 0    Sig: TAKE 2 TABLETS (1,000 MG TOTAL) BY MOUTH IN THE MORNING AND AT BEDTIME.     Endocrinology:  Diabetes - Biguanides Failed - 09/17/2020 10:23 AM      Failed - HBA1C is between 0 and 7.9 and within 180 days    HbA1c, POC (controlled diabetic range)  Date Value Ref Range Status  06/13/2020 8.6 (A) 0.0 - 7.0 % Final         Passed - Cr in normal range and within 360 days    Creatinine  Date Value Ref Range Status  08/11/2011 1.06 0.60 - 1.30 mg/dL Final   Creatinine, Ser  Date Value Ref Range Status  06/13/2020 1.16 0.76 - 1.27 mg/dL Final         Passed - AA eGFR in normal range and within 360 days    EGFR (African American)  Date Value Ref Range Status  08/11/2011 >60  Final   GFR calc Af Amer  Date Value Ref Range Status  11/22/2019 119 >59 mL/min/1.73 Final    Comment:    **[facility] currently reports eGFR in compliance with the current**   recommendations of the [employer]. [facility] will   update reporting as new guidelines are published from the NKF-ASN   Task force.    EGFR (Non-African Amer.)  Date Value Ref Range Status  08/11/2011 >60  Final    Comment:    eGFR values <63m/min/1.73 m2 may be an indication of chronic kidney disease (CKD). Calculated eGFR is useful in patients with stable renal function. The eGFR calculation will not be reliable in acutely ill patients when serum creatinine is changing rapidly. It is not useful in  patients on dialysis. The eGFR calculation may not be applicable to patients at the low and high extremes of body sizes, pregnant women, and vegetarians.    GFR calc non Af Amer  Date Value Ref Range Status  11/22/2019 103 >59 mL/min/1.73 Final   eGFR  Date Value Ref Range Status  06/13/2020 79 >59 mL/min/1.73 Final         Passed -  Valid encounter within last 6 months    Recent Outpatient Visits          1 month ago Type 2 diabetes mellitus without complication, without long-term current use of insulin ([facility]   [place] S[place]nie Main[place], RPH-CPP   2 months ago Type 2 diabetes mellitus without complication, without long-term current use of insulin ([facility]   [place] S[place]nie Main[place], RPH-CPP   3 months ago Type 2 diabetes mellitus without complication, without long-term current use of insulin ([facility]   [place]   4 months ago Type 2 diabetes mellitus without complication, without long-term current use of insulin ([facility]   [place] St[place]   6 months ago Type 2 diabetes mellitus without complication, without long-term current use of insulin ([facility]   [place] RPH-CPP      Future Appointments            In 3 days WJoya GaskinsPBurnett Harry MD [place]

## 2020-09-19 ENCOUNTER — Encounter: Payer: Self-pay | Admitting: Gastroenterology

## 2020-09-20 ENCOUNTER — Other Ambulatory Visit: Payer: Self-pay

## 2020-09-20 ENCOUNTER — Encounter: Payer: Self-pay | Admitting: Critical Care Medicine

## 2020-09-20 ENCOUNTER — Ambulatory Visit: Payer: BC Managed Care – PPO | Attending: Critical Care Medicine | Admitting: Critical Care Medicine

## 2020-09-20 DIAGNOSIS — Z6841 Body Mass Index (BMI) 40.0 and over, adult: Secondary | ICD-10-CM

## 2020-09-20 DIAGNOSIS — E119 Type 2 diabetes mellitus without complications: Secondary | ICD-10-CM

## 2020-09-20 DIAGNOSIS — I10 Essential (primary) hypertension: Secondary | ICD-10-CM

## 2020-09-20 DIAGNOSIS — K635 Polyp of colon: Secondary | ICD-10-CM

## 2020-09-20 MED ORDER — EMPAGLIFLOZIN 10 MG PO TABS: 10.0000 mg | ORAL_TABLET | Freq: Every day | ORAL | 3 refills | Status: DC

## 2020-09-20 MED ORDER — ATORVASTATIN CALCIUM 20 MG PO TABS: 20.0000 mg | ORAL_TABLET | Freq: Every day | ORAL | 1 refills | Status: DC

## 2020-09-20 MED ORDER — ACCU-CHEK GUIDE VI STRP: ORAL_STRIP | 12 refills | Status: DC

## 2020-09-20 MED ORDER — METFORMIN HCL ER 500 MG PO TB24: 1000.0000 mg | ORAL_TABLET | Freq: Two times a day (BID) | ORAL | 1 refills | Status: DC

## 2020-09-20 MED ORDER — ACCU-CHEK FASTCLIX LANCETS MISC: 1 refills | Status: DC

## 2020-09-20 MED ORDER — LOSARTAN POTASSIUM 100 MG PO TABS: 100.0000 mg | ORAL_TABLET | Freq: Every day | ORAL | 3 refills | Status: DC

## 2020-09-20 MED ORDER — TRULICITY 3 MG/0.5ML ~~LOC~~ SOAJ: 3.0000 mg | SUBCUTANEOUS | 2 refills | Status: DC

## 2020-09-20 MED ORDER — POTASSIUM CHLORIDE CRYS ER 20 MEQ PO TBCR: 20.0000 meq | EXTENDED_RELEASE_TABLET | Freq: Every day | ORAL | 3 refills | Status: DC

## 2020-09-20 MED ORDER — AMLODIPINE BESYLATE 10 MG PO TABS: 10.0000 mg | ORAL_TABLET | Freq: Every day | ORAL | 1 refills | Status: DC

## 2020-09-20 NOTE — Assessment & Plan Note (Signed)
Colonoscopy showed 2 small adenomas we will follow-up per GI in 3 years

## 2020-09-20 NOTE — Assessment & Plan Note (Signed)
Patient following a healthier diet also increasing exercise

## 2020-09-20 NOTE — Progress Notes (Signed)
Subjective:    Patient ID: Jason Bruce, male    DOB: 12-06-1975, 45 y.o.   MRN: [id number] Virtual Visit via Video Note  I connected with Jason Bruce 09/20/20 at 930AM by a video enabled telemedicine application and verified that I am speaking with the correct person using two identifiers.   Consent:  I discussed the limitations, risks, security and privacy concerns of performing an evaluation and management service by video visit and the availability of in person appointments. I also discussed with the patient that there may be a patient responsible charge related to this service. The patient expressed understanding and agreed to proceed.  Location of patient: The patient's at home  Location of provider: I am in the office  Persons participating in the televisit with the patient.    No one else on the call   History of Present Illness: 10/27/2019 This is a pleasant African-American male who was admitted for Covid viral infection with associated pneumonia in August of this year.  Below is a copy of the discharge summary.  This is a telephone visit as the patient cannot achieve a video access on his phone   11/22/2019 This patient was seen prior by way of a phone visit on 15 September comes in the office for direct exam.  Overall he is improved.  He is having less shortness of breath.  His blood sugars have been in the 200 range.  Blood pressure is lower.  On arrival blood pressure is good at 127/93.  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  He does maintain the Metformin and Tradjenta.  He has no symptoms referable to the COVID-19 infection.  01/04/2020 Patient returns in follow-up for COVID-19 viral pneumonia and hypoxic respiratory failure which is now totally resolved.  Patient has morbid obesity with hypertension type 2 diabetes.  Acute kidney injury had also resolved at the last visit with follow-up labs liver enzymes have normalized  as well  The patient on arrival today has a glucose of 295 hemoglobin A1c of 9.8 he had ran out of his Tradjenta over a month ago he is only taking his Metformin.  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  He is not very active.  He is not been compliant with the diet as well.  The patient has yet to obtain his Covid vaccine.  He has been encouraged to get this previously. No other complaints at this time  On arrival blood pressure is good at 124/80  04/24/20 This patient is seen by way of a telephone visit.  His phone did not except video.  This is a diabetic follow-up for this patient.  Prior history of severe Covid viral pneumonia in 2021.  He has resolved from the pneumonia.  He states his blood pressure when being measured at home is in the 120/80 range.  His blood glucoses have dropped to below 100s and he has lost another 23 pounds of weight.  He is taking Klor-Con potassium supplementation needs to have potassium levels rechecked.  His original A1c was 9.8 when he was admitted for viral pneumonia. Patient is yet to receive a COVID vaccine at this time.  Patient is compliant with his atorvastatin and he is maintaining Trulicity with an increased dose of 1.5 mg injected weekly.  Patient maintains the Metformin 1000 mg twice daily.  The patient also maintains losartan 100 mg amlodipine 10 mg for blood pressure.  There  are no other complaints at this time.  Note patient is uninsured at this time.  He does not wish to pursue a colonoscopy.  06/13/20 Patient seen return follow-up on arrival blood pressure 137/88 blood sugar 182 A1c 8.6  The patient has not had much weight loss he is not really exercising regularly does not follow a regular diabetic diet.  The patient is maintaining Trulicity and metformin.  The patient states his blood sugars range in the 1 80-200 range at home.  The patient is compliant with amlodipine and losartan for blood pressure.  The patient has yet to hear from  gastroenterology regarding a colon cancer screen.  Patient has no other complaints at this time.  He is determined he will not obtain the COVID-vaccine and knows the risk involved with this decision.  He previously had COVID-pneumonia a year ago and was at high risk for recurrence.  09/20/2020 's patient is seen today by way of a virtual video visit.  Since the last visit he has had a colonoscopy and 2 adenomatous polyps have been removed.  He states his blood sugars have dropped into the 100 range.  His weight is down 15 pounds and he is eating a healthier diet at this time.  He still has occasional left flank pain at the site of his previous staghorn renal calculus.  He denies dysuria or fever.  His breathing is stable and he has no other complaints at this time.  His blood pressures have been measured as well in the 140-150/80 range at home.  Patient remains compliant with metformin Jardiance and Trulicity.  He has no other complaints at this time.  He does work from home in a sedentary type position  Past Medical History:  Diagnosis Date   Acute hypoxemic respiratory failure due to COVID-19 ([place]) 09/30/2019   AKI (acute kidney injury) ([place])    Diabetes mellitus without complication ([place])    Hypertension    Nephrolithiasis    Pneumonia due to COVID-19 virus 09/30/2019     Family History  Problem Relation Age of Onset   Cancer Mother    Hypertension Other    Diabetes Other    Colon polyps Neg Hx    Colon cancer Neg Hx    Esophageal cancer Neg Hx    Rectal cancer Neg Hx    Stomach cancer Neg Hx      Social History   Socioeconomic History   Marital status: Single    Spouse name: Not on file   Number of children: Not on file   Years of education: Not on file   Highest education level: Not on file  Occupational History   Not on file  Tobacco Use   Smoking status: Never   Smokeless tobacco: Never  Vaping Use   Vaping Use: Never used  Substance and Sexual Activity   Alcohol  use: No   Drug use: No   Sexual activity: Not on file  Other Topics Concern   Not on file  Social History Narrative   Not on file   Social Determinants of Health   Financial Resource Strain: Not on file  Food Insecurity: Not on file  Transportation Needs: Not on file  Physical Activity: Not on file  Stress: Not on file  Social Connections: Not on file  Intimate Partner Violence: Not on file     Allergies  Allergen Reactions   Shellfish Allergy Anaphylaxis and Swelling     Outpatient Medications Prior to Visit  Medication Sig Dispense Refill   Blood Glucose Monitoring Suppl (ACCU-CHEK GUIDE) w/Device KIT Test blood sugar twice a day 1 kit 0   Lancets Misc. (ACCU-CHEK FASTCLIX LANCET) KIT Use to test blood sugar twice a day 1 kit 0   Multiple Vitamin (MULTIVITAMIN WITH MINERALS) TABS tablet Take 1 tablet by mouth daily.     Accu-Chek FastClix Lancets MISC Use to test blood sugar twice a day 100 each 1   amLODipine (NORVASC) 10 MG tablet Take 1 tablet (10 mg total) by mouth daily. 90 tablet 1   atorvastatin (LIPITOR) 40 MG tablet Take 1 tablet (40 mg total) by mouth daily. (Patient taking differently: Take 20 mg by mouth daily.) 90 tablet 3   Dulaglutide (TRULICITY) 3 WE/9.9BZ SOPN Inject 3 mg as directed once a week. 2 mL 2   empagliflozin (JARDIANCE) 10 MG TABS tablet Take 1 tablet (10 mg total) by mouth daily before breakfast. 30 tablet 3   glucose blood (ACCU-CHEK GUIDE) test strip Use as instructed 100 each 12   losartan (COZAAR) 100 MG tablet Take 1 tablet (100 mg total) by mouth daily. 90 tablet 3   metFORMIN (GLUCOPHAGE-XR) 500 MG 24 hr tablet TAKE 2 TABLETS (1,000 MG TOTAL) BY MOUTH IN THE MORNING AND AT BEDTIME. 120 tablet 0   potassium chloride SA (KLOR-CON) 20 MEQ tablet Take 1 tablet (20 mEq total) by mouth daily. 30 tablet 3   No facility-administered medications prior to visit.      Review of Systems  Constitutional: Negative.   HENT: Negative.    Eyes:  Negative.   Respiratory: Negative.    Cardiovascular: Negative.   Gastrointestinal: Negative.   Genitourinary:  Positive for flank pain. Negative for difficulty urinating, dysuria, frequency and hematuria.  Neurological: Negative.   Hematological: Negative.   Psychiatric/Behavioral: Negative.        Objective:   Physical Exam There were no vitals filed for this visit.   This is a video visit the patient is in no distress   BMP Latest Ref Rng & Units 06/13/2020 11/22/2019 10/03/2019  Glucose 65 - 99 mg/dL 183(H) 177(H) 178(H)  BUN 6 - 24 mg/dL 13 17 27(H)  Creatinine 0.76 - 1.27 mg/dL 1.16 0.89 1.34(H)  BUN/Creat Ratio 9 - '20 11 19 ' -  Sodium 134 - 144 mmol/L 141 139 136  Potassium 3.5 - 5.2 mmol/L 4.3 4.3 3.6  Chloride 96 - 106 mmol/L 102 101 99  CO2 20 - 29 mmol/L '20 24 30  ' Calcium 8.7 - 10.2 mg/dL 9.7 9.9 9.0   CBC Latest Ref Rng & Units 06/13/2020 11/22/2019 10/03/2019  WBC 3.4 - 10.8 x10E3/uL 8.5 7.8 10.1  Hemoglobin 13.0 - 17.7 g/dL 13.0 11.5(L) 11.8(L)  Hematocrit 37.5 - 51.0 % 41.9 33.7(L) 37.9(L)  Platelets 150 - 450 x10E3/uL 285 250 440(H)   Hepatic Function Latest Ref Rng & Units 06/13/2020 11/22/2019 10/03/2019  Total Protein 6.0 - 8.5 g/dL 7.9 7.6 7.0  Albumin 4.0 - 5.0 g/dL 4.7 4.6 3.3(L)  AST 0 - 40 IU/L 13 17 53(H)  ALT 0 - 44 IU/L 18 24 86(H)  Alk Phosphatase 44 - 121 IU/L 94 72 50  Total Bilirubin 0.0 - 1.2 mg/dL 0.4 <0.2 0.5            Assessment & Plan:  I personally reviewed all images and lab data in the [facility] system as well as any outside material available during this office visit and agree with the  radiology impressions.  Hypertension Continue current medications we will bring patient in for direct exam in September  DM (diabetes mellitus) ([place]) Diabetes appears to be under good control no change in medications  Colon polyps Colonoscopy showed 2 small adenomas we will follow-up per GI in 3 years  BMI 45.0-49.9, adult [facility]) Patient following  a healthier diet also increasing exercise   Diagnoses and all orders for this visit:  Primary hypertension  Type 2 diabetes mellitus without complication, without long-term current use of insulin (HCC)  Polyp of descending colon, unspecified type  BMI 45.0-49.9, adult ([place])  Other orders -     Accu-Chek FastClix Lancets MISC; Use to test blood sugar twice a day -     amLODipine (NORVASC) 10 MG tablet; Take 1 tablet (10 mg total) by mouth daily. -     atorvastatin (LIPITOR) 20 MG tablet; Take 1 tablet (20 mg total) by mouth daily. -     Dulaglutide (TRULICITY) 3 BZ/9.6DS SOPN; Inject 3 mg as directed once a week. -     empagliflozin (JARDIANCE) 10 MG TABS tablet; Take 1 tablet (10 mg total) by mouth daily before breakfast. -     glucose blood (ACCU-CHEK GUIDE) test strip; Use as instructed -     losartan (COZAAR) 100 MG tablet; Take 1 tablet (100 mg total) by mouth daily. -     metFORMIN (GLUCOPHAGE-XR) 500 MG 24 hr tablet; Take 2 tablets (1,000 mg total) by mouth in the morning and at bedtime. -     potassium chloride SA (KLOR-CON) 20 MEQ tablet; Take 1 tablet (20 mEq total) by mouth daily.    Follow Up Instructions: Patient knows a direct exam will occur in mid September    I discussed the assessment and treatment plan with the patient. The patient was provided an opportunity to ask questions and all were answered. The patient agreed with the plan and demonstrated an understanding of the instructions.   The patient was advised to call back or seek an in-person evaluation if the symptoms worsen or if the condition fails to improve as anticipated.  I provided 21 minutes of non-face-to-face time during this encounter  including  median intraservice time , review of notes, labs, imaging, medications  and explaining diagnosis and management to the patient .    Asencion Noble, MD

## 2020-09-20 NOTE — Assessment & Plan Note (Signed)
Diabetes appears to be under good control no change in medications

## 2020-09-20 NOTE — Assessment & Plan Note (Signed)
Continue current medications we will bring patient in for direct exam in September

## 2020-09-22 ENCOUNTER — Telehealth: Payer: Self-pay | Admitting: Critical Care Medicine

## 2020-09-22 NOTE — Telephone Encounter (Signed)
-----   Message from Elsie Stain, MD sent at 09/20/2020 10:01 AM EDT ----- This pt needs OV with me face to face early to mid Sept

## 2020-09-22 NOTE — Telephone Encounter (Signed)
Patient need to be scheduled for a face to face visit with Dr. Joya Gaskins Early to [facility] Sept. Called patient no answer and unable to leave vm. If patient returns call please get him scheduled or transfer to office.

## 2020-09-28 ENCOUNTER — Other Ambulatory Visit: Payer: Self-pay | Admitting: Critical Care Medicine

## 2020-09-29 NOTE — Telephone Encounter (Signed)
   Notes to clinic:  Product Backordered/Unavailable:PRODUCT ON BACKORDER   Requested Prescriptions  Pending Prescriptions Disp Refills   TRULICITY 3 0000000 SOPN [Pharmacy Med Name: TRULICITY 3 [id number] ML PEN]  2    Sig: Inject 3 mg as directed once a week.     Endocrinology:  Diabetes - GLP-1 Receptor Agonists Failed - 09/28/2020  8:10 PM      Failed - HBA1C is between 0 and 7.9 and within 180 days    HbA1c, POC (controlled diabetic range)  Date Value Ref Range Status  06/13/2020 8.6 (A) 0.0 - 7.0 % Final          Passed - Valid encounter within last 6 months    Recent Outpatient Visits           1 week ago Primary hypertension   [place] Elsie Stain, MD   2 months ago Type 2 diabetes mellitus without complication, without long-term current use of insulin [facility])   [place], Annie Main L, RPH-CPP   3 months ago Type 2 diabetes mellitus without complication, without long-term current use of insulin [facility])   [place], Annie Main L, RPH-CPP   3 months ago Type 2 diabetes mellitus without complication, without long-term current use of insulin [facility])   [place] Elsie Stain, MD   5 months ago Type 2 diabetes mellitus without complication, without long-term current use of insulin [facility])   [facility] Elsie Stain, MD

## 2020-10-04 NOTE — Telephone Encounter (Signed)
Dr Joya Gaskins refilled medication on 09/20/20 with 2 additional refills.

## 2021-05-02 ENCOUNTER — Telehealth: Payer: Self-pay | Admitting: *Deleted

## 2021-05-02 NOTE — Telephone Encounter (Signed)
He has not been seen since last summer  he will need to be seen by any provider before a GI referral can occur ?

## 2021-05-02 NOTE — Telephone Encounter (Signed)
pt needs a referral to a gasterologist, for swollen intestines. Appt is made for Maym23 with Dr Joya Gaskins. ?

## 2021-05-02 NOTE — Telephone Encounter (Signed)
fyi

## 2021-05-03 ENCOUNTER — Telehealth: Payer: Self-pay

## 2021-05-03 NOTE — Telephone Encounter (Signed)
Called pt but could leave vm about note  ?

## 2021-05-03 NOTE — Telephone Encounter (Signed)
Called pt but couldn't leave a vm, needs appointment for his GI referral. ?

## 2021-05-29 DIAGNOSIS — E1165 Type 2 diabetes mellitus with hyperglycemia: Secondary | ICD-10-CM | POA: Insufficient documentation

## 2021-05-29 DIAGNOSIS — Z87442 Personal history of urinary calculi: Secondary | ICD-10-CM | POA: Insufficient documentation

## 2021-05-29 DIAGNOSIS — R809 Proteinuria, unspecified: Secondary | ICD-10-CM | POA: Insufficient documentation

## 2021-05-29 DIAGNOSIS — K5720 Diverticulitis of large intestine with perforation and abscess without bleeding: Secondary | ICD-10-CM | POA: Insufficient documentation

## 2021-05-29 DIAGNOSIS — E119 Type 2 diabetes mellitus without complications: Secondary | ICD-10-CM | POA: Insufficient documentation

## 2021-07-05 ENCOUNTER — Ambulatory Visit: Payer: 59 | Attending: Critical Care Medicine | Admitting: Critical Care Medicine

## 2021-07-05 ENCOUNTER — Encounter: Payer: Self-pay | Admitting: Critical Care Medicine

## 2021-07-05 VITALS — BP 137/98 | HR 93 | Temp 98.5°F | Resp 20 | Ht 70.0 in | Wt 329.0 lb

## 2021-07-05 DIAGNOSIS — E1165 Type 2 diabetes mellitus with hyperglycemia: Secondary | ICD-10-CM | POA: Diagnosis not present

## 2021-07-05 DIAGNOSIS — Z6841 Body Mass Index (BMI) 40.0 and over, adult: Secondary | ICD-10-CM

## 2021-07-05 DIAGNOSIS — K5720 Diverticulitis of large intestine with perforation and abscess without bleeding: Secondary | ICD-10-CM

## 2021-07-05 DIAGNOSIS — I10 Essential (primary) hypertension: Secondary | ICD-10-CM

## 2021-07-05 DIAGNOSIS — G4733 Obstructive sleep apnea (adult) (pediatric): Secondary | ICD-10-CM | POA: Insufficient documentation

## 2021-07-05 DIAGNOSIS — E11630 Type 2 diabetes mellitus with periodontal disease: Secondary | ICD-10-CM | POA: Insufficient documentation

## 2021-07-05 LAB — POCT GLYCOSYLATED HEMOGLOBIN (HGB A1C): HbA1c, POC (controlled diabetic range): 11.1 % — AB (ref 0.0–7.0)

## 2021-07-05 LAB — POCT CBG (FASTING - GLUCOSE)-MANUAL ENTRY: Glucose Fasting, POC: 188 mg/dL — AB (ref 70–99)

## 2021-07-05 MED ORDER — ACCU-CHEK GUIDE VI STRP: ORAL_STRIP | 12 refills | Status: DC

## 2021-07-05 MED ORDER — CARVEDILOL 12.5 MG PO TABS: 12.5000 mg | ORAL_TABLET | Freq: Two times a day (BID) | ORAL | 3 refills | Status: DC

## 2021-07-05 MED ORDER — EMPAGLIFLOZIN 10 MG PO TABS: 10.0000 mg | ORAL_TABLET | Freq: Every day | ORAL | 3 refills | Status: DC

## 2021-07-05 MED ORDER — BD PEN NEEDLE ORIGINAL U/F 29G X 12.7MM MISC: 2 refills | Status: DC

## 2021-07-05 MED ORDER — HYDRALAZINE HCL 100 MG PO TABS: 100.0000 mg | ORAL_TABLET | Freq: Three times a day (TID) | ORAL | 2 refills | Status: DC

## 2021-07-05 MED ORDER — ACCU-CHEK FASTCLIX LANCET KIT: PACK | 0 refills | Status: DC

## 2021-07-05 MED ORDER — LANTUS SOLOSTAR 100 UNIT/ML ~~LOC~~ SOPN: 45.0000 [IU] | PEN_INJECTOR | Freq: Every day | SUBCUTANEOUS | 4 refills | Status: DC

## 2021-07-05 MED ORDER — TRULICITY 3 MG/0.5ML ~~LOC~~ SOAJ: 3.0000 mg | SUBCUTANEOUS | 2 refills | Status: DC

## 2021-07-05 MED ORDER — AMLODIPINE BESYLATE 10 MG PO TABS: 10.0000 mg | ORAL_TABLET | Freq: Every day | ORAL | 1 refills | Status: DC

## 2021-07-05 MED ORDER — METFORMIN HCL ER 500 MG PO TB24: 1000.0000 mg | ORAL_TABLET | Freq: Two times a day (BID) | ORAL | 1 refills | Status: DC

## 2021-07-05 MED ORDER — ATORVASTATIN CALCIUM 20 MG PO TABS: 20.0000 mg | ORAL_TABLET | Freq: Every day | ORAL | 1 refills | Status: DC

## 2021-07-05 MED ORDER — VALSARTAN 160 MG PO TABS: 160.0000 mg | ORAL_TABLET | Freq: Every day | ORAL | 3 refills | Status: DC

## 2021-07-05 NOTE — Assessment & Plan Note (Signed)
Perforation has resolved he does have diverticulosis he will have to avoid nuts and seeds and was given a fiber diet

## 2021-07-05 NOTE — Progress Notes (Signed)
F/U Diabetes  CBG-188 A1C-11.1

## 2021-07-05 NOTE — Assessment & Plan Note (Signed)
Recommend dental exam

## 2021-07-05 NOTE — Progress Notes (Addendum)
Established Patient Office Visit  Subjective   Patient ID: Jason Bruce, male    DOB: May 15, 1975  Age: 46 y.o. MRN: [id number]  Chief Complaint  Patient presents with   Diabetes    This is a pleasant 46 year old male with type 2 diabetes hypertension and now history of diverticulitis with microperforation of the colon in April of this year.  Below is a copy of the discharge summary.  During this hospitalization it was discerned that he has sleep apnea and has a follow-up sleep medicine appointment in July.  The patient on arrival has blood pressure 137/98 and A1c of 11 with blood sugar 188.  Patient supposed to be on Trulicity and Jardiance but has not not had this filled in some time.  He is now on Lantus 40 units daily and metformin 1000 mg twice daily  He cannot tell me any of his blood sugar numbers from home or his blood pressure numbers as he does have a home blood pressure meter.  Currently the patient is only taking amlodipine 10 mg daily and metoprolol 50 mg daily for blood pressure.  The patient did have a follow-up visit with gastroenterology and colorectal surgery they said his colitis has improved he is off all antibiotics.  Patient's had no difficulties with his abdomen since her last visit with the colorectal surgeon who has released him.    Admit date: 05/29/2021  Discharge date and time: 06/04/2021  Admitting Physician: Val Riles, MD  Discharge Physician: Renelda Mom, MD  Admission Diagnoses: Diverticulitis with microperforation  Discharge Diagnoses:  Principal Problem: Diverticulitis of colon with perforation Active Problems: Uncontrolled hypertension History of kidney stones Essential hypertension Uncontrolled type 2 diabetes mellitus with hyperglycemia ([place]) Resolved Problems: Nausea with vomiting  Indication for Admission: Diverticulitis with microperforation  History of present illness taken from history and physical dictated 4/18: Jason Bruce is a 46 y.o. male with a pmhx of DM II, HTN, HLD, kidney stones that presented to [facility] ED for complaints of abdominal pain that started Saturday evening and vomiting that started this morning. The patient was recently seen in the ED 04/26/2021 and diagnosed with simple diverticulitis and prescribed ciprofloxacin and metronidazole for 10 days and a referral was placed to GI. He states that he was feeling better so he stopped taking them. He did follow up with the GI physician who told him what foods to avoid with active infection. The patient felt the infection was gone so Saturday he ate some popcorn. He started having LUQ cramping pain 3/10, his wife gave him the rest of the antibiotics he did not complete with the initial infection and tylenol, this did nothing for the pain. The pain continued until this morning where he began vomiting. Then he proceeded to the ED for evaluation. Of note the patient is a diabetic has hx of HTN and has hx of kidney stones , he admits to not taking his blood pressure medications for days and not taking his diabetic medications for some time because of the way it makes him feel. The patient was educated on need for compliance with medication regimen as he is at risk for heart attack, stroke , ESRD, loss of digits or limb Denies fevers, chills night sweats, Shortness of breath , diarrhea, patient has been nauseated with episodes of non bloody bilious vomiting   ED course: Patient arrived to the ED with vitals as follows: T 57F,P 111, RR 18, BP 193/133, SPO2 97% on room air. CBC  with diff unremarkable, CMP Na 133, glucose 361. CT abd pelvis wo contrast IMPRESSION: Mild improvement in proximal sigmoid diverticulitis, with microperforation. No evidence of abscess. Bilateral nephrolithiasis. No evidence of ureteral calculi or hydronephrosis. Stable hepatic steatosis.   Hospital Course:  1.  Acute diverticulitis with microperforation - greatly appreciate input from surgical  team. Surgical team notes that with microperforation this is a complicated diverticulitis and we are hopeful that patient will be able to have adequate healing with antibiotic treatment only.  Unfortunately, if adequate healing is not noted, patient may require surgical intervention.  Patient will complete antibiotic course and follow-up with colorectal surgery on an outpatient basis for repeat imaging and consideration of surgical invention if necessary   2.  Hypertensive urgency - blood pressure as high as 229/142 upon arrival in emergency department.  During hospitalization, antihypertensive medications were adjusted and during the last 24 hours, patient's blood pressure ranged from 131/68-153/99. Continue current medication regimen. We will defer to primary care doctor for continued titration of antihypertensive medications   3.  Uncontrolled diabetes mellitus with hyperglycemia - hemoglobin A1c updated at time of admission and is 14.1.  With adjustments to medication regimen, patient's blood glucose ranging from 149-221 in the last 24 hours. Compliance with diabetic diet has been emphasized   4.  Hyperlipidemia - continue statin   5.  Suspected obstructive sleep apnea - patient had discussed with surgical team that he has frequent awakenings during the night and notes that his wife has commented about loud snoring.  Patient was agreeable to try CPAP at nighttime.  Following initiation of CPAP, patient reports that he feels that he is able to sleep better and it has improved his breathing.  He would likely benefit from CPAP for home use.  We will arrange for sleep study following discharge from the hospital   6. Morbid /Severe Obesity - BMI 49, morbid obesity complicates all facets of patient care   7.  Bilateral nephrolithiasis - no evidence of ureteral calculi nor hydronephrosis.    Patient's Ordered Code Status: Full Code  Consults:  Orders Placed This Encounter  Procedures   Consult To  Surgery (General)   Significant Diagnostic Studies:  CT abdomen/pelvis 4/18: Mild improvement in proximal sigmoid diverticulitis, with  microperforation. No evidence of abscess.   Bilateral nephrolithiasis. No evidence of ureteral calculi or  hydronephrosis.   Stable hepatic steatosis.        Review of Systems  Constitutional:  Negative for chills, diaphoresis, fever, malaise/fatigue and weight loss.  HENT:  Negative for congestion, ear discharge, ear pain, hearing loss, nosebleeds, sore throat and tinnitus.   Eyes:  Negative for blurred vision, double vision, photophobia and discharge.  Respiratory:  Negative for cough, hemoptysis, sputum production, shortness of breath, wheezing and stridor.        No excess mucus  Cardiovascular:  Negative for chest pain, palpitations, orthopnea, claudication, leg swelling and PND.  Gastrointestinal:  Negative for abdominal pain, blood in stool, constipation, diarrhea, heartburn, melena, nausea and vomiting.  Genitourinary:  Negative for dysuria, flank pain, frequency, hematuria and urgency.  Musculoskeletal:  Negative for back pain, falls, joint pain, myalgias and neck pain.  Skin:  Negative for itching and rash.  Neurological:  Negative for dizziness, tingling, tremors, sensory change, speech change, focal weakness, seizures, loss of consciousness, weakness and headaches.  Endo/Heme/Allergies:  Negative for environmental allergies and polydipsia. Does not bruise/bleed easily.  Psychiatric/Behavioral:  Negative for depression, hallucinations, memory loss, substance abuse and suicidal  ideas. The patient is not nervous/anxious and does not have insomnia.   All other systems reviewed and are negative.    Objective:     BP (!) 137/98 (BP Location: Left Arm, Patient Position: Sitting, Cuff Size: Large)   Pulse 93   Temp 98.5 F (36.9 C) (Oral)   Resp 20   Ht '5\' 10"'  (1.778 m)   Wt (!) 329 lb (149.2 kg)   SpO2 96%   BMI 47.21 kg/m     Physical Exam Vitals reviewed.  Constitutional:      Appearance: Normal appearance. He is well-developed. He is obese. He is not diaphoretic.  HENT:     Head: Normocephalic and atraumatic.     Nose: No nasal deformity, septal deviation, mucosal edema or rhinorrhea.     Right Sinus: No maxillary sinus tenderness or frontal sinus tenderness.     Left Sinus: No maxillary sinus tenderness or frontal sinus tenderness.     Mouth/Throat:     Pharynx: No oropharyngeal exudate.     Comments: Mild periodontal disease Eyes:     General: No scleral icterus.    Conjunctiva/sclera: Conjunctivae normal.     Pupils: Pupils are equal, round, and reactive to light.  Neck:     Thyroid: No thyromegaly.     Vascular: No carotid bruit or JVD.     Trachea: Trachea normal. No tracheal tenderness or tracheal deviation.  Cardiovascular:     Rate and Rhythm: Normal rate and regular rhythm.     Chest Wall: PMI is not displaced.     Pulses: Normal pulses. No decreased pulses.     Heart sounds: Normal heart sounds, S1 normal and S2 normal. Heart sounds not distant. No murmur heard. No systolic murmur is present.  No diastolic murmur is present.    No friction rub. No gallop. No S3 or S4 sounds.  Pulmonary:     Effort: Pulmonary effort is normal. No tachypnea, accessory muscle usage or respiratory distress.     Breath sounds: Normal breath sounds. No stridor. No decreased breath sounds, wheezing, rhonchi or rales.  Chest:     Chest wall: No tenderness.  Abdominal:     General: Bowel sounds are normal. There is no distension.     Palpations: Abdomen is soft. Abdomen is not rigid.     Tenderness: There is no abdominal tenderness. There is no guarding or rebound.  Musculoskeletal:        General: Normal range of motion.     Cervical back: Normal range of motion and neck supple. No edema, erythema or rigidity. No muscular tenderness. Normal range of motion.     Comments: Foot exam is normal   Lymphadenopathy:     Head:     Right side of head: No submental or submandibular adenopathy.     Left side of head: No submental or submandibular adenopathy.     Cervical: No cervical adenopathy.  Skin:    General: Skin is warm and dry.     Coloration: Skin is not pale.     Findings: No rash.     Nails: There is no clubbing.  Neurological:     Mental Status: He is alert and oriented to person, place, and time.     Sensory: No sensory deficit.  Psychiatric:        Mood and Affect: Mood normal.        Speech: Speech normal.        Behavior: Behavior normal.  Thought Content: Thought content normal.     Results for orders placed or performed in visit on 07/05/21  HgB A1c  Result Value Ref Range   Hemoglobin A1C     HbA1c POC (<> result, manual entry)     HbA1c, POC (prediabetic range)     HbA1c, POC (controlled diabetic range) 11.1 (A) 0.0 - 7.0 %  Glucose (CBG), Fasting  Result Value Ref Range   Glucose Fasting, POC 188 (A) 70 - 99 mg/dL      The 10-year ASCVD risk score (Arnett DK, et al., 2019) is: 16.1%    Assessment & Plan:   Problem List Items Addressed This Visit       Cardiovascular and Mediastinum   Hypertension    Hypertension not well controlled plan to begin carvedilol twice daily and discontinue metoprolol, continue amlodipine, continue hydralazine, and begin valsartan   Patient given a lifestyle medicine handout and we went over with the patient dietary measures note he cannot use seeds or nuts because of the diverticulosis  The following Lifestyle Medicine recommendations according to [place] of Lifestyle Medicine [facility]) were discussed and offered to patient who agrees to start the journey:  A. Whole Foods, Plant-based plate comprising of fruits and vegetables, plant-based proteins, whole-grain carbohydrates was discussed in detail with the patient.   A list for source of those nutrients were also provided to the patient.  Patient will  use only water or unsweetened tea for hydration. B.  The need to stay away from risky substances including alcohol, smoking; obtaining 7 to 9 hours of restorative sleep, at least 150 minutes of moderate intensity exercise weekly, the importance of healthy social connections,  and stress reduction techniques were discussed.        Relevant Medications   amLODipine (NORVASC) 10 MG tablet   atorvastatin (LIPITOR) 20 MG tablet   hydrALAZINE (APRESOLINE) 100 MG tablet   valsartan (DIOVAN) 160 MG tablet   carvedilol (COREG) 12.5 MG tablet   Other Relevant Orders   CBC with Differential/Platelet     Respiratory   OSA (obstructive sleep apnea)    Diagnosed in last hospitalization recommend to keep sleep medicine appointment July         Digestive   Diverticulitis of colon with perforation    Perforation has resolved he does have diverticulosis he will have to avoid nuts and seeds and was given a fiber diet       Periodontal disease due to type 2 diabetes mellitus (HCC)    Recommend dental exam       Relevant Medications   atorvastatin (LIPITOR) 20 MG tablet   Dulaglutide (TRULICITY) 3 YM/4.1RA SOPN   empagliflozin (JARDIANCE) 10 MG TABS tablet   insulin glargine (LANTUS SOLOSTAR) 100 UNIT/ML Solostar Pen   valsartan (DIOVAN) 160 MG tablet   metFORMIN (GLUCOPHAGE-XR) 500 MG 24 hr tablet     Endocrine   Uncontrolled type 2 diabetes mellitus with hyperglycemia (HCC) - Primary    Hyperglycemia not controlled A1c is 11  Plan to resume Trulicity 3 mg weekly Resume Jardiance 10 mg daily Increase insulin glargine 45 units daily Continue metformin at 1000 mg twice daily  See hypertension assessment for ACL M intervention  See clinical pharmacy 3 weeks return to Dr. Joya Gaskins 2 months  Patient told to exercise by walking 30 minutes 5 times a week  Recommended patient get a dental and eye exam       Relevant Medications   atorvastatin (LIPITOR) 20 MG  tablet   Dulaglutide  (TRULICITY) 3 VX/9.4ZW SOPN   empagliflozin (JARDIANCE) 10 MG TABS tablet   insulin glargine (LANTUS SOLOSTAR) 100 UNIT/ML Solostar Pen   valsartan (DIOVAN) 160 MG tablet   metFORMIN (GLUCOPHAGE-XR) 500 MG 24 hr tablet   Other Relevant Orders   Urine microalbumin-creatinine with uACR   Comprehensive metabolic panel with eGFR (exluding QUEST, no eGFR)   HgB A1c (Completed)   Glucose (CBG), Fasting (Completed)     Other   BMI 45.0-49.9, adult (HCC)    As per diabetes and hypertension assessments       Relevant Medications   Dulaglutide (TRULICITY) 3 RK/4.7TV SOPN   empagliflozin (JARDIANCE) 10 MG TABS tablet   insulin glargine (LANTUS SOLOSTAR) 100 UNIT/ML Solostar Pen   metFORMIN (GLUCOPHAGE-XR) 500 MG 24 hr tablet   45 minutes spent assessing patient performing history and physical reviewing old records complex decision making is high extra time spent with patient education Return in about 2 months (around 09/04/2021).    Asencion Noble, MD

## 2021-07-05 NOTE — Patient Instructions (Signed)
Stop losartan begin valsartan 1 pill daily  Resume Trulicity 1 injection weekly  Resume Jardiance 1 pill daily  Stay on Lantus but increase to 45 units daily  Stay on metformin twice daily  Stop metoprolol and begin carvedilol twice daily  Get an eye exam and a dental exam  Follow-up with your sleep physician this July for your sleep apnea get the sleep study done  Use the lifestyle medicine handout and change her diet  Complete screening labs will be obtained today  Return to see [facility] clinical pharmacy 3 weeks Dr. Joya Gaskins 2 months

## 2021-07-05 NOTE — Assessment & Plan Note (Addendum)
Hyperglycemia not controlled A1c is 11  Plan to resume Trulicity 3 mg weekly Resume Jardiance 10 mg daily Increase insulin glargine 45 units daily Continue metformin at 1000 mg twice daily  See hypertension assessment for ACL M intervention  See clinical pharmacy 3 weeks return to Dr. Joya Gaskins 2 months  Patient told to exercise by walking 30 minutes 5 times a week  Recommended patient get a dental and eye exam

## 2021-07-05 NOTE — Assessment & Plan Note (Signed)
Hypertension not well controlled plan to begin carvedilol twice daily and discontinue metoprolol, continue amlodipine, continue hydralazine, and begin valsartan   Patient given a lifestyle medicine handout and we went over with the patient dietary measures note he cannot use seeds or nuts because of the diverticulosis  The following Lifestyle Medicine recommendations according to [place] of Lifestyle Medicine [facility]) were discussed and offered to patient who agrees to start the journey:  A. Whole Foods, Plant-based plate comprising of fruits and vegetables, plant-based proteins, whole-grain carbohydrates was discussed in detail with the patient.   A list for source of those nutrients were also provided to the patient.  Patient will use only water or unsweetened tea for hydration. B.  The need to stay away from risky substances including alcohol, smoking; obtaining 7 to 9 hours of restorative sleep, at least 150 minutes of moderate intensity exercise weekly, the importance of healthy social connections,  and stress reduction techniques were discussed.

## 2021-07-05 NOTE — Assessment & Plan Note (Signed)
As per diabetes and hypertension assessments

## 2021-07-05 NOTE — Assessment & Plan Note (Signed)
Diagnosed in last hospitalization recommend to keep sleep medicine appointment July

## 2021-07-06 LAB — CBC WITH DIFFERENTIAL/PLATELET
Basophils Absolute: 0 10*3/uL (ref 0.0–0.2)
Basos: 1 %
EOS (ABSOLUTE): 0.2 10*3/uL (ref 0.0–0.4)
Eos: 3 %
Hematocrit: 41.2 % (ref 37.5–51.0)
Hemoglobin: 13.3 g/dL (ref 13.0–17.7)
Immature Grans (Abs): 0 10*3/uL (ref 0.0–0.1)
Immature Granulocytes: 0 %
Lymphocytes Absolute: 1.7 10*3/uL (ref 0.7–3.1)
Lymphs: 28 %
MCH: 27.5 pg (ref 26.6–33.0)
MCHC: 32.3 g/dL (ref 31.5–35.7)
MCV: 85 fL (ref 79–97)
Monocytes Absolute: 0.5 10*3/uL (ref 0.1–0.9)
Monocytes: 8 %
Neutrophils Absolute: 3.6 10*3/uL (ref 1.4–7.0)
Neutrophils: 60 %
Platelets: 278 10*3/uL (ref 150–450)
RBC: 4.83 x10E6/uL (ref 4.14–5.80)
RDW: 13.9 % (ref 11.6–15.4)
WBC: 6 10*3/uL (ref 3.4–10.8)

## 2021-07-06 LAB — MICROALBUMIN / CREATININE URINE RATIO
Creatinine, Urine: 172 mg/dL
Microalb/Creat Ratio: 135 mg/g creat — ABNORMAL HIGH (ref 0–29)
Microalbumin, Urine: 232.5 ug/mL

## 2021-07-06 LAB — COMPREHENSIVE METABOLIC PANEL
ALT: 19 IU/L (ref 0–44)
AST: 13 IU/L (ref 0–40)
Albumin/Globulin Ratio: 1.6 (ref 1.2–2.2)
Albumin: 4.9 g/dL (ref 4.0–5.0)
Alkaline Phosphatase: 70 IU/L (ref 44–121)
BUN/Creatinine Ratio: 16 (ref 9–20)
BUN: 16 mg/dL (ref 6–24)
Bilirubin Total: 0.3 mg/dL (ref 0.0–1.2)
CO2: 22 mmol/L (ref 20–29)
Calcium: 9.6 mg/dL (ref 8.7–10.2)
Chloride: 99 mmol/L (ref 96–106)
Creatinine, Ser: 0.97 mg/dL (ref 0.76–1.27)
Globulin, Total: 3.1 g/dL (ref 1.5–4.5)
Glucose: 185 mg/dL — ABNORMAL HIGH (ref 70–99)
Potassium: 4.1 mmol/L (ref 3.5–5.2)
Sodium: 138 mmol/L (ref 134–144)
Total Protein: 8 g/dL (ref 6.0–8.5)
eGFR: 97 mL/min/{1.73_m2} (ref >59)

## 2021-07-10 ENCOUNTER — Telehealth: Payer: Self-pay

## 2021-07-10 NOTE — Telephone Encounter (Signed)
Pt was called and is aware of results, DOB was confirmed.  ?

## 2021-07-10 NOTE — Telephone Encounter (Signed)
-----   Message from Elsie Stain, MD sent at 07/09/2021  7:44 AM EDT ----- Let pt know he has mild protein in urine ,will improve with improving diabetes control, kidney liver blood counts normal

## 2021-07-17 ENCOUNTER — Telehealth: Payer: Self-pay | Admitting: Critical Care Medicine

## 2021-07-17 ENCOUNTER — Ambulatory Visit: Payer: Self-pay | Admitting: *Deleted

## 2021-07-17 NOTE — Telephone Encounter (Signed)
Summary: rx concern   The patient would like to be contacted by a member of clinical staff when possible   The patient has disorganized their medications   The patient would like to know which medications they're supposed to take in the day time as well as the evening   The patient would like to confirm all of the active medications on their chart that they're supposed to be taking and make sure that they're correct   Please contact further when possible      Reason for Disposition  Caller has medicine question only, adult not sick, AND triager answers question  Answer Assessment - Initial Assessment Questions 1. NAME of MEDICATION: "What medicine are you calling about?"     Medication list- request review  2. QUESTION: "What is your question?" (e.g., double dose of medicine, side effect)     Patient lost medication list from provider and is not sure when to take medications  3. PRESCRIBING HCP: "Who prescribed it?" Reason: if prescribed by specialist, call should be referred to that group.     PCP  Call to patient - reviewed medication list- once daily medication in am- except statin at night and other medication with specific dosing reviewed. Patient voiced understanding. Patient sent [place] so he will have access to his list  Protocols used: Medication Question Call-A-AH

## 2021-07-17 NOTE — Telephone Encounter (Unsigned)
Copied from [place] [id number]. Topic: General - Other >> Jul 17, 2021  8:43 AM Jason Bruce A wrote: Reason for CRM: The patient would like to be contacted by a member of clinical staff when possible   The patient would like to know which medications they're supposed to take in the day time as well as the evening   The patient would like to confirm all of the active medications on their chart that they're supposed to be taking and make sure that they're correct   Please contact further when possible

## 2021-08-10 ENCOUNTER — Ambulatory Visit: Payer: 59 | Attending: Critical Care Medicine | Admitting: Pharmacist

## 2021-08-10 VITALS — BP 127/84 | HR 96

## 2021-08-10 DIAGNOSIS — I10 Essential (primary) hypertension: Secondary | ICD-10-CM

## 2021-08-10 DIAGNOSIS — E1165 Type 2 diabetes mellitus with hyperglycemia: Secondary | ICD-10-CM | POA: Diagnosis not present

## 2021-08-10 NOTE — Progress Notes (Signed)
S:    PCP: Dr. Joya Gaskins  No chief complaint on file.  Patient arrives well and in good spirits. Presents for diabetes evaluation, education, and management. Patient was referred and last seen by Primary Care Provider, Dr. Joya Gaskins, on 07/05/2021. He reported at that visit not taking the Trulicity or Jardiance for some time. He also reported only taking amlodipine and metoprolol for BP. Dr. Joya Gaskins resumed his Trulicity and [place]. He also changed metoprolol to carvedilol. He continued hydralazine, amlodipine. Began valsartan.  Today, pt reports compliance with all medications. He denies hypoglycemia. He denies GI sx with Trulicity or metformin. He brings his glucometer and BP machine today for review.  Family/Social History: non-smoker, family history of hypertension and diabetes  Insurance coverage/medication affordability: [employer]   Medication adherence reported. Current diabetes medications include: Jardiance 10 mg daily, Lantus 45u daily,  metformin 5638 mg BID, Trulicity 3 mg weekly Current hypertension medications include: amlodipine 10 mg daily, carvedilol 12.5 mg BID, hydralazine 100 mg TID, valsartan 160 mg daily Current hyperlipidemia medications include: atorvastatin 20 mg daily   Patient denies hypoglycemic events.  Patient reported dietary habits: Eats 3 meals/day - Pt reports drastic dietary changes since last visit with Dr. Joya Gaskins.  - Has eliminated pork, red meat. Does not bread proteins. Has increased his intake of fish, salmon. - Has cut back on pastas and potatoes.    - Has eliminated sodas. Now drinks "Fit Soda" which is 0 sugar. Drinks mostly water and plant-based milk.  Patient-reported exercise habits:  - Not exercising consistently. Walks daily.    Patient denies nocturia (nighttime urination).  Patient denies neuropathy (nerve pain). Patient denies visual changes. Patient reports self foot exams.     O:   Lab Results  Component Value Date   HGBA1C 11.1  (A) 07/05/2021   Vitals:   08/10/21 1045  BP: 127/84  Pulse: 96   Home BP values:  - Brings wrist cuff which can be inaccurate.  - SBPs range from 110s-160s - DBPs  range from 70s - 90s - Hard to form trends. Took with his cuff after ours in the same arm and got 147/100.  Lipid Panel     Component Value Date/Time   CHOL 208 (H) 02/01/2020 1136   TRIG 139 02/01/2020 1136   HDL 48 02/01/2020 1136   CHOLHDL 4.3 02/01/2020 1136   LDLCALC 135 (H) 02/01/2020 1136    Clinical Atherosclerotic Cardiovascular Disease (ASCVD): No  The 10-year ASCVD risk score (Arnett DK, et al., 2019) is: 14.1%   Values used to calculate the score:     Age: 46 years     Sex: Male     Is Non-Hispanic African American: Yes     Diabetic: Yes     Tobacco smoker: No     Systolic Blood Pressure: 937 mmHg     Is BP treated: Yes     HDL Cholesterol: 48 mg/dL     Total Cholesterol: 208 mg/dL   Home CBGs:  - Brings his Accu Chek meter today.  - 7 day avg: 98 - 14 day avg: 106 - 30 day avg: 105 - 90 day avg: 192  A/P: Diabetes longstanding currently uncontrolled with recent A1c of 11.1%. His home glycemic control has improved drastically since last PCP visit. Patient is able to verbalize appropriate hypoglycemia management plan. Medication adherence appears to be improved. Dietary changes have also contributed to improved control. -Continue current regimen.  -Encouraged patient to pick-up and begin -Extensively discussed  pathophysiology of diabetes, recommended lifestyle interventions, dietary effects on blood sugar control -Counseled on s/sx of and management of hypoglycemia -Next A1C anticipated 09/2021.  HTN longstanding currently at goal. BP goal <130/80 mmHg. Medication adherence reported. -Continue current regimen.  Written patient instructions provided.  Total time in face to face counseling 30 minutes.   Follow up PCP Clinic Visit in 1 month.     Benard Halsted, PharmD, [place], [place] [phone]

## 2021-09-03 DIAGNOSIS — N390 Urinary tract infection, site not specified: Secondary | ICD-10-CM | POA: Insufficient documentation

## 2021-09-04 DIAGNOSIS — K5732 Diverticulitis of large intestine without perforation or abscess without bleeding: Secondary | ICD-10-CM

## 2021-09-04 DIAGNOSIS — R1030 Lower abdominal pain, unspecified: Secondary | ICD-10-CM | POA: Insufficient documentation

## 2021-09-04 DIAGNOSIS — A419 Sepsis, unspecified organism: Secondary | ICD-10-CM | POA: Insufficient documentation

## 2021-09-04 HISTORY — DX: Diverticulitis of large intestine without perforation or abscess without bleeding: K57.32

## 2021-09-04 NOTE — Progress Notes (Deleted)
Established Patient Office Visit  Subjective   Patient ID: Jason Bruce, male    DOB: August 20, 1975  Age: 46 y.o. MRN: [id number]  No chief complaint on file.   07/05/21 This is a pleasant 47 year old male with type 2 diabetes hypertension and now history of diverticulitis with microperforation of the colon in April of this year.  Below is a copy of the discharge summary.  During this hospitalization it was discerned that he has sleep apnea and has a follow-up sleep medicine appointment in July.  The patient on arrival has blood pressure 137/98 and A1c of 11 with blood sugar 188.  Patient supposed to be on Trulicity and Jardiance but has not not had this filled in some time.  He is now on Lantus 40 units daily and metformin 1000 mg twice daily  He cannot tell me any of his blood sugar numbers from home or his blood pressure numbers as he does have a home blood pressure meter.  Currently the patient is only taking amlodipine 10 mg daily and metoprolol 50 mg daily for blood pressure.  The patient did have a follow-up visit with gastroenterology and colorectal surgery they said his colitis has improved he is off all antibiotics.  Patient's had no difficulties with his abdomen since her last visit with the colorectal surgeon who has released him.    Admit date: 05/29/2021  Discharge date and time: 06/04/2021  Admitting Physician: Val Riles, MD  Discharge Physician: Renelda Mom, MD  Admission Diagnoses: Diverticulitis with microperforation  Discharge Diagnoses:  Principal Problem: Diverticulitis of colon with perforation Active Problems: Uncontrolled hypertension History of kidney stones Essential hypertension Uncontrolled type 2 diabetes mellitus with hyperglycemia ([place]) Resolved Problems: Nausea with vomiting  Indication for Admission: Diverticulitis with microperforation  History of present illness taken from history and physical dictated 4/18: Jason Bruce is a 46  y.o. male with a pmhx of DM II, HTN, HLD, kidney stones that presented to [facility] ED for complaints of abdominal pain that started Saturday evening and vomiting that started this morning. The patient was recently seen in the ED 04/26/2021 and diagnosed with simple diverticulitis and prescribed ciprofloxacin and metronidazole for 10 days and a referral was placed to GI. He states that he was feeling better so he stopped taking them. He did follow up with the GI physician who told him what foods to avoid with active infection. The patient felt the infection was gone so Saturday he ate some popcorn. He started having LUQ cramping pain 3/10, his wife gave him the rest of the antibiotics he did not complete with the initial infection and tylenol, this did nothing for the pain. The pain continued until this morning where he began vomiting. Then he proceeded to the ED for evaluation. Of note the patient is a diabetic has hx of HTN and has hx of kidney stones , he admits to not taking his blood pressure medications for days and not taking his diabetic medications for some time because of the way it makes him feel. The patient was educated on need for compliance with medication regimen as he is at risk for heart attack, stroke , ESRD, loss of digits or limb Denies fevers, chills night sweats, Shortness of breath , diarrhea, patient has been nauseated with episodes of non bloody bilious vomiting   ED course: Patient arrived to the ED with vitals as follows: T 39F,P 111, RR 18, BP 193/133, SPO2 97% on room air. CBC with diff unremarkable, CMP  Na 133, glucose 361. CT abd pelvis wo contrast IMPRESSION: Mild improvement in proximal sigmoid diverticulitis, with microperforation. No evidence of abscess. Bilateral nephrolithiasis. No evidence of ureteral calculi or hydronephrosis. Stable hepatic steatosis.   Hospital Course:  1.  Acute diverticulitis with microperforation - greatly appreciate input from surgical team. Surgical  team notes that with microperforation this is a complicated diverticulitis and we are hopeful that patient will be able to have adequate healing with antibiotic treatment only.  Unfortunately, if adequate healing is not noted, patient may require surgical intervention.  Patient will complete antibiotic course and follow-up with colorectal surgery on an outpatient basis for repeat imaging and consideration of surgical invention if necessary   2.  Hypertensive urgency - blood pressure as high as 229/142 upon arrival in emergency department.  During hospitalization, antihypertensive medications were adjusted and during the last 24 hours, patient's blood pressure ranged from 131/68-153/99. Continue current medication regimen. We will defer to primary care doctor for continued titration of antihypertensive medications   3.  Uncontrolled diabetes mellitus with hyperglycemia - hemoglobin A1c updated at time of admission and is 14.1.  With adjustments to medication regimen, patient's blood glucose ranging from 149-221 in the last 24 hours. Compliance with diabetic diet has been emphasized   4.  Hyperlipidemia - continue statin   5.  Suspected obstructive sleep apnea - patient had discussed with surgical team that he has frequent awakenings during the night and notes that his wife has commented about loud snoring.  Patient was agreeable to try CPAP at nighttime.  Following initiation of CPAP, patient reports that he feels that he is able to sleep better and it has improved his breathing.  He would likely benefit from CPAP for home use.  We will arrange for sleep study following discharge from the hospital   6. Morbid /Severe Obesity - BMI 49, morbid obesity complicates all facets of patient care   7.  Bilateral nephrolithiasis - no evidence of ureteral calculi nor hydronephrosis.    Patient's Ordered Code Status: Full Code  Consults:  Orders Placed This Encounter  Procedures   Consult To Surgery  (General)   Significant Diagnostic Studies:  CT abdomen/pelvis 4/18: Mild improvement in proximal sigmoid diverticulitis, with  microperforation. No evidence of abscess.   Bilateral nephrolithiasis. No evidence of ureteral calculi or  hydronephrosis.   Stable hepatic steatosis.    7/26 Hypertension  Hypertension not well controlled plan to begin carvedilol twice daily and discontinue metoprolol, continue amlodipine, continue hydralazine, and begin valsartan   Patient given a lifestyle medicine handout and we went over with the patient dietary measures note he cannot use seeds or nuts because of the diverticulosis  The following Lifestyle Medicine recommendations according to [place] of Lifestyle Medicine [facility]) were discussed and offered to patient who agrees to start the journey:  A. Whole Foods, Plant-based plate comprising of fruits and vegetables, plant-based proteins, whole-grain carbohydrates was discussed in detail with the patient.   A list for source of those nutrients were also provided to the patient.  Patient will use only water or unsweetened tea for hydration. B.  The need to stay away from risky substances including alcohol, smoking; obtaining 7 to 9 hours of restorative sleep, at least 150 minutes of moderate intensity exercise weekly, the importance of healthy social connections,  and stress reduction techniques were discussed.     Relevant Medications amLODipine (NORVASC) 10 MG tablet atorvastatin (LIPITOR) 20 MG tablet hydrALAZINE (APRESOLINE) 100 MG tablet valsartan (  DIOVAN) 160 MG tablet carvedilol (COREG) 12.5 MG tablet Other Relevant Orders CBC with Differential/Platelet Respiratory OSA (obstructive sleep apnea)  Diagnosed in last hospitalization recommend to keep sleep medicine appointment July    Digestive Diverticulitis of colon with perforation  Perforation has resolved he does have diverticulosis he will have to avoid nuts and seeds and  was given a fiber diet    Periodontal disease due to type 2 diabetes mellitus (HCC)  Recommend dental exam    Relevant Medications atorvastatin (LIPITOR) 20 MG tablet Dulaglutide (TRULICITY) 3 OJ/5.0KX SOPN empagliflozin (JARDIANCE) 10 MG TABS tablet insulin glargine (LANTUS SOLOSTAR) 100 UNIT/ML Solostar Pen valsartan (DIOVAN) 160 MG tablet metFORMIN (GLUCOPHAGE-XR) 500 MG 24 hr tablet Endocrine Uncontrolled type 2 diabetes mellitus with hyperglycemia (HCC) - Primary  Hyperglycemia not controlled A1c is 11  Plan to resume Trulicity 3 mg weekly Resume Jardiance 10 mg daily Increase insulin glargine 45 units daily Continue metformin at 1000 mg twice daily  See hypertension assessment for ACL M intervention  See clinical pharmacy 3 weeks return to Dr. Joya Gaskins 2 months  Patient told to exercise by walking 30 minutes 5 times a week  Recommended patient get a dental and eye exam    Relevant Medications atorvastatin (LIPITOR) 20 MG tablet Dulaglutide (TRULICITY) 3 FG/1.8EX SOPN empagliflozin (JARDIANCE) 10 MG TABS tablet insulin glargine (LANTUS SOLOSTAR) 100 UNIT/ML Solostar Pen valsartan (DIOVAN) 160 MG tablet metFORMIN (GLUCOPHAGE-XR) 500 MG 24 hr tablet Other Relevant Orders Urine microalbumin-creatinine with uACR Comprehensive metabolic panel with eGFR (exluding [facility]ST, no eGFR) HgB A1c (Completed) Glucose (CBG), Fasting (Completed) Other BMI 45.0-49.9, adult (HCC)  As per diabetes and hypertension assessments    Relevant Medications Dulaglutide (TRULICITY) 3 HB/7.1IR SOPN empagliflozin (JARDIANCE) 10 MG TABS tablet insulin glargine (LANTUS SOLOSTAR) 100 UNIT/ML Solostar Pen metFORMIN (GLUCOPHAGE-XR) 500 MG 24 hr tablet       Review of Systems  Constitutional:  Negative for chills, diaphoresis, fever, malaise/fatigue and weight loss.  HENT:  Negative for congestion, ear discharge, ear pain, hearing loss, nosebleeds, sore throat and tinnitus.   Eyes:   Negative for blurred vision, double vision, photophobia and discharge.  Respiratory:  Negative for cough, hemoptysis, sputum production, shortness of breath, wheezing and stridor.        No excess mucus  Cardiovascular:  Negative for chest pain, palpitations, orthopnea, claudication, leg swelling and PND.  Gastrointestinal:  Negative for abdominal pain, blood in stool, constipation, diarrhea, heartburn, melena, nausea and vomiting.  Genitourinary:  Negative for dysuria, flank pain, frequency, hematuria and urgency.  Musculoskeletal:  Negative for back pain, falls, joint pain, myalgias and neck pain.  Skin:  Negative for itching and rash.  Neurological:  Negative for dizziness, tingling, tremors, sensory change, speech change, focal weakness, seizures, loss of consciousness, weakness and headaches.  Endo/Heme/Allergies:  Negative for environmental allergies and polydipsia. Does not bruise/bleed easily.  Psychiatric/Behavioral:  Negative for depression, hallucinations, memory loss, substance abuse and suicidal ideas. The patient is not nervous/anxious and does not have insomnia.   All other systems reviewed and are negative.     Objective:     There were no vitals taken for this visit.   Physical Exam Vitals reviewed.  Constitutional:      Appearance: Normal appearance. He is well-developed. He is obese. He is not diaphoretic.  HENT:     Head: Normocephalic and atraumatic.     Nose: No nasal deformity, septal deviation, mucosal edema or rhinorrhea.     Right Sinus: No maxillary sinus tenderness  or frontal sinus tenderness.     Left Sinus: No maxillary sinus tenderness or frontal sinus tenderness.     Mouth/Throat:     Pharynx: No oropharyngeal exudate.     Comments: Mild periodontal disease Eyes:     General: No scleral icterus.    Conjunctiva/sclera: Conjunctivae normal.     Pupils: Pupils are equal, round, and reactive to light.  Neck:     Thyroid: No thyromegaly.     Vascular:  No carotid bruit or JVD.     Trachea: Trachea normal. No tracheal tenderness or tracheal deviation.  Cardiovascular:     Rate and Rhythm: Normal rate and regular rhythm.     Chest Wall: PMI is not displaced.     Pulses: Normal pulses. No decreased pulses.     Heart sounds: Normal heart sounds, S1 normal and S2 normal. Heart sounds not distant. No murmur heard.    No systolic murmur is present.     No diastolic murmur is present.     No friction rub. No gallop. No S3 or S4 sounds.  Pulmonary:     Effort: Pulmonary effort is normal. No tachypnea, accessory muscle usage or respiratory distress.     Breath sounds: Normal breath sounds. No stridor. No decreased breath sounds, wheezing, rhonchi or rales.  Chest:     Chest wall: No tenderness.  Abdominal:     General: Bowel sounds are normal. There is no distension.     Palpations: Abdomen is soft. Abdomen is not rigid.     Tenderness: There is no abdominal tenderness. There is no guarding or rebound.  Musculoskeletal:        General: Normal range of motion.     Cervical back: Normal range of motion and neck supple. No edema, erythema or rigidity. No muscular tenderness. Normal range of motion.     Comments: Foot exam is normal  Lymphadenopathy:     Head:     Right side of head: No submental or submandibular adenopathy.     Left side of head: No submental or submandibular adenopathy.     Cervical: No cervical adenopathy.  Skin:    General: Skin is warm and dry.     Coloration: Skin is not pale.     Findings: No rash.     Nails: There is no clubbing.  Neurological:     Mental Status: He is alert and oriented to person, place, and time.     Sensory: No sensory deficit.  Psychiatric:        Mood and Affect: Mood normal.        Speech: Speech normal.        Behavior: Behavior normal.        Thought Content: Thought content normal.     No results found for any visits on 09/05/21.     The 10-year ASCVD risk score (Arnett DK, et  al., 2019) is: 16.9%    Assessment & Plan:   Problem List Items Addressed This Visit   None 45 minutes spent assessing patient performing history and physical reviewing old records complex decision making is high extra time spent with patient education No follow-ups on file.    Asencion Noble, MD

## 2021-09-05 ENCOUNTER — Ambulatory Visit: Payer: 59 | Admitting: Critical Care Medicine

## 2021-09-17 ENCOUNTER — Other Ambulatory Visit: Payer: Self-pay | Admitting: Critical Care Medicine

## 2021-09-21 ENCOUNTER — Telehealth: Payer: Self-pay | Admitting: Critical Care Medicine

## 2021-09-21 NOTE — Telephone Encounter (Signed)
Patient dropped off paperwork, and front desk staff made him aware that you will return Monday

## 2021-09-21 NOTE — Telephone Encounter (Signed)
Copied from [place] [id number]. Topic: General - Call Back - No Documentation >> Sep 21, 2021  1:04 PM Jason Bruce wrote: Reason for CRM: Pt needs a call back regarding his short term disability paperwork with [employer]. He says they faxed over information to his PCP and it is currently pending. So he is going to bring it to the office.   09/03/2021 date he filed  09/13/2021 is his return date  They need the claim number on the cover sheet once this is faxed.  Fax: [phone] Attn Code: [id number]  Turn around time after this is sent from PCP is 2-3 business days to be approved.   Best contact: [phone]

## 2021-09-21 NOTE — Telephone Encounter (Signed)
Follow up with patient.

## 2021-09-24 ENCOUNTER — Telehealth: Payer: Self-pay | Admitting: Critical Care Medicine

## 2021-09-24 NOTE — Telephone Encounter (Signed)
Noted, patient has appointment

## 2021-09-24 NOTE — Telephone Encounter (Signed)
Patient has appointment on Wednesday to finish filling out forms.

## 2021-09-24 NOTE — Telephone Encounter (Signed)
I spoke to the patient and need to see him to be able to fill out the form and him on at 9 AM this Wednesday the 16th and double the appointment

## 2021-09-24 NOTE — Telephone Encounter (Signed)
Patient checking status of std forms  Patient inquiring if forms can be expedited   Patient requesting a return call once forms faxed

## 2021-09-26 ENCOUNTER — Ambulatory Visit: Payer: 59 | Attending: Critical Care Medicine | Admitting: Critical Care Medicine

## 2021-09-26 ENCOUNTER — Encounter: Payer: Self-pay | Admitting: Critical Care Medicine

## 2021-09-26 ENCOUNTER — Ambulatory Visit: Payer: Self-pay

## 2021-09-26 VITALS — BP 128/86 | HR 90 | Temp 98.1°F | Ht 69.0 in | Wt 314.2 lb

## 2021-09-26 DIAGNOSIS — K5720 Diverticulitis of large intestine with perforation and abscess without bleeding: Secondary | ICD-10-CM

## 2021-09-26 DIAGNOSIS — E119 Type 2 diabetes mellitus without complications: Secondary | ICD-10-CM

## 2021-09-26 DIAGNOSIS — K5732 Diverticulitis of large intestine without perforation or abscess without bleeding: Secondary | ICD-10-CM | POA: Diagnosis not present

## 2021-09-26 DIAGNOSIS — Z6841 Body Mass Index (BMI) 40.0 and over, adult: Secondary | ICD-10-CM

## 2021-09-26 DIAGNOSIS — Z794 Long term (current) use of insulin: Secondary | ICD-10-CM | POA: Diagnosis not present

## 2021-09-26 DIAGNOSIS — I10 Essential (primary) hypertension: Secondary | ICD-10-CM

## 2021-09-26 DIAGNOSIS — N3000 Acute cystitis without hematuria: Secondary | ICD-10-CM

## 2021-09-26 LAB — POCT GLYCOSYLATED HEMOGLOBIN (HGB A1C): HbA1c, POC (controlled diabetic range): 6.8 % (ref 0.0–7.0)

## 2021-09-26 LAB — GLUCOSE, POCT (MANUAL RESULT ENTRY): POC Glucose: 113 mg/dl — AB (ref 70–99)

## 2021-09-26 MED ORDER — CARVEDILOL 25 MG PO TABS: 25.0000 mg | ORAL_TABLET | Freq: Two times a day (BID) | ORAL | 1 refills | Status: DC

## 2021-09-26 MED ORDER — METFORMIN HCL ER 500 MG PO TB24: 1000.0000 mg | ORAL_TABLET | Freq: Two times a day (BID) | ORAL | 1 refills | Status: DC

## 2021-09-26 MED ORDER — TRULICITY 3 MG/0.5ML ~~LOC~~ SOAJ: SUBCUTANEOUS | 2 refills | Status: DC

## 2021-09-26 MED ORDER — HYDRALAZINE HCL 100 MG PO TABS: 100.0000 mg | ORAL_TABLET | Freq: Three times a day (TID) | ORAL | 2 refills | Status: DC

## 2021-09-26 MED ORDER — EMPAGLIFLOZIN 10 MG PO TABS: 10.0000 mg | ORAL_TABLET | Freq: Every day | ORAL | 3 refills | Status: DC

## 2021-09-26 MED ORDER — VALSARTAN 160 MG PO TABS: 160.0000 mg | ORAL_TABLET | Freq: Every day | ORAL | 3 refills | Status: DC

## 2021-09-26 NOTE — Telephone Encounter (Signed)
According to the Rx I sent it is Two BID   And I sent #[id number]   IDK why [facility] sees it differently

## 2021-09-26 NOTE — Assessment & Plan Note (Addendum)
The following Lifestyle Medicine recommendations according to [place] [facility]) were discussed and offered to patient who agrees to start the journey:  A. Whole Foods, Plant-based plate comprising of fruits and vegetables, plant-based proteins, whole-grain carbohydrates was discussed in detail with the patient.   A list for source of those nutrients were also provided to the patient.  Patient will use only water or unsweetened tea for hydration. B.  The need to stay away from risky substances including alcohol, smoking; obtaining 7 to 9 hours of restorative sleep, at least 150 minutes of moderate intensity exercise weekly, the importance of healthy social connections,  and stress reduction techniques were discussed.  The patient has joined a gym we did stress he should not be eating any nuts or seeds with his diet

## 2021-09-26 NOTE — Addendum Note (Signed)
Addended by: Carilyn Goodpasture on: 09/26/2021 02:37 PM   Modules accepted: Orders

## 2021-09-26 NOTE — Assessment & Plan Note (Signed)
Hypertension blood pressure under good control at this time plan is to continue amlodipine hydralazine and carvedilol

## 2021-09-26 NOTE — Assessment & Plan Note (Signed)
Symptom complex improved

## 2021-09-26 NOTE — Progress Notes (Signed)
Established Patient Office Visit  Subjective   Patient ID: Jason Bruce, male    DOB: 01/17/1976  Age: 46 y.o. MRN: [id number]  Chief Complaint  Patient presents with   Diabetes    07/05/21 This is a pleasant 46 year old male with type 2 diabetes hypertension and now history of diverticulitis with microperforation of the colon in April of this year.  Below is a copy of the discharge summary.  During this hospitalization it was discerned that he has sleep apnea and has a follow-up sleep medicine appointment in July.  The patient on arrival has blood pressure 137/98 and A1c of 11 with blood sugar 188.  Patient supposed to be on Trulicity and Jardiance but has not not had this filled in some time.  He is now on Lantus 40 units daily and metformin 1000 mg twice daily  He cannot tell me any of his blood sugar numbers from home or his blood pressure numbers as he does have a home blood pressure meter.  Currently the patient is only taking amlodipine 10 mg daily and metoprolol 50 mg daily for blood pressure.  The patient did have a follow-up visit with gastroenterology and colorectal surgery they said his colitis has improved he is off all antibiotics.  Patient's had no difficulties with his abdomen since her last visit with the colorectal surgeon who has released him.    Admit date: 05/29/2021  Discharge date and time: 06/04/2021  Admitting Physician: Jason Riles, MD  Discharge Physician: Jason Mom, MD  Admission Diagnoses: Diverticulitis with microperforation  Discharge Diagnoses:  Principal Problem: Diverticulitis of colon with perforation Active Problems: Uncontrolled hypertension History of kidney stones Essential hypertension Uncontrolled type 2 diabetes mellitus with hyperglycemia ([name]) Resolved Problems: Nausea with vomiting  Indication for Admission: Diverticulitis with microperforation  History of present illness taken from history and physical dictated  4/18: Jason Bruce is a 46 y.o. male with a pmhx of DM II, HTN, HLD, kidney stones that presented to [facility] ED for complaints of abdominal pain that started Saturday evening and vomiting that started this morning. The patient was recently seen in the ED 04/26/2021 and diagnosed with simple diverticulitis and prescribed ciprofloxacin and metronidazole for 10 days and a referral was placed to GI. He states that he was feeling better so he stopped taking them. He did follow up with the GI physician who told him what foods to avoid with active infection. The patient felt the infection was gone so Saturday he ate some popcorn. He started having LUQ cramping pain 3/10, his wife gave him the rest of the antibiotics he did not complete with the initial infection and tylenol, this did nothing for the pain. The pain continued until this morning where he began vomiting. Then he proceeded to the ED for evaluation. Of note the patient is a diabetic has hx of HTN and has hx of kidney stones , he admits to not taking his blood pressure medications for days and not taking his diabetic medications for some time because of the way it makes him feel. The patient was educated on need for compliance with medication regimen as he is at risk for heart attack, stroke , ESRD, loss of digits or limb Denies fevers, chills night sweats, Shortness of breath , diarrhea, patient has been nauseated with episodes of non bloody bilious vomiting   ED course: Patient arrived to the ED with vitals as follows: T 84F,P 111, RR 18, BP 193/133, SPO2 97% on room air.  CBC with diff unremarkable, CMP Na 133, glucose 361. CT abd pelvis wo contrast IMPRESSION: Mild improvement in proximal sigmoid diverticulitis, with microperforation. No evidence of abscess. Bilateral nephrolithiasis. No evidence of ureteral calculi or hydronephrosis. Stable hepatic steatosis.   Hospital Course:  1.  Acute diverticulitis with microperforation - greatly appreciate input  from surgical team. Surgical team notes that with microperforation this is a complicated diverticulitis and we are hopeful that patient will be able to have adequate healing with antibiotic treatment only.  Unfortunately, if adequate healing is not noted, patient may require surgical intervention.  Patient will complete antibiotic course and follow-up with colorectal surgery on an outpatient basis for repeat imaging and consideration of surgical invention if necessary   2.  Hypertensive urgency - blood pressure as high as 229/142 upon arrival in emergency department.  During hospitalization, antihypertensive medications were adjusted and during the last 24 hours, patient's blood pressure ranged from 131/68-153/99. Continue current medication regimen. We will defer to primary care doctor for continued titration of antihypertensive medications   3.  Uncontrolled diabetes mellitus with hyperglycemia - hemoglobin A1c updated at time of admission and is 14.1.  With adjustments to medication regimen, patient's blood glucose ranging from 149-221 in the last 24 hours. Compliance with diabetic diet has been emphasized   4.  Hyperlipidemia - continue statin   5.  Suspected obstructive sleep apnea - patient had discussed with surgical team that he has frequent awakenings during the night and notes that his wife has commented about loud snoring.  Patient was agreeable to try CPAP at nighttime.  Following initiation of CPAP, patient reports that he feels that he is able to sleep better and it has improved his breathing.  He would likely benefit from CPAP for home use.  We will arrange for sleep study following discharge from the hospital   6. Morbid /Severe Obesity - BMI 49, morbid obesity complicates all facets of patient care   7.  Bilateral nephrolithiasis - no evidence of ureteral calculi nor hydronephrosis.    Patient's Ordered Code Status: Full Code  Consults:  Orders Placed This Encounter  Procedures    Consult To Surgery (General)   Significant Diagnostic Studies:  CT abdomen/pelvis 4/18: Mild improvement in proximal sigmoid diverticulitis, with  microperforation. No evidence of abscess.   Bilateral nephrolithiasis. No evidence of ureteral calculi or  hydronephrosis.   Stable hepatic steatosis.    8/16 Patient seen in return follow-up needing FMLA paperwork performed.  Patient was out of work between the 24th and 27 July because of diverticulitis kidney stone and urinary tract infection.  He was hospitalized during this period of time he did go back to work on 7 August.  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  He measures his blood pressure at home but only has a wrist monitor.  On arrival today blood pressure is at 128/86.  Patient is made dietary changes he is lost 30 pounds in weight he has been more adherent with his diabetic program and an A1c on arrival today is 6.8.  Patient has follow-up with his gastroenterologist.      Review of Systems  Constitutional:  Negative for chills, diaphoresis, fever, malaise/fatigue and weight loss.  HENT:  Negative for congestion, ear discharge, ear pain, hearing loss, nosebleeds, sore throat and tinnitus.   Eyes:  Negative for blurred vision, double vision, photophobia and discharge.  Respiratory:  Negative for cough, hemoptysis, sputum production, shortness of  breath, wheezing and stridor.        No excess mucus  Cardiovascular:  Negative for chest pain, palpitations, orthopnea, claudication, leg swelling and PND.  Gastrointestinal:  Negative for abdominal pain, blood in stool, constipation, diarrhea, heartburn, melena, nausea and vomiting.  Genitourinary:  Negative for dysuria, flank pain, frequency, hematuria and urgency.  Musculoskeletal:  Negative for back pain, falls, joint pain, myalgias and neck pain.  Skin:  Negative for itching and rash.  Neurological:  Negative for dizziness, tingling, tremors,  sensory change, speech change, focal weakness, seizures, loss of consciousness, weakness and headaches.  Endo/Heme/Allergies:  Negative for environmental allergies and polydipsia. Does not bruise/bleed easily.  Psychiatric/Behavioral:  Negative for depression, hallucinations, memory loss, substance abuse and suicidal ideas. The patient is not nervous/anxious and does not have insomnia.   All other systems reviewed and are negative.     Objective:     BP 128/86   Pulse 90   Temp 98.1 F (36.7 C) (Oral)   Ht '5\' 9"'$  (1.753 m)   Wt (!) 314 lb 3.2 oz (142.5 kg)   SpO2 96%   BMI 46.40 kg/m    Physical Exam Vitals reviewed.  Constitutional:      Appearance: Normal appearance. He is well-developed. He is obese. He is not diaphoretic.  HENT:     Head: Normocephalic and atraumatic.     Nose: No nasal deformity, septal deviation, mucosal edema or rhinorrhea.     Right Sinus: No maxillary sinus tenderness or frontal sinus tenderness.     Left Sinus: No maxillary sinus tenderness or frontal sinus tenderness.     Mouth/Throat:     Pharynx: No oropharyngeal exudate.     Comments: Mild periodontal disease Eyes:     General: No scleral icterus.    Conjunctiva/sclera: Conjunctivae normal.     Pupils: Pupils are equal, round, and reactive to light.  Neck:     Thyroid: No thyromegaly.     Vascular: No carotid bruit or JVD.     Trachea: Trachea normal. No tracheal tenderness or tracheal deviation.  Cardiovascular:     Rate and Rhythm: Normal rate and regular rhythm.     Chest Wall: PMI is not displaced.     Pulses: Normal pulses. No decreased pulses.     Heart sounds: Normal heart sounds, S1 normal and S2 normal. Heart sounds not distant. No murmur heard.    No systolic murmur is present.     No diastolic murmur is present.     No friction rub. No gallop. No S3 or S4 sounds.  Pulmonary:     Effort: Pulmonary effort is normal. No tachypnea, accessory muscle usage or respiratory distress.      Breath sounds: Normal breath sounds. No stridor. No decreased breath sounds, wheezing, rhonchi or rales.  Chest:     Chest wall: No tenderness.  Abdominal:     General: Bowel sounds are normal. There is no distension.     Palpations: Abdomen is soft. Abdomen is not rigid.     Tenderness: There is no abdominal tenderness. There is no guarding or rebound.  Musculoskeletal:        General: Normal range of motion.     Cervical back: Normal range of motion and neck supple. No edema, erythema or rigidity. No muscular tenderness. Normal range of motion.  Lymphadenopathy:     Head:     Right side of head: No submental or submandibular adenopathy.     Left side of  head: No submental or submandibular adenopathy.     Cervical: No cervical adenopathy.  Skin:    General: Skin is warm and dry.     Coloration: Skin is not pale.     Findings: No rash.     Nails: There is no clubbing.  Neurological:     Mental Status: He is alert and oriented to person, place, and time.     Sensory: No sensory deficit.  Psychiatric:        Mood and Affect: Mood normal.        Speech: Speech normal.        Behavior: Behavior normal.        Thought Content: Thought content normal.      Results for orders placed or performed in visit on 09/26/21  POCT glucose (manual entry)  Result Value Ref Range   POC Glucose 113 (A) 70 - 99 mg/dl  POCT glycosylated hemoglobin (Hb A1C)  Result Value Ref Range   Hemoglobin A1C     HbA1c POC (<> result, manual entry)     HbA1c, POC (prediabetic range)     HbA1c, POC (controlled diabetic range) 6.8 0.0 - 7.0 %      The 10-year ASCVD risk score (Arnett DK, et al., 2019) is: 14.3%    Assessment & Plan:   Problem List Items Addressed This Visit       Cardiovascular and Mediastinum   Hypertension    Hypertension blood pressure under good control at this time plan is to continue amlodipine hydralazine and carvedilol      Relevant Medications   carvedilol  (COREG) 25 MG tablet   hydrALAZINE (APRESOLINE) 100 MG tablet   valsartan (DIOVAN) 160 MG tablet     Digestive   Diverticulitis of colon with perforation    Symptom complex improved      Relevant Orders   Ambulatory referral to Gastroenterology   Sigmoid diverticulitis   Relevant Orders   Ambulatory referral to Gastroenterology     Endocrine   Controlled type 2 diabetes mellitus ([place]) - Primary    Type 2 diabetes improved control continue current program      Relevant Medications   Dulaglutide (TRULICITY) 3 EX/5.2WU SOPN   empagliflozin (JARDIANCE) 10 MG TABS tablet   metFORMIN (GLUCOPHAGE-XR) 500 MG 24 hr tablet   valsartan (DIOVAN) 160 MG tablet   Other Relevant Orders   POCT glucose (manual entry) (Completed)   POCT glycosylated hemoglobin (Hb A1C) (Completed)     Genitourinary   RESOLVED: UTI (urinary tract infection)    Infection has resolved        Other   BMI 45.0-49.9, adult [facility])    The following Lifestyle Medicine recommendations according to [place] of Lifestyle Medicine [facility]) were discussed and offered to patient who agrees to start the journey:  A. Whole Foods, Plant-based plate comprising of fruits and vegetables, plant-based proteins, whole-grain carbohydrates was discussed in detail with the patient.   A list for source of those nutrients were also provided to the patient.  Patient will use only water or unsweetened tea for hydration. B.  The need to stay away from risky substances including alcohol, smoking; obtaining 7 to 9 hours of restorative sleep, at least 150 minutes of moderate intensity exercise weekly, the importance of healthy social connections,  and stress reduction techniques were discussed.  The patient has joined a gym we did stress he should not be eating any nuts or seeds with his diet  Relevant Medications   Dulaglutide (TRULICITY) 3 JO/8.3GP SOPN   empagliflozin (JARDIANCE) 10 MG TABS tablet   metFORMIN (GLUCOPHAGE-XR)  500 MG 24 hr tablet  Return in about 4 months (around 01/26/2022).    Asencion Noble, MD

## 2021-09-26 NOTE — Telephone Encounter (Signed)
Chief Complaint: [place] [facility] calling with questions about Metformin Rx they received Symptoms: N/A Frequency: N/A Pertinent Negatives: Patient denies N/A Disposition: '[]'$ ED /'[]'$ Urgent Care (no appt availability in office) / '[]'$ Appointment(In office/virtual)/ '[]'$  [facility]/ '[]'$ Home Care/ '[]'$ Refused Recommended Disposition /'[]'$ [facility]/ '[x]'$  Follow-up with PCP Additional Notes: Cassandra, RPH has questions about the directions on the Metformin (2 tabs BID or 2 tabs in the morning and 1 in the evening) and the quantity of 180 is only for 45 days not a 90 day supply (will need the sig adjusted to be a 90 day supply). Advised I will send this to Dr. Joya Gaskins for clarity and a new Rx will be sent or a phone call to the pharmacy with clarification, she verbalized understanding.     Summary: Metformin prescription?   Cassandra with [facility] called and needs clarification on the patients metformin prescription. The quantity is 180 which is a 45 day supply so she says its a weird amount. The prescription states 2 in the am and at bedtime. Please assist further.      Reason for Disposition . [1] Pharmacy calling with prescription question AND [2] triager unable to answer question  Answer Assessment - Initial Assessment Questions 1. NAME of MEDICINE: "What medicine(s) are you calling about?"     Metformin 2. QUESTION: "What is your question?" (e.g., double dose of medicine, side effect)     Unclear of instructions and the quantity given 3. PRESCRIBER: "Who prescribed the medicine?" Reason: if prescribed by specialist, call should be referred to that group.     Dr. Joya Gaskins 4. SYMPTOMS: "Do you have any symptoms?" If Yes, ask: "What symptoms are you having?"  "How bad are the symptoms (e.g., mild, moderate, severe)     N/A 5. PREGNANCY:  "Is there any chance that you are pregnant?" "When was your last menstrual period?"     N/A  Protocols used: Medication Question  Call-A-AH

## 2021-09-26 NOTE — Patient Instructions (Signed)
Paperwork completed  Increase carvedilol to 25 mg twice daily can take 2 of the 12 and half milligram tablets twice daily but the refill will be a single 25 mg to take twice daily  No change in any of your other medications refill sent to your [facility] in [facility]  Gastroenterology referral to [place] was made for your diverticulosis  Keep your follow-up appointments with your urologist  Return to see Dr. Joya Gaskins 4 months  Follow-up with Lurena Joiner our clinical pharmacist for your blood pressure in the next 1 month

## 2021-09-26 NOTE — Assessment & Plan Note (Signed)
Infection has resolved

## 2021-09-26 NOTE — Telephone Encounter (Signed)
Pharmacist was stating that Rx needed more pills if he is getting a 90 day supply. Otherwise he will have 45 days with a quantity of 180.   Resent rx for 90 day supply, 360 quantity with current sig: Metformin 1000 mg BID.

## 2021-09-26 NOTE — Assessment & Plan Note (Signed)
Type 2 diabetes improved control continue current program

## 2021-09-28 ENCOUNTER — Other Ambulatory Visit: Payer: Self-pay | Admitting: Critical Care Medicine

## 2021-10-31 NOTE — Progress Notes (Deleted)
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 PMH is significant for HTN, OSA, diverticulitis, obesity, and T2DM.   Patient was referred and last seen by Primary Care Provider, Dr. Joya Gaskins, on 8/16.   At last visit with the pharmacy team on 6/30, patient reported compliance with his medication changes, previously reported non-adherence d/t not liking the way it made him feel. No reports of hypoglycemia or GI symptoms with his Trulicity.  His home BG readings had improved dramatically since previous PCP visit in May of 2023.  At last visit with PCP on 8/16, patient reported dietary changes and 30 pounds of weight loss. A1c was 6.8, significant improvement from 11.1 in May of this year. His BP and BG were controlled so no medication changes were made at that time.   Today, patient arrives in *** good spirits and presents without *** any assistance. ***  Family/Social History:  -Fhx: HTN, DM, and cancer -Tobacco: denies -Alcohol: denies  Current diabetes medications include: Trulicity '3mg'$  once a week, Jardiance '10mg'$  once daily, Metformin '500mg'$  XR 2 tablets BID Current hypertension medications include: amlodipine '10mg'$  once daily, carvedilol '25mg'$  BID, hydralazine '100mg'$  TID, valsartan '160mg'$  once daily  Current hyperlipidemia medications include: atorvastatin '10mg'$  once daily  Patient reports adherence to taking all medications as prescribed.  *** Patient denies adherence with medications, reports missing *** medications *** times per week, on average.  Do you feel that your medications are working for you? {YES NO:22349} Have you been experiencing any side effects to the medications prescribed? {YES NO:22349} Do you have any problems obtaining medications due to transportation or finances? {YES [id number] Insurance coverage: ***  Patient {Actions; denies-reports:120008} hypoglycemic events.  Reported home  fasting blood sugars: ***  Reported 2 hour post-meal/random blood sugars: ***.  Patient {Actions; denies-reports:120008} nocturia (nighttime urination).  Patient {Actions; denies-reports:120008} neuropathy (nerve pain). Patient {Actions; denies-reports:120008} visual changes. Patient {Actions; denies-reports:120008} self foot exams.   Patient reported dietary habits: Eats 3 meals/day - Has eliminated pork, red meat. Does not bread proteins. Has increased his intake of fish, salmon. - Has cut back on pastas and potatoes.    - Has eliminated sodas. Now drinks "Fit Soda" which is 0 sugar. Drinks mostly water and plant-based milk.   Patient-reported exercise habits:  - ***   O:   Lab Results  Component Value Date   HGBA1C 6.8 09/26/2021   There were no vitals filed for this visit.  Lipid Panel     Component Value Date/Time   CHOL 208 (H) 02/01/2020 1136   TRIG 139 02/01/2020 1136   HDL 48 02/01/2020 1136   CHOLHDL 4.3 02/01/2020 1136   LDLCALC 135 (H) 02/01/2020 1136    Clinical Atherosclerotic Cardiovascular Disease (ASCVD): No  The 10-year ASCVD risk score (Arnett DK, et al., 2019) is: 16.1%   Values used to calculate the score:     Age: 74 years     Sex: Male     Is Non-Hispanic African American: Yes     Diabetic: Yes     Tobacco smoker: No     Systolic Blood Pressure: 401 mmHg     Is BP treated: Yes     HDL Cholesterol: 48 mg/dL     Total Cholesterol: 208 mg/dL    A/P: Diabetes longstanding *** currently ***. Patient is *** able to verbalize appropriate hypoglycemia management plan. Medication adherence appears ***. Control  is suboptimal due to ***. -{Meds adjust:18428} basal insulin *** (insulin ***). Patient will continue to titrate 1 unit every *** days if fasting blood sugar > '100mg'$ /dl until fasting blood sugars reach goal or next visit.  -{Meds adjust:18428} rapid insulin *** (insulin ***) to ***.  -{Meds adjust:18428} GLP-1 *** (generic ***) to ***.  -{Meds  adjust:18428} SGLT2-I *** (generic ***) to ***. Counseled on sick day rules. -{Meds adjust:18428} metformin *** to ***.  -Patient educated on purpose, proper use, and potential adverse effects of ***.  -Extensively discussed pathophysiology of diabetes, recommended lifestyle interventions, dietary effects on blood sugar control.  -Counseled on s/sx of and management of hypoglycemia.  -Next A1c anticipated ***.   ASCVD risk - primary prevention in patient with diabetes. Last LDL is not at goal of <70 mg/dL. ASCVD risk factors include HTN, DM and 10-year ASCVD risk score of 16.1. high intensity statin indicated.  -{Meds adjust:18428} atorvastatin *** mg.   Hypertension longstanding *** currently ***. Blood pressure goal of <130/80 *** mmHg. Medication adherence ***. Blood pressure control is suboptimal due to ***. -***  Written patient instructions provided. Patient verbalized understanding of treatment plan.  Total time in face to face counseling *** minutes.    Follow-up:  Pharmacist ***. PCP clinic visit in December  Maryan Puls, PharmD PGY-1 [facility] Resident

## 2021-11-01 ENCOUNTER — Ambulatory Visit: Payer: 59 | Admitting: Pharmacist

## 2021-11-12 ENCOUNTER — Telehealth: Payer: Self-pay | Admitting: Critical Care Medicine

## 2021-11-12 NOTE — Telephone Encounter (Signed)
I informed Jason Bruce I need at least a virtual visit to complete this paper work

## 2021-11-12 NOTE — Telephone Encounter (Signed)
Pt is calling to see if paperwork has been sent to HR accomodation. HR accomodations states that it has not been received yet. Pt states that he hand delivered the paper work last Monday Cb- [phone]

## 2021-11-13 NOTE — Telephone Encounter (Signed)
Appt made for Thursday

## 2021-11-15 ENCOUNTER — Encounter: Payer: Self-pay | Admitting: Critical Care Medicine

## 2021-11-15 ENCOUNTER — Ambulatory Visit (HOSPITAL_BASED_OUTPATIENT_CLINIC_OR_DEPARTMENT_OTHER): Payer: 59 | Admitting: Critical Care Medicine

## 2021-11-15 VITALS — BP 126/76

## 2021-11-15 DIAGNOSIS — Z6841 Body Mass Index (BMI) 40.0 and over, adult: Secondary | ICD-10-CM | POA: Diagnosis not present

## 2021-11-15 DIAGNOSIS — I10 Essential (primary) hypertension: Secondary | ICD-10-CM | POA: Diagnosis not present

## 2021-11-15 DIAGNOSIS — E11630 Type 2 diabetes mellitus with periodontal disease: Secondary | ICD-10-CM

## 2021-11-15 DIAGNOSIS — Z794 Long term (current) use of insulin: Secondary | ICD-10-CM

## 2021-11-15 DIAGNOSIS — Z029 Encounter for administrative examinations, unspecified: Secondary | ICD-10-CM

## 2021-11-15 DIAGNOSIS — K5732 Diverticulitis of large intestine without perforation or abscess without bleeding: Secondary | ICD-10-CM | POA: Diagnosis not present

## 2021-11-15 NOTE — Assessment & Plan Note (Signed)
Weight is coming down continuously

## 2021-11-15 NOTE — Assessment & Plan Note (Signed)
No change in medications patient's controlled

## 2021-11-15 NOTE — Assessment & Plan Note (Signed)
Extra time needed getting information to fill out paperwork paperwork filled out for his accommodations

## 2021-11-15 NOTE — Assessment & Plan Note (Signed)
Improved with dental care

## 2021-11-15 NOTE — Assessment & Plan Note (Signed)
This has resolved following a diverticulosis diet

## 2021-11-15 NOTE — Progress Notes (Signed)
Established Patient Office Visit  Subjective   Patient ID: Jason Bruce, male    DOB: [birth year]  Age: 46 y.o. MRN: [id number]  Virtual Visit via Telephone Note  I connected with Jason Bruce on 11/15/21 at  8:30 AM EDT by telephone and verified that I am speaking with the correct person using two identifiers.   Consent:  I discussed the limitations, risks, security and privacy concerns of performing an evaluation and management service by telephone and the availability of in person appointments. I also discussed with the patient that there may be a patient responsible charge related to this service. The patient expressed understanding and agreed to proceed.  Location of patient:  Location of provider:  Persons participating in the televisit with the patient.       History of Present Illness:       07/05/21 This is a pleasant 46 year old male with type 2 diabetes hypertension and now history of diverticulitis with microperforation of the colon in April of this year.  Below is a copy of the discharge summary.  During this hospitalization it was discerned that he has sleep apnea and has a follow-up sleep medicine appointment in July.  The patient on arrival has blood pressure 137/98 and A1c of 11 with blood sugar 188.  Patient supposed to be on Trulicity and Jardiance but has not not had this filled in some time.  He is now on Lantus 40 units daily and metformin 1000 mg twice daily  He cannot tell me any of his blood sugar numbers from home or his blood pressure numbers as he does have a home blood pressure meter.  Currently the patient is only taking amlodipine 10 mg daily and metoprolol 50 mg daily for blood pressure.  The patient did have a follow-up visit with gastroenterology and colorectal surgery they said his colitis has improved he is off all antibiotics.  Patient's had no difficulties with his abdomen since her last visit with the colorectal surgeon who has released  him.    Admit date: 05/29/2021  Discharge date and time: 06/04/2021  Admitting Physician: Val Riles, MD  Discharge Physician: Renelda Mom, MD  Admission Diagnoses: Diverticulitis with microperforation  Discharge Diagnoses:  Principal Problem: Diverticulitis of colon with perforation Active Problems: Uncontrolled hypertension History of kidney stones Essential hypertension Uncontrolled type 2 diabetes mellitus with hyperglycemia ([place]) Resolved Problems: Nausea with vomiting  Indication for Admission: Diverticulitis with microperforation  History of present illness taken from history and physical dictated 4/18: Jason Bruce is a 46 y.o. male with a pmhx of DM II, HTN, HLD, kidney stones that presented to [facility] ED for complaints of abdominal pain that started Saturday evening and vomiting that started this morning. The patient was recently seen in the ED 04/26/2021 and diagnosed with simple diverticulitis and prescribed ciprofloxacin and metronidazole for 10 days and a referral was placed to GI. He states that he was feeling better so he stopped taking them. He did follow up with the GI physician who told him what foods to avoid with active infection. The patient felt the infection was gone so Saturday he ate some popcorn. He started having LUQ cramping pain 3/10, his wife gave him the rest of the antibiotics he did not complete with the initial infection and tylenol, this did nothing for the pain. The pain continued until this morning where he began vomiting. Then he proceeded to the ED for evaluation. Of note the patient is a diabetic has hx  of HTN and has hx of kidney stones , he admits to not taking his blood pressure medications for days and not taking his diabetic medications for some time because of the way it makes him feel. The patient was educated on need for compliance with medication regimen as he is at risk for heart attack, stroke , ESRD, loss of digits or limb Denies  fevers, chills night sweats, Shortness of breath , diarrhea, patient has been nauseated with episodes of non bloody bilious vomiting   ED course: Patient arrived to the ED with vitals as follows: T 64F,P 111, RR 18, BP 193/133, SPO2 97% on room air. CBC with diff unremarkable, CMP Na 133, glucose 361. CT abd pelvis wo contrast IMPRESSION: Mild improvement in proximal sigmoid diverticulitis, with microperforation. No evidence of abscess. Bilateral nephrolithiasis. No evidence of ureteral calculi or hydronephrosis. Stable hepatic steatosis.   Hospital Course:  1.  Acute diverticulitis with microperforation - greatly appreciate input from surgical team. Surgical team notes that with microperforation this is a complicated diverticulitis and we are hopeful that patient will be able to have adequate healing with antibiotic treatment only.  Unfortunately, if adequate healing is not noted, patient may require surgical intervention.  Patient will complete antibiotic course and follow-up with colorectal surgery on an outpatient basis for repeat imaging and consideration of surgical invention if necessary   2.  Hypertensive urgency - blood pressure as high as 229/142 upon arrival in emergency department.  During hospitalization, antihypertensive medications were adjusted and during the last 24 hours, patient's blood pressure ranged from 131/68-153/99. Continue current medication regimen. We will defer to primary care doctor for continued titration of antihypertensive medications   3.  Uncontrolled diabetes mellitus with hyperglycemia - hemoglobin A1c updated at time of admission and is 14.1.  With adjustments to medication regimen, patient's blood glucose ranging from 149-221 in the last 24 hours. Compliance with diabetic diet has been emphasized   4.  Hyperlipidemia - continue statin   5.  Suspected obstructive sleep apnea - patient had discussed with surgical team that he has frequent awakenings during the  night and notes that his wife has commented about loud snoring.  Patient was agreeable to try CPAP at nighttime.  Following initiation of CPAP, patient reports that he feels that he is able to sleep better and it has improved his breathing.  He would likely benefit from CPAP for home use.  We will arrange for sleep study following discharge from the hospital   6. Morbid /Severe Obesity - BMI 49, morbid obesity complicates all facets of patient care   7.  Bilateral nephrolithiasis - no evidence of ureteral calculi nor hydronephrosis.    Patient's Ordered Code Status: Full Code  Consults:  Orders Placed This Encounter  Procedures   Consult To Surgery (General)   Significant Diagnostic Studies:  CT abdomen/pelvis 4/18: Mild improvement in proximal sigmoid diverticulitis, with  microperforation. No evidence of abscess.   Bilateral nephrolithiasis. No evidence of ureteral calculi or  hydronephrosis.   Stable hepatic steatosis.    8/16 Patient seen in return follow-up needing FMLA paperwork performed.  Patient was out of work between the 24th and 27 July because of diverticulitis kidney stone and urinary tract infection.  He was hospitalized during this period of time he did go back to work on 7 August.  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  He measures his blood pressure at home but only has  a wrist monitor.  On arrival today blood pressure is at 128/86.  Patient is made dietary changes he is lost 30 pounds in weight he has been more adherent with his diabetic program and an A1c on arrival today is 6.8.  Patient has follow-up with his gastroenterologist.  10/5 This is a phone visit to clarify patient's medical accommodation for work.  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  He has had 4 admissions this past year for urinary tract issues and diverticulitis.  He averages about 2-4 admissions or ER visits yearly.  The good  news is he is improving his lifestyle and his health he is down to 300 pounds from 350 his A1c has come down blood sugars 90-120.  He is eating a healthier diet.  He does have follow-up with his gastroenterologist.  He is following a diverticulosis diet as well.  He had a follow-up with urology recently there is no stones on his renal scans.  He has no urinary symptoms now.  At home his blood pressure readings are 125/76.  Today      Review of Systems  Constitutional:  Negative for chills, diaphoresis, fever, malaise/fatigue and weight loss.  HENT:  Negative for congestion, ear discharge, ear pain, hearing loss, nosebleeds, sore throat and tinnitus.   Eyes:  Negative for blurred vision, double vision, photophobia and discharge.  Respiratory:  Negative for cough, hemoptysis, sputum production, shortness of breath, wheezing and stridor.        No excess mucus  Cardiovascular:  Negative for chest pain, palpitations, orthopnea, claudication, leg swelling and PND.  Gastrointestinal:  Negative for abdominal pain, blood in stool, constipation, diarrhea, heartburn, melena, nausea and vomiting.  Genitourinary:  Negative for dysuria, flank pain, frequency, hematuria and urgency.  Musculoskeletal:  Negative for back pain, falls, joint pain, myalgias and neck pain.  Skin:  Negative for itching and rash.  Neurological:  Negative for dizziness, tingling, tremors, sensory change, speech change, focal weakness, seizures, loss of consciousness, weakness and headaches.  Endo/Heme/Allergies:  Negative for environmental allergies and polydipsia. Does not bruise/bleed easily.  Psychiatric/Behavioral:  Negative for depression, hallucinations, memory loss, substance abuse and suicidal ideas. The patient is not nervous/anxious and does not have insomnia.   All other systems reviewed and are negative.     Objective:    No exam this is a phone visit BP 126/76 Comment: Home reading      No results found for  any visits on 11/15/21.     The 10-year ASCVD risk score (Arnett DK, et al., 2019) is: 13.9%    Assessment & Plan:   Problem List Items Addressed This Visit       Cardiovascular and Mediastinum   Hypertension    No change in medications patient's controlled        Digestive   Periodontal disease due to type 2 diabetes mellitus ([place])    Improved with dental care      RESOLVED: Sigmoid diverticulitis    This has resolved following a diverticulosis diet        Endocrine   Controlled type 2 diabetes mellitus ([place])    Continue current medications improved with lifestyle management        Other   BMI 45.0-49.9, adult ([place])    Weight is coming down continuously      Administrative encounter - Primary    Extra time needed getting information to fill out paperwork paperwork filled out for his accommodations  Follow Up Instructions: Follow-up will occur in December   I discussed the assessment and treatment plan with the patient. The patient was provided an opportunity to ask questions and all were answered. The patient agreed with the plan and demonstrated an understanding of the instructions.   The patient was advised to call back or seek an in-person evaluation if the symptoms worsen or if the condition fails to improve as anticipated.  I provided 30 minutes of non-face-to-face time during this encounter  including  median intraservice time , review of notes, labs, imaging, medications  and explaining diagnosis and management to the patient .    Asencion Noble, MD

## 2021-11-15 NOTE — Assessment & Plan Note (Signed)
Continue current medications improved with lifestyle management

## 2021-12-17 LAB — HEMOGLOBIN A1C: Hemoglobin A1C: 6.4

## 2022-01-02 ENCOUNTER — Ambulatory Visit (INDEPENDENT_AMBULATORY_CARE_PROVIDER_SITE_OTHER): Payer: 59 | Admitting: Nurse Practitioner

## 2022-01-02 ENCOUNTER — Encounter: Payer: Self-pay | Admitting: Nurse Practitioner

## 2022-01-02 VITALS — BP 143/102 | HR 90 | Temp 97.9°F | Ht 69.0 in | Wt 309.0 lb

## 2022-01-02 DIAGNOSIS — I10 Essential (primary) hypertension: Secondary | ICD-10-CM

## 2022-01-02 DIAGNOSIS — R809 Proteinuria, unspecified: Secondary | ICD-10-CM

## 2022-01-02 DIAGNOSIS — Z09 Encounter for follow-up examination after completed treatment for conditions other than malignant neoplasm: Secondary | ICD-10-CM | POA: Insufficient documentation

## 2022-01-02 DIAGNOSIS — E1129 Type 2 diabetes mellitus with other diabetic kidney complication: Secondary | ICD-10-CM

## 2022-01-02 DIAGNOSIS — A498 Other bacterial infections of unspecified site: Secondary | ICD-10-CM

## 2022-01-02 DIAGNOSIS — K5792 Diverticulitis of intestine, part unspecified, without perforation or abscess without bleeding: Secondary | ICD-10-CM

## 2022-01-02 NOTE — Assessment & Plan Note (Addendum)
BP Readings from Last 3 Encounters:  01/02/22 (!) 143/102  11/15/21 126/76  09/26/21 128/86  Blood pressure is elevated in the office today, patient reports that he has not taken his medications but he has been taking them consistently at home Currently on amlodipine 10 mg daily, valsartan 160 mg daily, hydralazine 100 mg 3 times daily, carvedilol 25 mg twice daily. Continue current medications DASH diet advised patient encouraged to engage in regular moderate to vigorous exercises at least 150 minutes weekly. Follow-up with PCP to recheck blood pressure in 4 weeks. BP goal is less than 130/80

## 2022-01-02 NOTE — Patient Instructions (Addendum)
Please follow up with your gastroenterologist as dicussed.    Around 3 times per week, check your blood pressure 2 times per day. once in the morning and once in the evening. The readings should be at least one minute apart. Write down these values and bring them to your next nurse visit/appointment.  When you check your BP, make sure you have been doing something calm/relaxing 5 minutes prior to checking. Both feet should be flat on the floor and you should be sitting. Use your left arm and make sure it is in a relaxed position (on a table), and that the cuff is at the approximate level/height of your heart.   Blood pressure goal is less than 130/80. Schedule an appointment with your PCP to follow up on your blood pressure     It is important that you exercise regularly at least 30 minutes 5 times a week as tolerated  Think about what you will eat, plan ahead. Choose " clean, green, fresh or frozen" over canned, processed or packaged foods which are more sugary, salty and fatty. 70 to 75% of food eaten should be vegetables and fruit. Three meals at set times with snacks allowed between meals, but they must be fruit or vegetables. Aim to eat over a 12 hour period , example 7 am to 7 pm, and STOP after  your last meal of the day. Drink water,generally about 64 ounces per day, no other drink is as healthy. Fruit juice is best enjoyed in a healthy way, by EATING the fruit.  Thanks for choosing [facility] we consider it a privelige to serve you.

## 2022-01-02 NOTE — Assessment & Plan Note (Signed)
Recently had acute flareup of diverticulitis on November 1. He has completed full course of antibiotics ordered Currently denies nausea vomiting abdominal pain fever chills. Patient was encouraged to follow-up with GI as planned

## 2022-01-02 NOTE — Assessment & Plan Note (Signed)
Was on admission at [facility] in [facility] from November 6 to November 9. He was treated with IV antibiotics while in the hospital was discharged home on Augmentin. Patient denies fever, chills malaise. However is interested in knowing which staff drew his blood while he was at the hospital stated that he has requested for this information from the hospital.

## 2022-01-02 NOTE — Assessment & Plan Note (Signed)
presents for follow-up for hospital admission for Staphylococcus epidermis bacteremia.  Originally went to the ED with complaints of nausea vomiting fever urinary frequency, he was treated for acute diverticulitis and discharged home on 11/1.  He returned on 11/6 to the hospital because his blood culture was positive  Staphylococcus epidermis.  ID was consulted.  They thought the source of the infection was true skin contamination.  Patient was treated with vancomycin and  ertapenem and discharged home on  Augmentin 875-125 mg twice daily for additional 7 days to complete antibiotic course for diverticulitis.  He was encouraged to follow-up outpatient with GI.  Was discharged on November 9.  Currently denies fever, chills, abdominal pain, nausea, vomiting, constipation.  He stated that he has called [facility] to request his medical records he is interested in knowing how the infection occurred and which staff was involved.   After visit summary and labs were reviewed by me today Patient was encouraged to follow-up with GI as planned

## 2022-01-02 NOTE — Progress Notes (Addendum)
Established Patient Office Visit  Subjective:  Patient ID: Jason Bruce, male    DOB: [birth year]  Age: 46 y.o. MRN: [id number]  CC:  Chief Complaint  Patient presents with   Hospitalization Follow-up    Per he is ok no pains     HPI Jason Bruce is a 46 y.o. male with past medical history of obesity, hypertension, type 2 diabetes, diverticulitis of colon with perforation, obstructive sleep apnea who presents for follow-up for hospital admission for Staphylococcus epidermis bacteremia.  Originally went to the ED at [facility] , [place] with complaints of nausea vomiting fever urinary frequency, he was treated for acute diverticulitis and discharged home on 11/1.  He returned on 11/6 to the hospital because his blood culture was positive  Staphylococcus epidermis.  ID was consulted.  They thought the source of the infection was true skin contamination.  Patient was treated with vancomycin and  ertapenem and discharged home on  Augmentin 875-125 mg twice daily for additional 7 days to complete antibiotic course for diverticulitis.  He was encouraged to follow-up outpatient with GI.  Was discharged on November 9.  Currently denies fever, chills, abdominal pain, nausea, vomiting, constipation.  He stated that he has called [facility] to request his medical records he is interested in knowing how the infection occurred and which staff was involved.    Hypertension.  Blood pressure appears labile while in the hospital, patient stated that his blood pressure has been well controlled at home, he was not able to provide specific readings for his blood pressure.  He currently denies headache, dizziness, syncope.  He stated that he has not taken his blood pressure medications today.  He is ready to return to work and would have disability and leave provider statement forms completed.     Due for flu vaccine patient declined flu vaccine in the office  today              Past Medical History:  Diagnosis Date   Acute hypoxemic respiratory failure due to COVID-19 ([place]) 09/30/2019   AKI (acute kidney injury) ([place])    Diabetes mellitus without complication ([place])    Hypertension    Nephrolithiasis    Pneumonia due to COVID-19 virus 09/30/2019   Sigmoid diverticulitis 09/04/2021    Past Surgical History:  Procedure Laterality Date   WISDOM TOOTH EXTRACTION      Family History  Problem Relation Age of Onset   Cancer Mother    Hypertension Other    Diabetes Other    Colon polyps Neg Hx    Colon cancer Neg Hx    Esophageal cancer Neg Hx    Rectal cancer Neg Hx    Stomach cancer Neg Hx     Social History   Socioeconomic History   Marital status: Married    Spouse name: Not on file   Number of children: Not on file   Years of education: Not on file   Highest education level: Not on file  Occupational History   Not on file  Tobacco Use   Smoking status: Never   Smokeless tobacco: Never  Vaping Use   Vaping Use: Never used  Substance and Sexual Activity   Alcohol use: No   Drug use: No   Sexual activity: Not on file  Other Topics Concern   Not on file  Social History Narrative   Not on file   Social Determinants of Health  Financial Resource Strain: Not on file  Food Insecurity: Not on file  Transportation Needs: Not on file  Physical Activity: Not on file  Stress: Not on file  Social Connections: Not on file  Intimate Partner Violence: Not on file    Outpatient Medications Prior to Visit  Medication Sig Dispense Refill   Accu-Chek FastClix Lancets MISC Use to test blood sugar twice a day 100 each 1   Acetaminophen (TYLENOL EXTRA STRENGTH PO) Take 500 mg by mouth.     amLODipine (NORVASC) 10 MG tablet Take 1 tablet (10 mg total) by mouth daily. 90 tablet 1   atorvastatin (LIPITOR) 20 MG tablet Take 1 tablet (20 mg total) by mouth daily. 90 tablet 1   Blood Glucose Monitoring Suppl (ACCU-CHEK  GUIDE) w/Device KIT Test blood sugar twice a day 1 kit 0   carvedilol (COREG) 25 MG tablet Take 1 tablet (25 mg total) by mouth 2 (two) times daily with a meal. 120 tablet 1   Dulaglutide (TRULICITY) 3 CN/4.7SJ SOPN ADMINISTER 3 MG(0.5 ML) UNDER THE SKIN 1 TIME A WEEK AS DIRECTED 2 mL 2   empagliflozin (JARDIANCE) 10 MG TABS tablet Take 1 tablet (10 mg total) by mouth daily before breakfast. 30 tablet 3   glucose blood (ACCU-CHEK GUIDE) test strip Use as instructed 100 each 12   hydrALAZINE (APRESOLINE) 100 MG tablet TAKE 1 TABLET(100 MG) BY MOUTH THREE TIMES DAILY 270 tablet 1   Insulin Pen Needle (BD ULTRA-FINE PEN NEEDLES) 29G X 12.7MM MISC Use with Lantus Solostar pen nightly 100 each 2   Lancets Misc. (ACCU-CHEK FASTCLIX LANCET) KIT Use to test blood sugar twice a day 1 kit 0   metFORMIN (GLUCOPHAGE-XR) 500 MG 24 hr tablet Take 2 tablets (1,000 mg total) by mouth in the morning and at bedtime. 360 tablet 1   Potassium Citrate 15 MEQ (1620 MG) TBCR      valsartan (DIOVAN) 160 MG tablet Take 1 tablet (160 mg total) by mouth daily. 90 tablet 3   insulin glargine (LANTUS SOLOSTAR) 100 UNIT/ML Solostar Pen Inject 45 Units into the skin at bedtime. (Patient not taking: Reported on 01/02/2022) 15 mL 4   Multiple Vitamin (MULTIVITAMIN WITH MINERALS) TABS tablet Take 1 tablet by mouth daily. (Patient not taking: Reported on 01/02/2022)     traMADol (ULTRAM) 50 MG tablet Take 50 mg by mouth every 6 (six) hours as needed. (Patient not taking: Reported on 01/02/2022)     No facility-administered medications prior to visit.    Allergies  Allergen Reactions   Shellfish Allergy Anaphylaxis and Swelling    ROS Review of Systems  Constitutional: Negative.  Negative for activity change, appetite change, chills, fatigue and fever.  Respiratory:  Negative for chest tightness, shortness of breath and wheezing.   Cardiovascular: Negative.  Negative for chest pain, palpitations and leg swelling.   Gastrointestinal:  Negative for abdominal distention, abdominal pain, anal bleeding, blood in stool, constipation, diarrhea, nausea and vomiting.  Genitourinary: Negative.   Skin: Negative.   Neurological:  Negative for dizziness, facial asymmetry, light-headedness, numbness and headaches.  Psychiatric/Behavioral:  Negative for self-injury, sleep disturbance and suicidal ideas. The patient is not nervous/anxious.       Objective:    Physical Exam Constitutional:      General: He is not in acute distress.    Appearance: He is obese. He is not ill-appearing, toxic-appearing or diaphoretic.  Eyes:     General: No scleral icterus.  Right eye: No discharge.        Left eye: No discharge.     Extraocular Movements: Extraocular movements intact.     Conjunctiva/sclera: Conjunctivae normal.     Pupils: Pupils are equal, round, and reactive to light.  Cardiovascular:     Rate and Rhythm: Normal rate and regular rhythm.     Pulses: Normal pulses.     Heart sounds: Normal heart sounds. No murmur heard.    No friction rub. No gallop.  Pulmonary:     Effort: No respiratory distress.     Breath sounds: No stridor. No wheezing, rhonchi or rales.  Chest:     Chest wall: No tenderness.  Abdominal:     General: There is no distension.     Palpations: Abdomen is soft.     Tenderness: There is no abdominal tenderness. There is no guarding.  Musculoskeletal:        General: No deformity.     Right lower leg: No edema.     Left lower leg: No edema.  Skin:    General: Skin is warm and dry.     Capillary Refill: Capillary refill takes less than 2 seconds.  Neurological:     Mental Status: He is alert and oriented to person, place, and time.     Cranial Nerves: No cranial nerve deficit.     Sensory: No sensory deficit.     Motor: No weakness.     Coordination: Coordination normal.  Psychiatric:        Mood and Affect: Mood normal.        Behavior: Behavior normal.        Thought  Content: Thought content normal.        Judgment: Judgment normal.     BP (!) 143/102   Pulse 90   Temp 97.9 F (36.6 C)   Ht _0  (1.753 m)   Wt (!) 309 lb (140.2 kg)   SpO2 98%   BMI 45.63 kg/m  Wt Readings from Last 3 Encounters:  01/02/22 (!) 309 lb (140.2 kg)  09/26/21 (!) 314 lb 3.2 oz (142.5 kg)  07/05/21 (!) 329 lb (149.2 kg)    No results found for: "TSH" Lab Results  Component Value Date   WBC 6.0 07/05/2021   HGB 13.3 07/05/2021   HCT 41.2 07/05/2021   MCV 85 07/05/2021   PLT 278 07/05/2021   Lab Results  Component Value Date   NA 138 07/05/2021   K 4.1 07/05/2021   CO2 22 07/05/2021   GLUCOSE 185 (H) 07/05/2021   BUN 16 07/05/2021   CREATININE 0.97 07/05/2021   BILITOT 0.3 07/05/2021   ALKPHOS 70 07/05/2021   AST 13 07/05/2021   ALT 19 07/05/2021   PROT 8.0 07/05/2021   ALBUMIN 4.9 07/05/2021   CALCIUM 9.6 07/05/2021   ANIONGAP 7 10/03/2019   EGFR 97 07/05/2021   Lab Results  Component Value Date   CHOL 208 (H) 02/01/2020   Lab Results  Component Value Date   HDL 48 02/01/2020   Lab Results  Component Value Date   LDLCALC 135 (H) 02/01/2020   Lab Results  Component Value Date   TRIG 139 02/01/2020   Lab Results  Component Value Date   CHOLHDL 4.3 02/01/2020   Lab Results  Component Value Date   HGBA1C 6.8 09/26/2021      Assessment & Plan:   Problem List Items Addressed This Visit       Cardiovascular  and Mediastinum   Hypertension    BP Readings from Last 3 Encounters:  01/02/22 (!) 143/102  11/15/21 126/76  09/26/21 128/86  Blood pressure is elevated in the office today, patient reports that he has not taken his medications but he has been taking them consistently at home Currently on amlodipine 10 mg daily, valsartan 160 mg daily, hydralazine 100 mg 3 times daily, carvedilol 25 mg twice daily. Continue current medications DASH diet advised patient encouraged to engage in regular moderate to vigorous exercises at  least 150 minutes weekly. Follow-up with PCP to recheck blood pressure in 4 weeks.        Endocrine   Controlled type 2 diabetes mellitus with microalbuminuria, without long-term current use of insulin (HCC)    Lab Results  Component Value Date   HGBA1C 6.8 09/26/2021  Chronic condition currently well-controlled Is currently on metformin 1000 mg twice daily, Jardiance 10 mg daily, Trulicity 3 mg once weekly Continue current medications Avoid sugar sweets soda Follow-up with PCP as planned        Other   Diverticulitis    Recently had acute flareup of diverticulitis on November 1. He has completed full course of antibiotics ordered Currently denies nausea vomiting abdominal pain fever chills. Patient was encouraged to follow-up with GI as planned      Staphylococcus epidermidis infection - Primary    Was on admission at [facility] in [facility] from November 6 to November 9. He was treated with IV antibiotics while in the hospital was discharged home on Augmentin. Patient denies fever, chills malaise. However is interested in knowing which staff drew his blood while he was at the hospital stated that he has requested for this information from the [facility] discharge follow-up     presents for follow-up for hospital admission for Staphylococcus epidermis bacteremia.  Originally went to the ED with complaints of nausea vomiting fever urinary frequency, he was treated for acute diverticulitis and discharged home on 11/1.  He returned on 11/6 to the hospital because his blood culture was positive  Staphylococcus epidermis.  ID was consulted.  They thought the source of the infection was true skin contamination.  Patient was treated with vancomycin and  ertapenem and discharged home on  Augmentin 875-125 mg twice daily for additional 7 days to complete antibiotic course for diverticulitis.  He was encouraged to follow-up outpatient with GI.  Was discharged on November 9.   Currently denies fever, chills, abdominal pain, nausea, vomiting, constipation.  He stated that he has called [facility] to request his medical records he is interested in knowing how the infection occurred and which staff was involved.   After visit summary and labs were reviewed by me today Patient was encouraged to follow-up with GI as planned       No orders of the defined types were placed in this encounter.   Follow-up: No follow-ups on file.    Renee Rival, FNP

## 2022-01-02 NOTE — Assessment & Plan Note (Signed)
Lab Results  Component Value Date   HGBA1C 6.8 09/26/2021  Chronic condition currently well-controlled Is currently on metformin 1000 mg twice daily, Jardiance 10 mg daily, Trulicity 3 mg once weekly Continue current medications Avoid sugar sweets soda Follow-up with PCP as planned

## 2022-01-02 NOTE — Progress Notes (Signed)
See note above

## 2022-01-16 ENCOUNTER — Encounter: Payer: Self-pay | Admitting: Critical Care Medicine

## 2022-01-29 ENCOUNTER — Ambulatory Visit: Payer: 59 | Admitting: Critical Care Medicine

## 2022-01-30 ENCOUNTER — Ambulatory Visit (INDEPENDENT_AMBULATORY_CARE_PROVIDER_SITE_OTHER): Payer: 59 | Admitting: Primary Care

## 2022-01-30 ENCOUNTER — Encounter (INDEPENDENT_AMBULATORY_CARE_PROVIDER_SITE_OTHER): Payer: Self-pay | Admitting: Primary Care

## 2022-01-30 VITALS — BP 157/107 | HR 86 | Ht 69.0 in | Wt 316.6 lb

## 2022-01-30 DIAGNOSIS — Z09 Encounter for follow-up examination after completed treatment for conditions other than malignant neoplasm: Secondary | ICD-10-CM | POA: Diagnosis not present

## 2022-01-30 DIAGNOSIS — Z760 Encounter for issue of repeat prescription: Secondary | ICD-10-CM

## 2022-01-30 DIAGNOSIS — I152 Hypertension secondary to endocrine disorders: Secondary | ICD-10-CM

## 2022-01-30 DIAGNOSIS — E785 Hyperlipidemia, unspecified: Secondary | ICD-10-CM | POA: Insufficient documentation

## 2022-01-30 DIAGNOSIS — E1129 Type 2 diabetes mellitus with other diabetic kidney complication: Secondary | ICD-10-CM | POA: Diagnosis not present

## 2022-01-30 DIAGNOSIS — R809 Proteinuria, unspecified: Secondary | ICD-10-CM

## 2022-01-30 MED ORDER — HYDRALAZINE HCL 100 MG PO TABS: ORAL_TABLET | ORAL | 1 refills | Status: DC

## 2022-01-30 MED ORDER — CARVEDILOL 25 MG PO TABS: 25.0000 mg | ORAL_TABLET | Freq: Two times a day (BID) | ORAL | 1 refills | Status: DC

## 2022-01-30 MED ORDER — VALSARTAN 160 MG PO TABS: 160.0000 mg | ORAL_TABLET | Freq: Every day | ORAL | 3 refills | Status: DC

## 2022-01-30 MED ORDER — LANTUS SOLOSTAR 100 UNIT/ML ~~LOC~~ SOPN: 25.0000 [IU] | PEN_INJECTOR | Freq: Every day | SUBCUTANEOUS | 4 refills | Status: DC

## 2022-01-30 MED ORDER — AMLODIPINE BESYLATE 10 MG PO TABS: 10.0000 mg | ORAL_TABLET | Freq: Every day | ORAL | 1 refills | Status: DC

## 2022-01-30 MED ORDER — TRULICITY 3 MG/0.5ML ~~LOC~~ SOAJ: SUBCUTANEOUS | 2 refills | Status: DC

## 2022-01-30 NOTE — Progress Notes (Signed)
[facility]   Subjective:   [name] is a 46 y.o. male presents for hospital follow up and establish care. Admit date to the hospital was 12/17/21 for UTI and Sigmoid diverticulitis .  Patient followed by Dr. Joya Gaskins and will schedule follow-up visit with him.Patient has No headache, No chest pain, No abdominal pain - No Nausea, No new weakness tingling or numbness, No Cough - shortness of breath . Denies any signs or symptoms of diabetes-polyuria, polyphagia, polydipsia or vision changes.  Needing medication refills Past Medical History:  Diagnosis Date   Acute hypoxemic respiratory failure due to COVID-19 ([place]) 09/30/2019   AKI (acute kidney injury) ([place])    Diabetes mellitus without complication ([place])    Hypertension    Nephrolithiasis    Pneumonia due to COVID-19 virus 09/30/2019   Sigmoid diverticulitis 09/04/2021     Allergies  Allergen Reactions   Shellfish Allergy Anaphylaxis and Swelling      Current Outpatient Medications on File Prior to Visit  Medication Sig Dispense Refill   Accu-Chek FastClix Lancets MISC Use to test blood sugar twice a day 100 each 1   Acetaminophen (TYLENOL EXTRA STRENGTH PO) Take 500 mg by mouth.     atorvastatin (LIPITOR) 20 MG tablet Take 1 tablet (20 mg total) by mouth daily. 90 tablet 1   Blood Glucose Monitoring Suppl (ACCU-CHEK GUIDE) w/Device KIT Test blood sugar twice a day 1 kit 0   empagliflozin (JARDIANCE) 10 MG TABS tablet Take 1 tablet (10 mg total) by mouth daily before breakfast. 30 tablet 3   glucose blood (ACCU-CHEK GUIDE) test strip Use as instructed 100 each 12   Insulin Pen Needle (BD ULTRA-FINE PEN NEEDLES) 29G X 12.7MM MISC Use with Lantus Solostar pen nightly 100 each 2   Lancets Misc. (ACCU-CHEK FASTCLIX LANCET) KIT Use to test blood sugar twice a day 1 kit 0   Potassium Citrate 15 MEQ (1620 MG) TBCR      Multiple Vitamin (MULTIVITAMIN WITH MINERALS) TABS tablet Take 1 tablet by mouth daily. (Patient  not taking: Reported on 01/02/2022)     traMADol (ULTRAM) 50 MG tablet Take 50 mg by mouth every 6 (six) hours as needed. (Patient not taking: Reported on 01/02/2022)     No current facility-administered medications on file prior to visit.     Review of System: Comprehensive ROS Pertinent positive and negative noted in HPI    Objective:  Blood Pressure (Abnormal) 157/107   Pulse 86   Height 5' 9" (1.753 m)   Weight (Abnormal) 316 lb 9.6 oz (143.6 kg)   Oxygen Saturation 98%   Body Mass Index 46.75 kg/m   Filed Weights   01/30/22 1026  Weight: (Abnormal) 316 lb 9.6 oz (143.6 kg)    Physical Exam: General Appearance: Well nourished, in no apparent distress. Eyes: PERRLA, EOMs, conjunctiva no swelling or erythema Sinuses: No Frontal/maxillary tenderness ENT/Mouth: Ext aud canals clear, TMs without erythema, bulging. No erythema, swelling, or exudate on post pharynx.  Tonsils not swollen or erythematous. Hearing normal.  Neck: Supple, thyroid normal.  Respiratory: Respiratory effort normal, BS equal bilaterally without rales, rhonchi, wheezing or stridor.  Cardio: RRR with no MRGs. Brisk peripheral pulses without edema.  Abdomen: Soft, + BS.  Non tender, no guarding, rebound, hernias, masses. Lymphatics: Non tender without lymphadenopathy.  Musculoskeletal: Full ROM, 5/5 strength, normal gait.  Skin: Warm, dry without rashes, lesions, ecchymosis.  Neuro: Cranial nerves intact. Normal muscle tone, no cerebellar symptoms. Sensation intact.  Psych: Awake and oriented X 3, normal affect, Insight and Judgment appropriate.    Assessment:  Jason Bruce was seen today for diabetes and hypertension.  Diagnoses and all orders for this visit:  Hospital discharge follow-up  Controlled type 2 diabetes mellitus with microalbuminuria, without long-term current use of insulin (HCC) A1c 6.4 - educated on lifestyle modifications, including but not limited to diet choices and adding exercise to  daily routine.    Hypertension due to endocrine disorder BP goal - < 130/80 Explained that having normal blood pressure is the goal and medications are helping to get to goal and maintain normal blood pressure. DIET: Limit salt intake, read nutrition labels to check salt content, limit fried and high fatty foods  Avoid using multisymptom OTC cold preparations that generally contain sudafed which can rise BP. Consult with pharmacist on best cold relief products to use for persons with HTN EXERCISE Discussed incorporating exercise such as walking - 30 minutes most days of the week and can do in 10 minute intervals     Other orders/medication refills and changes -     valsartan (DIOVAN) 160 MG tablet; Take 1 tablet (160 mg total) by mouth daily. -     hydrALAZINE (APRESOLINE) 100 MG tablet; TAKE 1 TABLET(100 MG) BY MOUTH THREE TIMES DAILY -     Dulaglutide (TRULICITY) 3 ON/6.2XB SOPN; ADMINISTER 3 MG(0.5 ML) UNDER THE SKIN 1 TIME A WEEK AS DIRECTED -     amLODipine (NORVASC) 10 MG tablet; Take 1 tablet (10 mg total) by mouth daily. -     carvedilol (COREG) 25 MG tablet; Take 1 tablet (25 mg total) by mouth 2 (two) times daily with a meal. -     insulin glargine (LANTUS SOLOSTAR) 100 UNIT/ML Solostar Pen; Inject 25 Units into the skin at bedtime.      Meds ordered this encounter  Medications   valsartan (DIOVAN) 160 MG tablet    Sig: Take 1 tablet (160 mg total) by mouth daily.    Dispense:  90 tablet    Refill:  3    Order Specific Question:   Supervising Provider    Answer:   Tresa Garter [[id number]]   hydrALAZINE (APRESOLINE) 100 MG tablet    Sig: TAKE 1 TABLET(100 MG) BY MOUTH THREE TIMES DAILY    Dispense:  270 tablet    Refill:  1    Order Specific Question:   Supervising Provider    Answer:   Tresa Garter [[id number]]   Dulaglutide (TRULICITY) 3 ZD/6.6YQ SOPN    Sig: ADMINISTER 3 MG(0.5 ML) UNDER THE SKIN 1 TIME A WEEK AS DIRECTED    Dispense:  2 mL    Refill:  2     Order Specific Question:   Supervising Provider    Answer:   Tresa Garter [[id number]]   amLODipine (NORVASC) 10 MG tablet    Sig: Take 1 tablet (10 mg total) by mouth daily.    Dispense:  90 tablet    Refill:  1    Order Specific Question:   Supervising Provider    Answer:   Tresa Garter [[id number]]   carvedilol (COREG) 25 MG tablet    Sig: Take 1 tablet (25 mg total) by mouth 2 (two) times daily with a meal.    Dispense:  120 tablet    Refill:  1    Order Specific Question:   Supervising Provider    Answer:   Tresa Garter [[id number]]  insulin glargine (LANTUS SOLOSTAR) 100 UNIT/ML Solostar Pen    Sig: Inject 25 Units into the skin at bedtime.    Dispense:  15 mL    Refill:  4    Order Specific Question:   Supervising Provider    Answer:   Tresa Garter [[id number]]    This note has been created with Dragon speech recognition software and smart phrase technology. Any transcriptional errors are unintentional.   Kerin Perna, NP 01/30/2022, 11:34 AM

## 2022-02-27 ENCOUNTER — Ambulatory Visit (INDEPENDENT_AMBULATORY_CARE_PROVIDER_SITE_OTHER): Payer: 59

## 2022-02-27 VITALS — BP 137/83

## 2022-02-27 DIAGNOSIS — Z0130 Encounter for examination of blood pressure without abnormal findings: Secondary | ICD-10-CM

## 2022-03-11 ENCOUNTER — Ambulatory Visit: Payer: 59 | Admitting: Pharmacist

## 2022-04-03 NOTE — Progress Notes (Deleted)
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 PMH is significant for HTN, OSA, diverticulitis, obesity, and T2DM.    Patient was referred and last seen by Primary Care Provider, Juluis Mire, on 01/30/2022.   At last visit, BP was elevated and the patient was instructed to follow up with the clinical pharmacist. No medication changes at that time.   Today, patient arrives in *** spirits and presents without *** assistance. *** Denies dizziness, headache, blurred vision, swelling.   Patient reports hypertension was diagnosed in ***.   Family/Social History:  -Fhx: HTN, DM, and cancer -Tobacco: denies -Alcohol: denies  Medication adherence *** . Patient has *** taken BP medications today.   Current antihypertensives include: amlodipine 30m once daily, carvedilol 268mBID, hydralazine 10030mID, valsartan 160m99mce daily  Reported home BP readings: ***  Patient reported dietary habits: Eats *** meals/day Breakfast: *** Lunch: *** Dinner: *** Snacks: *** Drinks: ***  Patient-reported exercise habits: ***  ASCVD risk factors include: ***  O:  ROS  Physical Exam  Last 3 Office BP readings: BP Readings from Last 3 Encounters:  02/27/22 137/83  01/30/22 (!) 157/107  01/02/22 (!) 143/102    BMET    Component Value Date/Time   NA 138 07/05/2021 0930   NA 142 08/11/2011 2105   K 4.1 07/05/2021 0930   K 4.2 08/11/2011 2105   CL 99 07/05/2021 0930   CL 104 08/11/2011 2105   CO2 22 07/05/2021 0930   CO2 30 08/11/2011 2105   GLUCOSE 185 (H) 07/05/2021 0930   GLUCOSE 178 (H) 10/03/2019 0416   GLUCOSE 102 (H) 08/11/2011 2105   BUN 16 07/05/2021 0930   BUN 14 08/11/2011 2105   CREATININE 0.97 07/05/2021 0930   CREATININE 1.06 08/11/2011 2105   CALCIUM 9.6 07/05/2021 0930   CALCIUM 9.2 08/11/2011 2105   GFRNONAA 103 11/22/2019 1208   GFRNONAA >60 08/11/2011 2105   GFRAA 119  11/22/2019 1208   GFRAA >60 08/11/2011 2105    Renal function: CrCl cannot be calculated (Patient's most recent lab result is older than the maximum 21 days allowed.).  Clinical ASCVD: No  The 10-year ASCVD risk score (Arnett DK, et al., 2019) is: 16.9%   Values used to calculate the score:     Age: 76 y59rs     Sex: Male     Is Non-Hispanic African American: Yes     Diabetic: Yes     Tobacco smoker: No     Systolic Blood Pressure: 137 0000000g     Is BP treated: Yes     HDL Cholesterol: 48 mg/dL     Total Cholesterol: 208 mg/dL   A/P: Hypertension diagnosed *** currently *** on current medications. BP goal < 130/80 *** mmHg. Medication adherence appears ***. Control is suboptimal due to ***.  -{Meds adjust:18428} ***.  -{Meds adjust:18428} ***.  -Patient educated on purpose, proper use, and potential adverse effects of ***.  -F/u labs ordered - *** -Counseled on lifestyle modifications for blood pressure control including reduced dietary sodium, increased exercise, adequate sleep. -Encouraged patient to check BP at home and bring log of readings to next visit. Counseled on proper use of home BP cuff.   Results reviewed and written information provided.    Written patient instructions provided. Patient verbalized understanding of treatment plan.  Total time in face to face counseling *** minutes.  Follow-up:  Pharmacist ***. PCP clinic visit in 05/01/22  Maryan Puls, PharmD PGY-1 [facility] Resident

## 2022-04-04 ENCOUNTER — Ambulatory Visit: Payer: 59 | Admitting: Pharmacist

## 2022-04-12 DIAGNOSIS — Z419 Encounter for procedure for purposes other than remedying health state, unspecified: Secondary | ICD-10-CM | POA: Diagnosis not present

## 2022-05-01 ENCOUNTER — Other Ambulatory Visit: Payer: Self-pay | Admitting: Pharmacist

## 2022-05-01 ENCOUNTER — Encounter (INDEPENDENT_AMBULATORY_CARE_PROVIDER_SITE_OTHER): Payer: Self-pay | Admitting: Primary Care

## 2022-05-01 ENCOUNTER — Other Ambulatory Visit: Payer: Self-pay

## 2022-05-01 ENCOUNTER — Ambulatory Visit (INDEPENDENT_AMBULATORY_CARE_PROVIDER_SITE_OTHER): Payer: Medicaid Other | Admitting: Primary Care

## 2022-05-01 VITALS — BP 171/127 | HR 91 | Resp 16 | Ht 69.0 in | Wt 330.2 lb

## 2022-05-01 DIAGNOSIS — Z6841 Body Mass Index (BMI) 40.0 and over, adult: Secondary | ICD-10-CM | POA: Diagnosis not present

## 2022-05-01 DIAGNOSIS — R809 Proteinuria, unspecified: Secondary | ICD-10-CM

## 2022-05-01 DIAGNOSIS — Z760 Encounter for issue of repeat prescription: Secondary | ICD-10-CM

## 2022-05-01 DIAGNOSIS — E1129 Type 2 diabetes mellitus with other diabetic kidney complication: Secondary | ICD-10-CM

## 2022-05-01 DIAGNOSIS — I152 Hypertension secondary to endocrine disorders: Secondary | ICD-10-CM

## 2022-05-01 MED ORDER — TRULICITY 0.75 MG/0.5ML ~~LOC~~ SOAJ: 0.7500 mg | SUBCUTANEOUS | 0 refills | Status: DC

## 2022-05-01 MED ORDER — TRULICITY 0.75 MG/0.5ML ~~LOC~~ SOAJ
0.7500 mg | SUBCUTANEOUS | 0 refills | Status: DC
  Filled 2022-05-01: qty 2, 28d supply, fill #0

## 2022-05-01 MED ORDER — TRULICITY 1.5 MG/0.5ML ~~LOC~~ SOAJ
1.5000 mg | SUBCUTANEOUS | 1 refills | Status: DC
  Filled 2022-05-01: qty 6, 84d supply, fill #0

## 2022-05-01 MED ORDER — TRULICITY 1.5 MG/0.5ML ~~LOC~~ SOAJ: 1.5000 mg | SUBCUTANEOUS | 1 refills | Status: DC

## 2022-05-01 NOTE — Progress Notes (Signed)
[place]  Jason Bruce, is a 47 y.o. male  [id number]  [id number]  DOB - Feb 01, 1976  Chief Complaint  Patient presents with   Diabetes   Hypertension    Pt hasn't taken bp medication this morning        Subjective:   Jason Bruce is a 47 y.o. Morbid obese male here today for a follow up visit for management of hypertension. Patient has No headache, No chest pain, No abdominal pain - No Nausea, No new weakness tingling or numbness, No Cough - shortness of breath.  Type 2 diabetes endorses polyuria with nocturia, polydipsia  denies polyphasia or vision changes.  He was rushing this morning and did not take his Bp meds - stop drinking brown drinks (sodas) clear sugar free soda. Increase weight gain due to grieving and morning over the death of a close family member. Patient has not been able to take Trulicity due to a back order from [place]. Spoke with patient avocate she will transfer Trulicity to [facility]. No problems updated.  Allergies  Allergen Reactions   Shellfish Allergy Anaphylaxis and Swelling    Past Medical History:  Diagnosis Date   Acute hypoxemic respiratory failure due to COVID-19 ([place]) 09/30/2019   AKI (acute kidney injury) ([place])    Diabetes mellitus without complication ([place])    Hypertension    Nephrolithiasis    Pneumonia due to COVID-19 virus 09/30/2019   Sigmoid diverticulitis 09/04/2021    Current Outpatient Medications on File Prior to Visit  Medication Sig Dispense Refill   Accu-Chek FastClix Lancets MISC Use to test blood sugar twice a day 100 each 1   Acetaminophen (TYLENOL EXTRA STRENGTH PO) Take 500 mg by mouth.     amLODipine (NORVASC) 10 MG tablet Take 1 tablet (10 mg total) by mouth daily. 90 tablet 1   atorvastatin (LIPITOR) 20 MG tablet Take 1 tablet (20 mg total) by mouth daily. 90 tablet 1   Blood Glucose Monitoring Suppl (ACCU-CHEK GUIDE) w/Device KIT Test blood sugar twice a day 1 kit 0    carvedilol (COREG) 25 MG tablet Take 1 tablet (25 mg total) by mouth 2 (two) times daily with a meal. 120 tablet 1   Dulaglutide (TRULICITY) 3 0000000 SOPN ADMINISTER 3 MG(0.5 ML) UNDER THE SKIN 1 TIME A WEEK AS DIRECTED 2 mL 2   empagliflozin (JARDIANCE) 10 MG TABS tablet Take 1 tablet (10 mg total) by mouth daily before breakfast. 30 tablet 3   glucose blood (ACCU-CHEK GUIDE) test strip Use as instructed 100 each 12   hydrALAZINE (APRESOLINE) 100 MG tablet TAKE 1 TABLET(100 MG) BY MOUTH THREE TIMES DAILY 270 tablet 1   insulin glargine (LANTUS SOLOSTAR) 100 UNIT/ML Solostar Pen Inject 25 Units into the skin at bedtime. 15 mL 4   Insulin Pen Needle (BD ULTRA-FINE PEN NEEDLES) 29G X 12.7MM MISC Use with Lantus Solostar pen nightly 100 each 2   Lancets Misc. (ACCU-CHEK FASTCLIX LANCET) KIT Use to test blood sugar twice a day 1 kit 0   Multiple Vitamin (MULTIVITAMIN WITH MINERALS) TABS tablet Take 1 tablet by mouth daily. (Patient not taking: Reported on 01/02/2022)     Potassium Citrate 15 MEQ (1620 MG) TBCR      traMADol (ULTRAM) 50 MG tablet Take 50 mg by mouth every 6 (six) hours as needed. (Patient not taking: Reported on 01/02/2022)     valsartan (DIOVAN) 160 MG tablet Take 1 tablet (160 mg total) by mouth daily. 90 tablet 3  No current facility-administered medications on file prior to visit.    Objective:   Vitals:   05/01/22 1040 05/01/22 1041  BP: (Abnormal) 174/124 (Abnormal) 171/127  Pulse: 91   Resp: 16   SpO2: 96%   Weight: (Abnormal) 330 lb 3.2 oz (149.8 kg)   Height: 5\' 9"  (1.753 m)     Comprehensive ROS Pertinent positive and negative noted in HPI   Exam General appearance : Awake, alert, not in any distress. Speech Clear. Not toxic looking HEENT: Atraumatic and Normocephalic, pupils equally reactive to light and accomodation Neck: Supple, no JVD. No cervical lymphadenopathy.  Chest: Good air entry bilaterally, no added sounds  CVS: S1 S2 regular, no murmurs.   Abdomen: Bowel sounds present, Non tender and not distended with no gaurding, rigidity or rebound. Extremities: B/L Lower Ext shows no edema, both legs are warm to touch Neurology: Awake alert, and oriented X 3,  Non focal Skin: No Rash  Data Review Lab Results  Component Value Date   HGBA1C 6.4 12/17/2021   HGBA1C 6.8 09/26/2021   HGBA1C 11.1 (A) 07/05/2021    Assessment & Plan  Jason Bruce was seen today for diabetes and hypertension.  Diagnoses and all orders for this visit:  Hypertension due to endocrine disorder Blood pressure recheck was 160/92 patient admits to not taking his of BP medicine prior to appointment.  However he does check his blood pressure at home and his systolic ranges from [id number]  and his diastolic ranges from [id number]- wife ck Bp and keeps a record.  BP goal - < 130/80 Explained that having normal blood pressure is the goal and medications are helping to get to goal and maintain normal blood pressure. DIET: Limit salt intake, read nutrition labels to check salt content, limit fried and high fatty foods  Avoid using multisymptom OTC cold preparations that generally contain sudafed which can rise BP. Consult with pharmacist on best cold relief products to use for persons with HTN EXERCISE Discussed incorporating exercise such as walking - 30 minutes most days of the week and can do in 10 minute intervals      Controlled type 2 diabetes mellitus with microalbuminuria, without long-term current use of insulin (HCC) A1C 4 months ago 6.4 . Reck in 3 months since out of medication.   monitor carbohydrates -rice, potatoes, tortillas, breads, pasta, sweets, sodas.  Increase exercising to help maintain appropriate weight.   Medication refill Trulicity      Patient have been counseled extensively about nutrition and exercise. Other issues discussed during this visit include: low cholesterol diet, weight control and daily exercise, foot care, annual eye examinations at  Ophthalmology, importance of adherence with medications and regular follow-up. We also discussed long term complications of uncontrolled diabetes and hypertension.   No follow-ups on file.  The patient was given clear instructions to go to ER or return to medical center if symptoms don't improve, worsen or new problems develop. The patient verbalized understanding. The patient was told to call to get lab results if they haven't heard anything in the next week.   This note has been created with Surveyor, quantity. Any transcriptional errors are unintentional.   Kerin Perna, NP 05/01/2022, 11:08 AM

## 2022-05-01 NOTE — Patient Instructions (Signed)
Calorie Counting for Weight Loss Calories are units of energy. Your body needs a certain number of calories from food to keep going throughout the day. When you eat or drink more calories than your body needs, your body stores the extra calories mostly as fat. When you eat or drink fewer calories than your body needs, your body burns fat to get the energy it needs. Calorie counting means keeping track of how many calories you eat and drink each day. Calorie counting can be helpful if you need to lose weight. If you eat fewer calories than your body needs, you should lose weight. Ask your health care provider what a healthy weight is for you. For calorie counting to work, you will need to eat the right number of calories each day to lose a healthy amount of weight per week. A dietitian can help you figure out how many calories you need in a day and will suggest ways to reach your calorie goal. A healthy amount of weight to lose each week is usually 1-2 lb (0.5-0.9 kg). This usually means that your daily calorie intake should be reduced by 500-750 calories. Eating 1,200-1,500 calories a day can help most women lose weight. Eating 1,500-1,800 calories a day can help most men lose weight. What do I need to know about calorie counting? Work with your health care provider or dietitian to determine how many calories you should get each day. To meet your daily calorie goal, you will need to: Find out how many calories are in each food that you would like to eat. Try to do this before you eat. Decide how much of the food you plan to eat. Keep a food log. Do this by writing down what you ate and how many calories it had. To successfully lose weight, it is important to balance calorie counting with a healthy lifestyle that includes regular activity. Where do I find calorie information?  The number of calories in a food can be found on a Nutrition Facts label. If a food does not have a Nutrition Facts label, try  to look up the calories online or ask your dietitian for help. Remember that calories are listed per serving. If you choose to have more than one serving of a food, you will have to multiply the calories per serving by the number of servings you plan to eat. For example, the label on a package of bread might say that a serving size is 1 slice and that there are 90 calories in a serving. If you eat 1 slice, you will have eaten 90 calories. If you eat 2 slices, you will have eaten 180 calories. How do I keep a food log? After each time that you eat, record the following in your food log as soon as possible: What you ate. Be sure to include toppings, sauces, and other extras on the food. How much you ate. This can be measured in cups, ounces, or number of items. How many calories were in each food and drink. The total number of calories in the food you ate. Keep your food log near you, such as in a pocket-sized notebook or on an app or website on your mobile phone. Some programs will calculate calories for you and show you how many calories you have left to meet your daily goal. What are some portion-control tips? Know how many calories are in a serving. This will help you know how many servings you can have of a certain   food. Use a measuring cup to measure serving sizes. You could also try weighing out portions on a kitchen scale. With time, you will be able to estimate serving sizes for some foods. Take time to put servings of different foods on your favorite plates or in your favorite bowls and cups so you know what a serving looks like. Try not to eat straight from a food's packaging, such as from a bag or box. Eating straight from the package makes it hard to see how much you are eating and can lead to overeating. Put the amount you would like to eat in a cup or on a plate to make sure you are eating the right portion. Use smaller plates, glasses, and bowls for smaller portions and to prevent  overeating. Try not to multitask. For example, avoid watching TV or using your computer while eating. If it is time to eat, sit down at a table and enjoy your food. This will help you recognize when you are full. It will also help you be more mindful of what and how much you are eating. What are tips for following this plan? Reading food labels Check the calorie count compared with the serving size. The serving size may be smaller than what you are used to eating. Check the source of the calories. Try to choose foods that are high in protein, fiber, and vitamins, and low in saturated fat, trans fat, and sodium. Shopping Read nutrition labels while you shop. This will help you make healthy decisions about which foods to buy. Pay attention to nutrition labels for low-fat or fat-free foods. These foods sometimes have the same number of calories or more calories than the full-fat versions. They also often have added sugar, starch, or salt to make up for flavor that was removed with the fat. Make a grocery list of lower-calorie foods and stick to it. Cooking Try to cook your favorite foods in a healthier way. For example, try baking instead of frying. Use low-fat dairy products. Meal planning Use more fruits and vegetables. One-half of your plate should be fruits and vegetables. Include lean proteins, such as chicken, turkey, and fish. Lifestyle Each week, aim to do one of the following: 150 minutes of moderate exercise, such as walking. 75 minutes of vigorous exercise, such as running. General information Know how many calories are in the foods you eat most often. This will help you calculate calorie counts faster. Find a way of tracking calories that works for you. Get creative. Try different apps or programs if writing down calories does not work for you. What foods should I eat?  Eat nutritious foods. It is better to have a nutritious, high-calorie food, such as an avocado, than a food with  few nutrients, such as a bag of potato chips. Use your calories on foods and drinks that will fill you up and will not leave you hungry soon after eating. Examples of foods that fill you up are nuts and nut butters, vegetables, lean proteins, and high-fiber foods such as whole grains. High-fiber foods are foods with more than 5 g of fiber per serving. Pay attention to calories in drinks. Low-calorie drinks include water and unsweetened drinks. The items listed above may not be a complete list of foods and beverages you can eat. Contact a dietitian for more information. What foods should I limit? Limit foods or drinks that are not good sources of vitamins, minerals, or protein or that are high in unhealthy fats. These   include: Candy. Other sweets. Sodas, specialty coffee drinks, alcohol, and juice. The items listed above may not be a complete list of foods and beverages you should avoid. Contact a dietitian for more information. How do I count calories when eating out? Pay attention to portions. Often, portions are much larger when eating out. Try these tips to keep portions smaller: Consider sharing a meal instead of getting your own. If you get your own meal, eat only half of it. Before you start eating, ask for a container and put half of your meal into it. When available, consider ordering smaller portions from the menu instead of full portions. Pay attention to your food and drink choices. Knowing the way food is cooked and what is included with the meal can help you eat fewer calories. If calories are listed on the menu, choose the lower-calorie options. Choose dishes that include vegetables, fruits, whole grains, low-fat dairy products, and lean proteins. Choose items that are boiled, broiled, grilled, or steamed. Avoid items that are buttered, battered, fried, or served with cream sauce. Items labeled as crispy are usually fried, unless stated otherwise. Choose water, low-fat milk,  unsweetened iced tea, or other drinks without added sugar. If you want an alcoholic beverage, choose a lower-calorie option, such as a glass of wine or light beer. Ask for dressings, sauces, and syrups on the side. These are usually high in calories, so you should limit the amount you eat. If you want a salad, choose a garden salad and ask for grilled meats. Avoid extra toppings such as bacon, cheese, or fried items. Ask for the dressing on the side, or ask for olive oil and vinegar or lemon to use as dressing. Estimate how many servings of a food you are given. Knowing serving sizes will help you be aware of how much food you are eating at restaurants. Where to find more information Centers for Disease Control and Prevention: www.cdc.gov U.S. Department of Agriculture: myplate.gov Summary Calorie counting means keeping track of how many calories you eat and drink each day. If you eat fewer calories than your body needs, you should lose weight. A healthy amount of weight to lose per week is usually 1-2 lb (0.5-0.9 kg). This usually means reducing your daily calorie intake by 500-750 calories. The number of calories in a food can be found on a Nutrition Facts label. If a food does not have a Nutrition Facts label, try to look up the calories online or ask your dietitian for help. Use smaller plates, glasses, and bowls for smaller portions and to prevent overeating. Use your calories on foods and drinks that will fill you up and not leave you hungry shortly after a meal. This information is not intended to replace advice given to you by your health care provider. Make sure you discuss any questions you have with your health care provider. Document Revised: 03/11/2019 Document Reviewed: 03/11/2019 Elsevier Patient Education  2023 Elsevier Inc.  

## 2022-05-13 DIAGNOSIS — Z419 Encounter for procedure for purposes other than remedying health state, unspecified: Secondary | ICD-10-CM | POA: Diagnosis not present

## 2022-05-29 ENCOUNTER — Ambulatory Visit (INDEPENDENT_AMBULATORY_CARE_PROVIDER_SITE_OTHER): Payer: Medicaid Other | Admitting: Primary Care

## 2022-05-29 VITALS — BP 146/83

## 2022-05-29 DIAGNOSIS — R809 Proteinuria, unspecified: Secondary | ICD-10-CM | POA: Diagnosis not present

## 2022-05-29 DIAGNOSIS — Z760 Encounter for issue of repeat prescription: Secondary | ICD-10-CM

## 2022-05-29 DIAGNOSIS — Z0130 Encounter for examination of blood pressure without abnormal findings: Secondary | ICD-10-CM

## 2022-05-29 DIAGNOSIS — E1129 Type 2 diabetes mellitus with other diabetic kidney complication: Secondary | ICD-10-CM | POA: Diagnosis not present

## 2022-05-29 NOTE — Progress Notes (Signed)
[facility]  Jason Bruce, is a 47 y.o. male  [id number]  [id number]  DOB - 12-Oct-1975  Chief Complaint  Patient presents with   Blood Pressure Check       Subjective:   Jason Bruce is a 47 y.o. Morbid obese male here today for a follow up visit for management of hypertension. Patient has No headache, No chest pain, No abdominal pain - No Nausea, No new weakness tingling or numbness, No Cough - shortness of breath.  Type 2 diabetes endorses polyuria with nocturia, polydipsia  denies polyphasia or vision changes.  He was rushing this morning and did not take his Bp meds - stop drinking brown drinks (sodas) clear sugar free soda. Increase weight gain due to grieving and morning over the death of a close family member. Patient has not been able to take Trulicity due to a back order from [place]. Spoke with patient avocate she will transfer Trulicity to [facility]. No problems updated.  Allergies  Allergen Reactions   Shellfish Allergy Anaphylaxis and Swelling    Past Medical History:  Diagnosis Date   Acute hypoxemic respiratory failure due to COVID-19 ([facility]) 09/30/2019   AKI (acute kidney injury) ([facility])    Diabetes mellitus without complication ([facility])    Hypertension    Nephrolithiasis    Pneumonia due to COVID-19 virus 09/30/2019   Sigmoid diverticulitis 09/04/2021    Current Outpatient Medications on File Prior to Visit  Medication Sig Dispense Refill   Accu-Chek FastClix Lancets MISC Use to test blood sugar twice a day 100 each 1   Acetaminophen (TYLENOL EXTRA STRENGTH PO) Take 500 mg by mouth.     amLODipine (NORVASC) 10 MG tablet Take 1 tablet (10 mg total) by mouth daily. 90 tablet 1   atorvastatin (LIPITOR) 20 MG tablet Take 1 tablet (20 mg total) by mouth daily. 90 tablet 1   Blood Glucose Monitoring Suppl (ACCU-CHEK GUIDE) w/Device KIT Test blood sugar twice a day 1 kit 0   carvedilol (COREG) 25 MG tablet Take 1 tablet (25 mg  total) by mouth 2 (two) times daily with a meal. 120 tablet 1   Dulaglutide (TRULICITY) 0.75 MG/0.5ML SOPN Inject 0.75 mg into the skin once a week. For 4 weeks. Then, increase to the 1.5 mg pen and inject once weekly. 2 mL 0   Dulaglutide (TRULICITY) 1.5 MG/0.5ML SOPN Inject 1.5 mg into the skin once a week. 6 mL 1   empagliflozin (JARDIANCE) 10 MG TABS tablet Take 1 tablet (10 mg total) by mouth daily before breakfast. 30 tablet 3   glucose blood (ACCU-CHEK GUIDE) test strip Use as instructed 100 each 12   hydrALAZINE (APRESOLINE) 100 MG tablet TAKE 1 TABLET(100 MG) BY MOUTH THREE TIMES DAILY 270 tablet 1   insulin glargine (LANTUS SOLOSTAR) 100 UNIT/ML Solostar Pen Inject 25 Units into the skin at bedtime. 15 mL 4   Insulin Pen Needle (BD ULTRA-FINE PEN NEEDLES) 29G X 12.7MM MISC Use with Lantus Solostar pen nightly 100 each 2   Lancets Misc. (ACCU-CHEK FASTCLIX LANCET) KIT Use to test blood sugar twice a day 1 kit 0   Multiple Vitamin (MULTIVITAMIN WITH MINERALS) TABS tablet Take 1 tablet by mouth daily. (Patient not taking: Reported on 01/02/2022)     Potassium Citrate 15 MEQ (1620 MG) TBCR      traMADol (ULTRAM) 50 MG tablet Take 50 mg by mouth every 6 (six) hours as needed. (Patient not taking: Reported on 01/02/2022)  valsartan (DIOVAN) 160 MG tablet Take 1 tablet (160 mg total) by mouth daily. 90 tablet 3   No current facility-administered medications on file prior to visit.    Objective:  Blood Pressure (Abnormal) 146/83 (BP Location: Left Arm, Patient Position: Sitting, Cuff Size: Large) Comment (Cuff Size): thigh    Comprehensive ROS Pertinent positive and negative noted in HPI   Exam General appearance : Awake, alert, not in any distress. Speech Clear. Not toxic looking HEENT: Atraumatic and Normocephalic, pupils equally reactive to light and accomodation Neck: Supple, no JVD. No cervical lymphadenopathy.  Chest: Good air entry bilaterally, no added sounds  CVS: S1 S2  regular, no murmurs.  Abdomen: Bowel sounds present, Non tender and not distended with no gaurding, rigidity or rebound. Extremities: B/L Lower Ext shows no edema, both legs are warm to touch Neurology: Awake alert, and oriented X 3,  Non focal Skin: No Rash  Data Review Lab Results  Component Value Date   HGBA1C 6.4 12/17/2021   HGBA1C 6.8 09/26/2021   HGBA1C 11.1 (A) 07/05/2021    Assessment & Plan  Jason Bruce was seen today for diabetes and hypertension.  Diagnoses and all orders for this visit:  Hypertension due to endocrine disorder Blood pressure recheck was 160/92 patient admits to not taking his of BP medicine prior to appointment.  However he does check his blood pressure at home and his systolic ranges from 130-150  and his diastolic ranges from 80-96- wife ck Bp and keeps a record.  BP goal - < 130/80 Explained that having normal blood pressure is the goal and medications are helping to get to goal and maintain normal blood pressure. DIET: Limit salt intake, read nutrition labels to check salt content, limit fried and high fatty foods  Avoid using multisymptom OTC cold preparations that generally contain sudafed which can rise BP. Consult with pharmacist on best cold relief products to use for persons with HTN EXERCISE Discussed incorporating exercise such as walking - 30 minutes most days of the week and can do in 10 minute intervals      Controlled type 2 diabetes mellitus with microalbuminuria, without long-term current use of insulin (HCC) A1C 4 months ago 6.4 . Reck in 3 months since out of medication.   monitor carbohydrates -rice, potatoes, tortillas, breads, pasta, sweets, sodas.  Increase exercising to help maintain appropriate weight.   Medication refill Trulicity      Patient have been counseled extensively about nutrition and exercise. Other issues discussed during this visit include: low cholesterol diet, weight control and daily exercise, foot care, annual  eye examinations at Ophthalmology, importance of adherence with medications and regular follow-up. We also discussed long term complications of uncontrolled diabetes and hypertension.   No follow-ups on file.  The patient was given clear instructions to go to ER or return to medical center if symptoms don't improve, worsen or new problems develop. The patient verbalized understanding. The patient was told to call to get lab results if they haven't heard anything in the next week.   This note has been created with Education officer, environmental. Any transcriptional errors are unintentional.   Grayce Sessions, NP 06/02/2022, 10:24 PM

## 2022-06-12 DIAGNOSIS — Z419 Encounter for procedure for purposes other than remedying health state, unspecified: Secondary | ICD-10-CM | POA: Diagnosis not present

## 2022-07-12 ENCOUNTER — Emergency Department (HOSPITAL_BASED_OUTPATIENT_CLINIC_OR_DEPARTMENT_OTHER)
Admission: EM | Admit: 2022-07-12 | Discharge: 2022-07-12 | Disposition: A | Discharge status: Home / Self Care | Payer: Medicaid Other | Attending: Emergency Medicine | Admitting: Emergency Medicine

## 2022-07-12 ENCOUNTER — Other Ambulatory Visit: Payer: Self-pay

## 2022-07-12 ENCOUNTER — Emergency Department (HOSPITAL_BASED_OUTPATIENT_CLINIC_OR_DEPARTMENT_OTHER): Payer: Medicaid Other

## 2022-07-12 ENCOUNTER — Encounter (HOSPITAL_BASED_OUTPATIENT_CLINIC_OR_DEPARTMENT_OTHER): Payer: Self-pay | Admitting: Emergency Medicine

## 2022-07-12 ENCOUNTER — Other Ambulatory Visit (HOSPITAL_BASED_OUTPATIENT_CLINIC_OR_DEPARTMENT_OTHER): Payer: Self-pay

## 2022-07-12 DIAGNOSIS — K5730 Diverticulosis of large intestine without perforation or abscess without bleeding: Secondary | ICD-10-CM | POA: Insufficient documentation

## 2022-07-12 DIAGNOSIS — K5790 Diverticulosis of intestine, part unspecified, without perforation or abscess without bleeding: Secondary | ICD-10-CM | POA: Diagnosis not present

## 2022-07-12 DIAGNOSIS — N200 Calculus of kidney: Secondary | ICD-10-CM

## 2022-07-12 DIAGNOSIS — Z794 Long term (current) use of insulin: Secondary | ICD-10-CM | POA: Insufficient documentation

## 2022-07-12 DIAGNOSIS — R1032 Left lower quadrant pain: Secondary | ICD-10-CM | POA: Diagnosis not present

## 2022-07-12 DIAGNOSIS — R739 Hyperglycemia, unspecified: Secondary | ICD-10-CM | POA: Diagnosis not present

## 2022-07-12 LAB — CBC WITH DIFFERENTIAL/PLATELET
Abs Immature Granulocytes: 0.01 10*3/uL (ref 0.00–0.07)
Basophils Absolute: 0 10*3/uL (ref 0.0–0.1)
Basophils Relative: 0 %
Eosinophils Absolute: 0.1 10*3/uL (ref 0.0–0.5)
Eosinophils Relative: 1 %
HCT: 40.9 % (ref 39.0–52.0)
Hemoglobin: 13.5 g/dL (ref 13.0–17.0)
Immature Granulocytes: 0 %
Lymphocytes Relative: 31 %
Lymphs Abs: 2.1 10*3/uL (ref 0.7–4.0)
MCH: 28.1 pg (ref 26.0–34.0)
MCHC: 33 g/dL (ref 30.0–36.0)
MCV: 85 fL (ref 80.0–100.0)
Monocytes Absolute: 0.5 10*3/uL (ref 0.1–1.0)
Monocytes Relative: 8 %
Neutro Abs: 4 10*3/uL (ref 1.7–7.7)
Neutrophils Relative %: 60 %
Platelets: 277 10*3/uL (ref 150–400)
RBC: 4.81 MIL/uL (ref 4.22–5.81)
RDW: 13.8 % (ref 11.5–15.5)
WBC: 6.7 10*3/uL (ref 4.0–10.5)
nRBC: 0 % (ref 0.0–0.2)

## 2022-07-12 LAB — URINALYSIS, ROUTINE W REFLEX MICROSCOPIC
Bilirubin Urine: NEGATIVE
Glucose, UA: NEGATIVE mg/dL
Hgb urine dipstick: NEGATIVE
Ketones, ur: NEGATIVE mg/dL
Leukocytes,Ua: NEGATIVE
Nitrite: NEGATIVE
Specific Gravity, Urine: 1.045 — ABNORMAL HIGH (ref 1.005–1.030)
pH: 6 (ref 5.0–8.0)

## 2022-07-12 LAB — COMPREHENSIVE METABOLIC PANEL
ALT: 18 U/L (ref 0–44)
AST: 16 U/L (ref 15–41)
Albumin: 4.5 g/dL (ref 3.5–5.0)
Alkaline Phosphatase: 59 U/L (ref 38–126)
Anion gap: 11 (ref 5–15)
BUN: 21 mg/dL — ABNORMAL HIGH (ref 6–20)
CO2: 27 mmol/L (ref 22–32)
Calcium: 9.6 mg/dL (ref 8.9–10.3)
Chloride: 99 mmol/L (ref 98–111)
Creatinine, Ser: 1.16 mg/dL (ref 0.61–1.24)
GFR, Estimated: >60 mL/min (ref >60)
Glucose, Bld: 208 mg/dL — ABNORMAL HIGH (ref 70–99)
Potassium: 3.8 mmol/L (ref 3.5–5.1)
Sodium: 137 mmol/L (ref 135–145)
Total Bilirubin: 0.5 mg/dL (ref 0.3–1.2)
Total Protein: 8.2 g/dL — ABNORMAL HIGH (ref 6.5–8.1)

## 2022-07-12 LAB — LIPASE, BLOOD: Lipase: 56 U/L — ABNORMAL HIGH (ref 11–51)

## 2022-07-12 MED ORDER — OXYCODONE-ACETAMINOPHEN 5-325 MG PO TABS: 1.0000 | ORAL_TABLET | Freq: Four times a day (QID) | ORAL | 0 refills | Status: DC | PRN

## 2022-07-12 MED ORDER — TAMSULOSIN HCL 0.4 MG PO CAPS: 0.4000 mg | ORAL_CAPSULE | Freq: Every day | ORAL | 0 refills | Status: DC

## 2022-07-12 MED ORDER — ONDANSETRON HCL 4 MG/2ML IJ SOLN
4.0000 mg | Freq: Once | INTRAMUSCULAR | Status: AC
  Administered 2022-07-12: 4 mg via INTRAVENOUS
  Filled 2022-07-12: qty 2

## 2022-07-12 MED ORDER — ONDANSETRON HCL 4 MG PO TABS: 4.0000 mg | ORAL_TABLET | Freq: Three times a day (TID) | ORAL | 0 refills | Status: DC | PRN

## 2022-07-12 MED ORDER — SODIUM CHLORIDE 0.9 % IV BOLUS
1000.0000 mL | Freq: Once | INTRAVENOUS | Status: AC
  Administered 2022-07-12: 1000 mL via INTRAVENOUS

## 2022-07-12 MED ORDER — MORPHINE SULFATE (PF) 4 MG/ML IV SOLN
4.0000 mg | Freq: Once | INTRAVENOUS | Status: AC
  Administered 2022-07-12: 4 mg via INTRAVENOUS
  Filled 2022-07-12: qty 1

## 2022-07-12 MED ORDER — IOHEXOL 300 MG/ML  SOLN
100.0000 mL | Freq: Once | INTRAMUSCULAR | Status: AC | PRN
  Administered 2022-07-12: 100 mL via INTRAVENOUS

## 2022-07-12 NOTE — ED Notes (Signed)
Patient transported to CT 

## 2022-07-12 NOTE — ED Triage Notes (Signed)
Pt arrives pov, slow gait with c/o LLQ pain x ~ 1 week. Denies fever or dysuria

## 2022-07-12 NOTE — ED Provider Notes (Signed)
[facility] EMERGENCY DEPARTMENT AT [facility] Provider Note   CSN: [id number] Arrival date & time: 07/12/22  9562     History  Chief Complaint  Patient presents with   Abdominal Pain    Jason Bruce is a 47 y.o. male with a past medical history of diverticulitis who presents emergency department with concerns for left lower quadrant pain x 1 week.  Notes that pain has been constant.  He had a recent flare of his diverticulitis last year.  His GI specialist is through [place] and he last saw them last year.  He is due to have a colonoscopy this year.  Has had watery nonbloody diarrhea.  His last bowel movement was this morning.  Has tried his old prescription of Augmentin and hydrocodone this morning prior to arrival to the ED.  Denies urinary symptoms, fever, nausea, vomiting, constipation.  The history is provided by the patient. No language interpreter was used.       Home Medications Prior to Admission medications   Medication Sig Start Date End Date Taking? Authorizing Provider  ondansetron (ZOFRAN) 4 MG tablet Take 1 tablet (4 mg total) by mouth every 8 (eight) hours as needed for nausea or vomiting. 07/12/22  Yes Lillar Bianca A, PA-C  oxyCODONE-acetaminophen (PERCOCET/ROXICET) 5-325 MG tablet Take 1 tablet by mouth every 6 (six) hours as needed for severe pain. 07/12/22  Yes Arden Tinoco A, PA-C  tamsulosin (FLOMAX) 0.4 MG CAPS capsule Take 1 capsule (0.4 mg total) by mouth daily. 07/12/22  Yes Kayton Dunaj A, PA-C  Accu-Chek FastClix Lancets MISC Use to test blood sugar twice a day 09/20/20   Storm Frisk, MD  Acetaminophen (TYLENOL EXTRA STRENGTH PO) Take 500 mg by mouth.    [provider]  amLODipine (NORVASC) 10 MG tablet Take 1 tablet (10 mg total) by mouth daily. 01/30/22 01/30/23  Grayce Sessions, NP  atorvastatin (LIPITOR) 20 MG tablet Take 1 tablet (20 mg total) by mouth daily. 07/05/21   Storm Frisk, MD  Blood Glucose Monitoring  Suppl (ACCU-CHEK GUIDE) w/Device KIT Test blood sugar twice a day 10/27/19   Storm Frisk, MD  carvedilol (COREG) 25 MG tablet Take 1 tablet (25 mg total) by mouth 2 (two) times daily with a meal. 01/30/22   Grayce Sessions, NP  Dulaglutide (TRULICITY) 0.75 MG/0.5ML SOPN Inject 0.75 mg into the skin once a week. For 4 weeks. Then, increase to the 1.5 mg pen and inject once weekly. 05/01/22   Hoy Register, MD  Dulaglutide (TRULICITY) 1.5 MG/0.5ML SOPN Inject 1.5 mg into the skin once a week. 05/01/22   Hoy Register, MD  empagliflozin (JARDIANCE) 10 MG TABS tablet Take 1 tablet (10 mg total) by mouth daily before breakfast. 09/26/21   Storm Frisk, MD  glucose blood (ACCU-CHEK GUIDE) test strip Use as instructed 07/05/21   Storm Frisk, MD  hydrALAZINE (APRESOLINE) 100 MG tablet TAKE 1 TABLET(100 MG) BY MOUTH THREE TIMES DAILY 01/30/22   Grayce Sessions, NP  insulin glargine (LANTUS SOLOSTAR) 100 UNIT/ML Solostar Pen Inject 25 Units into the skin at bedtime. 01/30/22   Grayce Sessions, NP  Insulin Pen Needle (BD ULTRA-FINE PEN NEEDLES) 29G X 12.7MM MISC Use with Lantus Solostar pen nightly 07/05/21   Storm Frisk, MD  Lancets Misc. (ACCU-CHEK FASTCLIX LANCET) KIT Use to test blood sugar twice a day 07/05/21   Storm Frisk, MD  Potassium Citrate 15 MEQ (1620 MG) TBCR  11/03/21  [provider]  valsartan (DIOVAN) 160 MG tablet Take 1 tablet (160 mg total) by mouth daily. 01/30/22   Grayce Sessions, NP      Allergies    Shellfish allergy    Review of Systems   Review of Systems  Gastrointestinal:  Positive for abdominal pain.  All other systems reviewed and are negative.   Physical Exam Updated Vital Signs BP (!) 154/117   Pulse 93   Temp 98.3 F (36.8 C) (Oral)   Resp 20   Ht 5\' 9"  (1.753 m)   Wt (!) 138.3 kg   SpO2 96%   BMI 45.04 kg/m  Physical Exam Vitals and nursing note reviewed.  Constitutional:      General: He is not in acute  distress.    Appearance: He is not diaphoretic.  HENT:     Head: Normocephalic and atraumatic.     Mouth/Throat:     Pharynx: No oropharyngeal exudate.  Eyes:     General: No scleral icterus.    Conjunctiva/sclera: Conjunctivae normal.  Cardiovascular:     Rate and Rhythm: Normal rate and regular rhythm.     Pulses: Normal pulses.     Heart sounds: Normal heart sounds.  Pulmonary:     Effort: Pulmonary effort is normal. No respiratory distress.     Breath sounds: Normal breath sounds. No wheezing.  Abdominal:     General: Bowel sounds are normal.     Palpations: Abdomen is soft. There is no mass.     Tenderness: There is abdominal tenderness in the suprapubic area and left lower quadrant. There is no guarding or rebound.     Comments: Tenderness to palpation noted to suprapubic and left lower quadrant region.  Musculoskeletal:        General: Normal range of motion.     Cervical back: Normal range of motion and neck supple.  Skin:    General: Skin is warm and dry.  Neurological:     Mental Status: He is alert.  Psychiatric:        Behavior: Behavior normal.     ED Results / Procedures / Treatments   Labs (all labs ordered are listed, but only abnormal results are displayed) Labs Reviewed  COMPREHENSIVE METABOLIC PANEL - Abnormal; Notable for the following components:      Result Value   Glucose, Bld 208 (*)    BUN 21 (*)    Total Protein 8.2 (*)    All other components within normal limits  LIPASE, BLOOD - Abnormal; Notable for the following components:   Lipase 56 (*)    All other components within normal limits  URINALYSIS, ROUTINE W REFLEX MICROSCOPIC - Abnormal; Notable for the following components:   Color, Urine COLORLESS (*)    Specific Gravity, Urine 1.045 (*)    Protein, ur TRACE (*)    All other components within normal limits  CBC WITH DIFFERENTIAL/PLATELET    EKG None  Radiology CT ABDOMEN PELVIS W CONTRAST  Result Date: 07/12/2022 CLINICAL  DATA:  Left lower quadrant abdominal pain for 1 week EXAM: CT ABDOMEN AND PELVIS WITH CONTRAST TECHNIQUE: Multidetector CT imaging of the abdomen and pelvis was performed using the standard protocol following bolus administration of intravenous contrast. RADIATION DOSE REDUCTION: This exam was performed according to the departmental dose-optimization program which includes automated exposure control, adjustment of the mA and/or kV according to patient size and/or use of iterative reconstruction technique. CONTRAST:  OMNIPAQUE IOHEXOL 300 MG/ML  SOLN  COMPARISON:  CT examination dated December 12, 2021 FINDINGS: Lower chest: No acute abnormality. Hepatobiliary: No focal liver abnormality is seen. No gallstones, gallbladder wall thickening, or biliary dilatation. Pancreas: Unremarkable. No pancreatic ductal dilatation or surrounding inflammatory changes. Spleen: Normal in size without focal abnormality. Adrenals/Urinary Tract: Adrenal glands are unremarkable. 2 mm nonobstructing calculus in the lower pole of the left kidney. No evidence of hydronephrosis or ureteral calculus. No focal lesion identified. Bladder is unremarkable. Stomach/Bowel: Stomach is within normal limits. Appendix appears normal. Scattered colonic diverticulosis without evidence of acute diverticulitis. No evidence of bowel wall thickening, distention, or inflammatory changes. Vascular/Lymphatic: No significant vascular findings are present. No enlarged abdominal or pelvic lymph nodes. Reproductive: Prostate is unremarkable. Other: No abdominal wall hernia or abnormality. No abdominopelvic ascites. Musculoskeletal: No acute or significant osseous findings. IMPRESSION: 1. 2 mm nonobstructing calculus in the lower pole of the left kidney. No evidence of hydronephrosis or ureteral calculus. 2. Scattered colonic diverticulosis without evidence of acute diverticulitis. Electronically Signed   By: Larose Hires D.O.   On: 07/12/2022 11:16     Procedures Procedures    Medications Ordered in ED Medications  sodium chloride 0.9 % bolus 1,000 mL (0 mLs Intravenous Stopped 07/12/22 1106)  ondansetron (ZOFRAN) injection 4 mg (4 mg Intravenous Given 07/12/22 0938)  morphine (PF) 4 MG/ML injection 4 mg (4 mg Intravenous Given 07/12/22 0938)  iohexol (OMNIPAQUE) 300 MG/ML solution 100 mL (100 mLs Intravenous Contrast Given 07/12/22 1024)  ondansetron (ZOFRAN) injection 4 mg (4 mg Intravenous Given 07/12/22 1231)    ED Course/ Medical Decision Making/ A&P Clinical Course as of 07/12/22 1857  Fri Jul 12, 2022  1034 Lipase(!): 56 [SB]  1034 Pt re-evaluated and noted improvement of symptoms with treatment regimen in the ED.  [SB]  1125 Discussed with patient lab and imaging findings. Discussed with patient discharge treatment plan. Answered all available questions. Pt appears safe for discharge at this time.  [SB]    Clinical Course User Index [SB] Marthella Osorno A, PA-C                             Medical Decision Making Amount and/or Complexity of Data Reviewed Labs: ordered. Decision-making details documented in ED Course. Radiology: ordered.  Risk Prescription drug management.   Patient presents to the emergency department with LLQ abdominal pain x 1 week.  On exam patient with Tenderness to palpation noted to suprapubic and left lower quadrant region. Remainder of exam without acute findings.  Pt afebrile. Differential diagnosis includes pancreatitis, cholecystitis, appendicitis, acute cystitis, diverticulitis.   Additional history obtained:  Additional history obtained from Spouse/Significant Other  Labs:  I ordered, and personally interpreted labs.  The pertinent results include:  Lipase slightly elevated at 56 however improved from previous value CBC without leukocytosis and unremarkable CMP with slightly elevated glucose at 208 otherwise unremarkable Urinalysis negative  Imaging: I ordered imaging studies  including CT abdomen pelvis with I independently visualized and interpreted imaging which showed  1. 2 mm nonobstructing calculus in the lower pole of the left  kidney. No evidence of hydronephrosis or ureteral calculus.  2. Scattered colonic diverticulosis without evidence of acute  diverticulitis.   I agree with the radiologist interpretation  Medications:  I ordered medication including IVF, Zofran, and Morphine for pain management.  Reevaluation of the patient after these medicines and interventions, I reevaluated the patient and found that they have improved I have reviewed  the patients home medicines and have made adjustments as needed  Disposition: Presenting suspicious for diverticulosis as well as nephrolithiasis.  Doubt concerns this time for pyelonephritis, diverticulitis, pancreatitis, acute cystitis, appendicitis, cholecystitis. After consideration of the diagnostic results and the patients response to treatment, I feel that the patient would benefit from Discharge home.  Patient provided with prescription for Zofran and Flomax.  PDMP reviewed, short course of Percocet sent to patient's pharmacy for breakthrough pain.  Instructed patient to follow-up with his GI specialist regarding today's ED visit.  Work note provided.  Supportive care measures and strict return precautions discussed with patient at bedside. Pt acknowledges and verbalizes understanding. Pt appears safe for discharge. Follow up as indicated in discharge paperwork.   This chart was dictated using voice recognition software, Dragon. Despite the best efforts of this provider to proofread and correct errors, errors may still occur which can change documentation meaning.   Final Clinical Impression(s) / ED Diagnoses Final diagnoses:  LLQ pain  Nephrolithiasis  Diverticulosis    Rx / DC Orders ED Discharge Orders          Ordered    tamsulosin (FLOMAX) 0.4 MG CAPS capsule  Daily        07/12/22 1149     ondansetron (ZOFRAN) 4 MG tablet  Every 8 hours PRN        07/12/22 1149    oxyCODONE-acetaminophen (PERCOCET/ROXICET) 5-325 MG tablet  Every 6 hours PRN        07/12/22 1149              Lucee Brissett A, PA-C 07/12/22 1858    Gwyneth Sprout, MD 07/13/22 2043

## 2022-07-12 NOTE — Discharge Instructions (Signed)
It was a pleasure taking care of you today!  Your lab and imaging studies did not show any concerning emergent findings at this time.  Your CT scan showed concern for a stone to your left kidney and diverticulosis. No concern at this time for diverticulitis. You will be sent a prescription for flomax, a short course of percocet to take for breakthrough pain and zofran. You will be sent a prescription for Zofran, take as directed if you are experiencing nausea/vomiting.  If you have to take Zofran, wait at least 30 minutes before you attempt small sips/small bites of food/fluid.  Ensure to maintain fluid intake with water, tea, broth, soup, Pedialyte, Gatorade.  Follow-up with your primary care provider as needed regarding this ED visit.  Call your GI specialist to set up a follow up appointment regarding todays ED visit. Return to the emergency department if you experience increasing/worsening symptoms.

## 2022-07-12 NOTE — ED Notes (Signed)
Pt unable to give urine sample at this time. Specimen cup at bs.

## 2022-07-12 NOTE — ED Notes (Signed)
ED Provider at bedside. 

## 2022-07-13 DIAGNOSIS — Z419 Encounter for procedure for purposes other than remedying health state, unspecified: Secondary | ICD-10-CM | POA: Diagnosis not present

## 2022-07-24 ENCOUNTER — Ambulatory Visit: Payer: Medicaid Other | Attending: Primary Care | Admitting: Pharmacist

## 2022-07-24 ENCOUNTER — Emergency Department (HOSPITAL_COMMUNITY)
Admission: EM | Admit: 2022-07-24 | Discharge: 2022-07-24 | Disposition: A | Discharge status: Home / Self Care | Payer: Medicaid Other | Attending: Emergency Medicine | Admitting: Emergency Medicine

## 2022-07-24 ENCOUNTER — Encounter (HOSPITAL_COMMUNITY): Payer: Self-pay | Admitting: Emergency Medicine

## 2022-07-24 DIAGNOSIS — Z79899 Other long term (current) drug therapy: Secondary | ICD-10-CM

## 2022-07-24 DIAGNOSIS — Z1152 Encounter for screening for COVID-19: Secondary | ICD-10-CM | POA: Insufficient documentation

## 2022-07-24 DIAGNOSIS — I10 Essential (primary) hypertension: Secondary | ICD-10-CM | POA: Insufficient documentation

## 2022-07-24 DIAGNOSIS — R197 Diarrhea, unspecified: Secondary | ICD-10-CM | POA: Diagnosis not present

## 2022-07-24 DIAGNOSIS — Z794 Long term (current) use of insulin: Secondary | ICD-10-CM | POA: Insufficient documentation

## 2022-07-24 DIAGNOSIS — E119 Type 2 diabetes mellitus without complications: Secondary | ICD-10-CM | POA: Diagnosis not present

## 2022-07-24 LAB — CBC WITH DIFFERENTIAL/PLATELET
Abs Immature Granulocytes: 0.01 10*3/uL (ref 0.00–0.07)
Basophils Absolute: 0.1 10*3/uL (ref 0.0–0.1)
Basophils Relative: 1 %
Eosinophils Absolute: 0.2 10*3/uL (ref 0.0–0.5)
Eosinophils Relative: 3 %
HCT: 41.2 % (ref 39.0–52.0)
Hemoglobin: 13.2 g/dL (ref 13.0–17.0)
Immature Granulocytes: 0 %
Lymphocytes Relative: 29 %
Lymphs Abs: 2.1 10*3/uL (ref 0.7–4.0)
MCH: 28.4 pg (ref 26.0–34.0)
MCHC: 32 g/dL (ref 30.0–36.0)
MCV: 88.8 fL (ref 80.0–100.0)
Monocytes Absolute: 0.5 10*3/uL (ref 0.1–1.0)
Monocytes Relative: 7 %
Neutro Abs: 4.5 10*3/uL (ref 1.7–7.7)
Neutrophils Relative %: 60 %
Platelets: 253 10*3/uL (ref 150–400)
RBC: 4.64 MIL/uL (ref 4.22–5.81)
RDW: 13.6 % (ref 11.5–15.5)
WBC: 7.3 10*3/uL (ref 4.0–10.5)
nRBC: 0 % (ref 0.0–0.2)

## 2022-07-24 LAB — I-STAT CHEM 8, ED
BUN: 15 mg/dL (ref 6–20)
Calcium, Ion: 1.18 mmol/L (ref 1.15–1.40)
Chloride: 100 mmol/L (ref 98–111)
Creatinine, Ser: 0.9 mg/dL (ref 0.61–1.24)
Glucose, Bld: 234 mg/dL — ABNORMAL HIGH (ref 70–99)
HCT: 43 % (ref 39.0–52.0)
Hemoglobin: 14.6 g/dL (ref 13.0–17.0)
Potassium: 3.6 mmol/L (ref 3.5–5.1)
Sodium: 136 mmol/L (ref 135–145)
TCO2: 29 mmol/L (ref 22–32)

## 2022-07-24 LAB — RESP PANEL BY RT-PCR (RSV, FLU A&B, COVID)  RVPGX2
Influenza A by PCR: NEGATIVE
Influenza B by PCR: NEGATIVE
Resp Syncytial Virus by PCR: NEGATIVE
SARS Coronavirus 2 by RT PCR: NEGATIVE

## 2022-07-24 MED ORDER — ACETAMINOPHEN 500 MG PO TABS
1000.0000 mg | ORAL_TABLET | Freq: Once | ORAL | Status: AC
  Administered 2022-07-24: 1000 mg via ORAL
  Filled 2022-07-24: qty 2

## 2022-07-24 MED ORDER — HYDRALAZINE HCL 25 MG PO TABS
25.0000 mg | ORAL_TABLET | Freq: Once | ORAL | Status: AC
  Administered 2022-07-24: 25 mg via ORAL
  Filled 2022-07-24: qty 1

## 2022-07-24 MED ORDER — DICYCLOMINE HCL 10 MG PO CAPS
10.0000 mg | ORAL_CAPSULE | Freq: Once | ORAL | Status: AC
  Administered 2022-07-24: 10 mg via ORAL
  Filled 2022-07-24: qty 1

## 2022-07-24 NOTE — ED Notes (Signed)
US PIV placed. 

## 2022-07-24 NOTE — ED Triage Notes (Signed)
Patient arrives with wife. States this morning when he woke up he was lightheaded and had a headache. Patient checked his blood pressure and it was 196/102. Wife gave him 10mg  of amlodipine and metformin. Also states that he's had diarrhea for 2 weeks. Rates headache pain 2/10. Denies chest pain. BP triage: 169/127.

## 2022-07-24 NOTE — Discharge Instructions (Signed)
Follow up with your family doc in the office.  Take your medications as prescribed.  Return for worsening symptoms.

## 2022-07-24 NOTE — ED Provider Notes (Addendum)
[facility] EMERGENCY DEPARTMENT AT [facility] Provider Note   CSN: [id number] Arrival date & time: 07/24/22  4034     History  Chief Complaint  Patient presents with   Hypertension   Diarrhea    Jason Bruce is a 47 y.o. male.  The history is provided by the patient and the spouse.  Hypertension This is a chronic problem. The current episode started more than 1 week ago. The problem occurs constantly. The problem has been gradually worsening (just restarted BP medication after being off for some time). Pertinent negatives include no chest pain. Nothing aggravates the symptoms. Nothing relieves the symptoms. Treatments tried: took a dose of home BP medication. The treatment provided significant relief.  Patient with HTN who was off all medications until yesterday and then restarted.  Also 2 weeks of diarrhea.  Had a slight headache this am and his wife gave him a dose of Norvasc and metformin.    Negative CT for same diarrhea and pain last week, symptoms persist.  Has a GI specialist in [facility].      Past Medical History:  Diagnosis Date   Acute hypoxemic respiratory failure due to COVID-19 (HCC) 09/30/2019   AKI (acute kidney injury) (HCC)    Diabetes mellitus without complication (HCC)    Hypertension    Nephrolithiasis    Pneumonia due to COVID-19 virus 09/30/2019   Sigmoid diverticulitis 09/04/2021     Home Medications Prior to Admission medications   Medication Sig Start Date End Date Taking? Authorizing Provider  Accu-Chek FastClix Lancets MISC Use to test blood sugar twice a day 09/20/20   Storm Frisk, MD  Acetaminophen (TYLENOL EXTRA STRENGTH PO) Take 500 mg by mouth.    [provider]  amLODipine (NORVASC) 10 MG tablet Take 1 tablet (10 mg total) by mouth daily. 01/30/22 01/30/23  Grayce Sessions, NP  atorvastatin (LIPITOR) 20 MG tablet Take 1 tablet (20 mg total) by mouth daily. 07/05/21   Storm Frisk, MD  Blood Glucose  Monitoring Suppl (ACCU-CHEK GUIDE) w/Device KIT Test blood sugar twice a day 10/27/19   Storm Frisk, MD  carvedilol (COREG) 25 MG tablet Take 1 tablet (25 mg total) by mouth 2 (two) times daily with a meal. 01/30/22   Grayce Sessions, NP  Dulaglutide (TRULICITY) 0.75 MG/0.5ML SOPN Inject 0.75 mg into the skin once a week. For 4 weeks. Then, increase to the 1.5 mg pen and inject once weekly. 05/01/22   Hoy Register, MD  Dulaglutide (TRULICITY) 1.5 MG/0.5ML SOPN Inject 1.5 mg into the skin once a week. 05/01/22   Hoy Register, MD  empagliflozin (JARDIANCE) 10 MG TABS tablet Take 1 tablet (10 mg total) by mouth daily before breakfast. 09/26/21   Storm Frisk, MD  glucose blood (ACCU-CHEK GUIDE) test strip Use as instructed 07/05/21   Storm Frisk, MD  hydrALAZINE (APRESOLINE) 100 MG tablet TAKE 1 TABLET(100 MG) BY MOUTH THREE TIMES DAILY 01/30/22   Grayce Sessions, NP  insulin glargine (LANTUS SOLOSTAR) 100 UNIT/ML Solostar Pen Inject 25 Units into the skin at bedtime. 01/30/22   Grayce Sessions, NP  Insulin Pen Needle (BD ULTRA-FINE PEN NEEDLES) 29G X 12.7MM MISC Use with Lantus Solostar pen nightly 07/05/21   Storm Frisk, MD  Lancets Misc. (ACCU-CHEK FASTCLIX LANCET) KIT Use to test blood sugar twice a day 07/05/21   Storm Frisk, MD  ondansetron (ZOFRAN) 4 MG tablet Take 1 tablet (4 mg total)  by mouth every 8 (eight) hours as needed for nausea or vomiting. 07/12/22   Blue, Soijett A, PA-C  oxyCODONE-acetaminophen (PERCOCET/ROXICET) 5-325 MG tablet Take 1 tablet by mouth every 6 (six) hours as needed for severe pain. 07/12/22   Blue, Soijett A, PA-C  Potassium Citrate 15 MEQ (1620 MG) TBCR  11/03/21   [provider]  tamsulosin (FLOMAX) 0.4 MG CAPS capsule Take 1 capsule (0.4 mg total) by mouth daily. 07/12/22   Blue, Soijett A, PA-C  valsartan (DIOVAN) 160 MG tablet Take 1 tablet (160 mg total) by mouth daily. 01/30/22   Grayce Sessions, NP       Allergies    Shellfish allergy    Review of Systems   Review of Systems  Constitutional:  Negative for fever.  Respiratory:  Negative for stridor.   Cardiovascular:  Negative for chest pain.  Gastrointestinal:  Positive for diarrhea.  Neurological:  Negative for facial asymmetry, speech difficulty, weakness and numbness.  All other systems reviewed and are negative.   Physical Exam Updated Vital Signs BP (!) 169/127 (BP Location: Right Arm)   Pulse 80   Temp 98.6 F (37 C) (Oral)   Resp 20   SpO2 98%  Physical Exam Vitals and nursing note reviewed.  Constitutional:      General: He is not in acute distress.    Appearance: Normal appearance. He is well-developed. He is not diaphoretic.  HENT:     Head: Normocephalic and atraumatic.     Nose: Nose normal.  Eyes:     Conjunctiva/sclera: Conjunctivae normal.     Pupils: Pupils are equal, round, and reactive to light.  Cardiovascular:     Rate and Rhythm: Normal rate and regular rhythm.     Pulses: Normal pulses.     Heart sounds: Normal heart sounds.  Pulmonary:     Effort: Pulmonary effort is normal.     Breath sounds: Normal breath sounds. No wheezing or rales.  Abdominal:     General: Bowel sounds are normal.     Palpations: Abdomen is soft.     Tenderness: There is no abdominal tenderness. There is no guarding or rebound.  Musculoskeletal:        General: Normal range of motion.     Cervical back: Normal range of motion and neck supple.  Skin:    General: Skin is warm and dry.     Capillary Refill: Capillary refill takes less than 2 seconds.  Neurological:     General: No focal deficit present.     Mental Status: He is alert and oriented to person, place, and time.     Deep Tendon Reflexes: Reflexes normal.  Psychiatric:        Mood and Affect: Mood normal.        Behavior: Behavior normal.     ED Results / Procedures / Treatments   Labs (all labs ordered are listed, but only abnormal results are  displayed) Results for orders placed or performed during the hospital encounter of 07/24/22  Resp panel by RT-PCR (RSV, Flu A&B, Covid) Anterior Nasal Swab   Specimen: Anterior Nasal Swab  Result Value Ref Range   SARS Coronavirus 2 by RT PCR NEGATIVE NEGATIVE   Influenza A by PCR NEGATIVE NEGATIVE   Influenza B by PCR NEGATIVE NEGATIVE   Resp Syncytial Virus by PCR NEGATIVE NEGATIVE  CBC with Differential  Result Value Ref Range   WBC 7.3 4.0 - 10.5 K/uL   RBC 4.64 4.22 -  5.81 MIL/uL   Hemoglobin 13.2 13.0 - 17.0 g/dL   HCT 16.1 09.6 - 04.5 %   MCV 88.8 80.0 - 100.0 fL   MCH 28.4 26.0 - 34.0 pg   MCHC 32.0 30.0 - 36.0 g/dL   RDW 40.9 81.1 - 91.4 %   Platelets 253 150 - 400 K/uL   nRBC 0.0 0.0 - 0.2 %   Neutrophils Relative % 60 %   Neutro Abs 4.5 1.7 - 7.7 K/uL   Lymphocytes Relative 29 %   Lymphs Abs 2.1 0.7 - 4.0 K/uL   Monocytes Relative 7 %   Monocytes Absolute 0.5 0.1 - 1.0 K/uL   Eosinophils Relative 3 %   Eosinophils Absolute 0.2 0.0 - 0.5 K/uL   Basophils Relative 1 %   Basophils Absolute 0.1 0.0 - 0.1 K/uL   Immature Granulocytes 0 %   Abs Immature Granulocytes 0.01 0.00 - 0.07 K/uL   CT ABDOMEN PELVIS W CONTRAST  Result Date: 07/12/2022 CLINICAL DATA:  Left lower quadrant abdominal pain for 1 week EXAM: CT ABDOMEN AND PELVIS WITH CONTRAST TECHNIQUE: Multidetector CT imaging of the abdomen and pelvis was performed using the standard protocol following bolus administration of intravenous contrast. RADIATION DOSE REDUCTION: This exam was performed according to the departmental dose-optimization program which includes automated exposure control, adjustment of the mA and/or kV according to patient size and/or use of iterative reconstruction technique. CONTRAST:  OMNIPAQUE IOHEXOL 300 MG/ML  SOLN COMPARISON:  CT examination dated December 12, 2021 FINDINGS: Lower chest: No acute abnormality. Hepatobiliary: No focal liver abnormality is seen. No gallstones, gallbladder  wall thickening, or biliary dilatation. Pancreas: Unremarkable. No pancreatic ductal dilatation or surrounding inflammatory changes. Spleen: Normal in size without focal abnormality. Adrenals/Urinary Tract: Adrenal glands are unremarkable. 2 mm nonobstructing calculus in the lower pole of the left kidney. No evidence of hydronephrosis or ureteral calculus. No focal lesion identified. Bladder is unremarkable. Stomach/Bowel: Stomach is within normal limits. Appendix appears normal. Scattered colonic diverticulosis without evidence of acute diverticulitis. No evidence of bowel wall thickening, distention, or inflammatory changes. Vascular/Lymphatic: No significant vascular findings are present. No enlarged abdominal or pelvic lymph nodes. Reproductive: Prostate is unremarkable. Other: No abdominal wall hernia or abnormality. No abdominopelvic ascites. Musculoskeletal: No acute or significant osseous findings. IMPRESSION: 1. 2 mm nonobstructing calculus in the lower pole of the left kidney. No evidence of hydronephrosis or ureteral calculus. 2. Scattered colonic diverticulosis without evidence of acute diverticulitis. Electronically Signed   By: Larose Hires D.O.   On: 07/12/2022 11:16     Radiology No results found.  Procedures Procedures    EKG Interpretation  Date/Time:  Wednesday July 24 2022 07:03:43 EDT Ventricular Rate:  69 PR Interval:  180 QRS Duration: 113 QT Interval:  371 QTC Calculation: 398 R Axis:   11 Text Interpretation: Sinus rhythm Probable left atrial enlargement Incomplete right bundle branch block Confirmed by Yomira Flitton ([id number]) on 07/24/2022 7:06:27 AM        Medications Ordered in ED Medications  dicyclomine (BENTYL) capsule 10 mg (has no administration in time range)  acetaminophen (TYLENOL) tablet 1,000 mg (1,000 mg Oral Given 07/24/22 0554)    ED Course/ Medical Decision Making/ A&P                             Medical Decision Making Patient with HTN who  was off BP medication until yesterday   Amount and/or Complexity of Data  Reviewed Independent Historian: spouse    Details: See above  External Data Reviewed: radiology and notes.    Details: Previous note and CT reviewed  Labs: ordered.    Details: Negative covid and flu.  White count normal 7.3, normal hemoglobin 13.2, normal platelet count  ECG/medicine tests: ordered and independent interpretation performed. Decision-making details documented in ED Course.  Risk OTC drugs. Prescription drug management. Risk Details: Attempted to get stool panel.  Patient will need to speak with his GI doctor as he had to miss his colonoscopy.      Final Clinical Impression(s) / ED Diagnoses Final diagnoses:  None    Signed out to Dr. Adela Lank pending improvement in pressure       Alda Gaultney, MD 07/24/22 [phone]

## 2022-07-24 NOTE — Progress Notes (Signed)
07/24/2022 Name: Jason Bruce MRN: [id number] DOB: 1975-02-26  No chief complaint on file.   Jason Bruce is a 47 y.o. year old male who presented for a telephone visit.   They were referred to the pharmacist by their Case Management Team  for assistance in managing hypertension.   Patient is participating in a Managed Medicaid Plan:  Yes  Subjective:  Care Team: Primary Care Provider: Grayce Sessions, NP ; Next Scheduled Visit: 08/01/2022  Medication Access/Adherence  Current Pharmacy:  [id number] - [place], [place] - [address] AT [facility] [address] [place] [address] Phone: [phone] Fax: [phone]  Patient reports affordability concerns with their medications: No  Patient reports access/transportation concerns to their pharmacy: No  Patient reports adherence concerns with their medications:  Yes  - pt admits to struggling with medication adherence with antihypertensives. Has not filled his antihypertensives since 03/29/2022 (were filled at that time for 30 days). Not surprisingly, his BP has been elevated in clinic recently at recent ED visits (5/31, 6/12).    Hypertension:  Current medications: amlodipine 10 mg daily, carvedilol 25 mg BID, hydralazine 100 mg TID, valsartan 160 mg daily.  Medications previously tried: HCTZ (held d/t AKI in 2021), lisinopril (held d/t AKI in 2021), losartan (switched to valsartan for better efficacy), metoprolol tartrate (changed to carvedilol for better BP lowering)  Patient does not have a validated, automated, upper arm home BP cuff Current blood pressure readings readings: none  Patient denies hypotensive s/sx including dizziness, lightheadedness.  Patient denies hypertensive symptoms including headache, chest pain, shortness of breath  Current meal patterns: not covered   Current physical activity: not covered   Objective:  Lab Results  Component Value Date   HGBA1C 6.4  12/17/2021    Lab Results  Component Value Date   CREATININE 0.90 07/24/2022   BUN 15 07/24/2022   NA 136 07/24/2022   K 3.6 07/24/2022   CL 100 07/24/2022   CO2 27 07/12/2022    Lab Results  Component Value Date   CHOL 208 (H) 02/01/2020   HDL 48 02/01/2020   LDLCALC 135 (H) 02/01/2020   TRIG 139 02/01/2020   CHOLHDL 4.3 02/01/2020    Medications Reviewed Today     Reviewed by Cy Blamer, MD (Physician) on 07/24/22 at 0533  Med List Status: <None>   Medication Order Taking? Sig Documenting Provider Last Dose Status Informant  Accu-Chek FastClix Lancets MISC [id number]  Use to test blood sugar twice a day Storm Frisk, MD  Active   Acetaminophen (TYLENOL EXTRA STRENGTH PO) [id number]  Take 500 mg by mouth. [provider]  Active            Med Note Sherilyn Cooter, [facility]   Wed Jan 02, 2022  8:27 AM) Prn last dose over one month   amLODipine (NORVASC) 10 MG tablet [id number]  Take 1 tablet (10 mg total) by mouth daily. Grayce Sessions, NP  Active   atorvastatin (LIPITOR) 20 MG tablet [id number]  Take 1 tablet (20 mg total) by mouth daily. Storm Frisk, MD  Active   Blood Glucose Monitoring Suppl (ACCU-CHEK GUIDE) w/Device Andria Rhein [id number]  Test blood sugar twice a day Storm Frisk, MD  Active   carvedilol (COREG) 25 MG tablet [id number]  Take 1 tablet (25 mg total) by mouth 2 (two) times daily with a meal. Grayce Sessions, NP  Active   Dulaglutide (TRULICITY) 0.75 MG/0.5ML  SOPN [id number]  Inject 0.75 mg into the skin once a week. For 4 weeks. Then, increase to the 1.5 mg pen and inject once weekly. Hoy Register, MD  Active   Dulaglutide (TRULICITY) 1.5 MG/0.5ML SOPN [id number]  Inject 1.5 mg into the skin once a week. Hoy Register, MD  Active   empagliflozin (JARDIANCE) 10 MG TABS tablet [id number]  Take 1 tablet (10 mg total) by mouth daily before breakfast. Storm Frisk, MD  Active   glucose blood (ACCU-CHEK GUIDE) test strip [id number]  Use as  instructed Storm Frisk, MD  Active   hydrALAZINE (APRESOLINE) 100 MG tablet [id number]  TAKE 1 TABLET(100 MG) BY MOUTH THREE TIMES DAILY Grayce Sessions, NP  Active   insulin glargine (LANTUS SOLOSTAR) 100 UNIT/ML Solostar Pen [id number]  Inject 25 Units into the skin at bedtime. Grayce Sessions, NP  Active   Insulin Pen Needle (BD ULTRA-FINE PEN NEEDLES) 29G X 12.7MM MISC [id number]  Use with Lantus Solostar pen nightly Storm Frisk, MD  Active   Lancets Misc. (ACCU-CHEK FASTCLIX LANCET) KIT [id number]  Use to test blood sugar twice a day Storm Frisk, MD  Active   ondansetron [facility]) 4 MG tablet [id number]  Take 1 tablet (4 mg total) by mouth every 8 (eight) hours as needed for nausea or vomiting. Blue, Soijett A, PA-C  Active   oxyCODONE-acetaminophen (PERCOCET/ROXICET) 5-325 MG tablet [id number]  Take 1 tablet by mouth every 6 (six) hours as needed for severe pain. Blue, Soijett A, PA-C  Active   Potassium Citrate 15 MEQ (1620 MG) TBCR [id number]   [provider]  Active   tamsulosin (FLOMAX) 0.4 MG CAPS capsule [id number]  Take 1 capsule (0.4 mg total) by mouth daily. Blue, Soijett A, PA-C  Active   valsartan (DIOVAN) 160 MG tablet [id number]  Take 1 tablet (160 mg total) by mouth daily. Grayce Sessions, NP  Active               Assessment/Plan:   Hypertension: - Currently uncontrolled - Reviewed long term cardiovascular and renal outcomes of uncontrolled blood pressure - Reviewed appropriate blood pressure monitoring technique and reviewed goal blood pressure. Recommended to check home blood pressure  - Recommend to resume taking antihypertensive medications. HCTZ was held in the past d/t AKI. He is prone to dehydration and electrolyte imbalances with diarrhea secondary to diverticulitis. However, his SCR and electrolyte status has been wnl and stable since 2021. I recommend to change valsartan to valsartan-HCTZ and monitor closely at upcoming PCP visit if  BP is still above goal.   Medication Management: - Currently strategy insufficient to maintain appropriate adherence to prescribed medication regimen - Suggested use of weekly pill box to organize medications. - I called his pharmacy personally and ordered refills of all antihypertensives for 90-day supplies. [facility] has signed him up for text notification and he will receive a text when his blood pressure medications are ready for pick-up.  Follow Up Plan: PCP in 1 week. Me in 1 month.   Butch Penny, PharmD, P[place]y [place], CPP Clinical Pharmacist [facility] [phone]

## 2022-07-24 NOTE — ED Provider Notes (Signed)
Received patient in turnover from Dr. Nicanor Alcon.  Please see their note for further details of Hx, PE.  Briefly patient is a 47 y.o. male with a Hypertension and Diarrhea .  Patient has not been on his blood pressure medications.  Plan to give a dose of his oral medications here and then reassess.  Patient has had no episodes of diarrhea here.  Lab work largely unremarkable.  COVID flu and RSV negative.  Will obtain stool sample if able likely discharge home.  BP improved on repeat check.  D/c home.     Melene Plan, DO 07/24/22 [phone]

## 2022-07-24 NOTE — ED Notes (Signed)
BP noted after multiple cuff changes and repositioning.

## 2022-07-29 ENCOUNTER — Ambulatory Visit (INDEPENDENT_AMBULATORY_CARE_PROVIDER_SITE_OTHER): Payer: Self-pay | Admitting: Primary Care

## 2022-07-31 ENCOUNTER — Telehealth: Payer: Self-pay

## 2022-07-31 NOTE — Telephone Encounter (Signed)
Patient attempted to be outreached by Alesia Banda, PharmD Candidate on 07/31/22 to discuss hypertension. Patient reports that he is currently at work and will be able to chat after 4 PM.   Alesia Banda, Student-PharmD

## 2022-08-01 ENCOUNTER — Encounter (INDEPENDENT_AMBULATORY_CARE_PROVIDER_SITE_OTHER): Payer: Self-pay | Admitting: Primary Care

## 2022-08-01 ENCOUNTER — Ambulatory Visit (INDEPENDENT_AMBULATORY_CARE_PROVIDER_SITE_OTHER): Payer: Medicaid Other | Admitting: Primary Care

## 2022-08-01 VITALS — BP 184/127 | HR 98 | Resp 16 | Wt 333.6 lb

## 2022-08-01 DIAGNOSIS — Z7985 Long-term (current) use of injectable non-insulin antidiabetic drugs: Secondary | ICD-10-CM | POA: Diagnosis not present

## 2022-08-01 DIAGNOSIS — Z7984 Long term (current) use of oral hypoglycemic drugs: Secondary | ICD-10-CM

## 2022-08-01 DIAGNOSIS — Z794 Long term (current) use of insulin: Secondary | ICD-10-CM

## 2022-08-01 DIAGNOSIS — R809 Proteinuria, unspecified: Secondary | ICD-10-CM

## 2022-08-01 DIAGNOSIS — E1129 Type 2 diabetes mellitus with other diabetic kidney complication: Secondary | ICD-10-CM | POA: Diagnosis not present

## 2022-08-01 DIAGNOSIS — I152 Hypertension secondary to endocrine disorders: Secondary | ICD-10-CM | POA: Diagnosis not present

## 2022-08-01 LAB — POCT GLYCOSYLATED HEMOGLOBIN (HGB A1C): HbA1c, POC (controlled diabetic range): 9.5 % — AB (ref 0.0–7.0)

## 2022-08-05 NOTE — Progress Notes (Signed)
[facility]  Jason Bruce, is a 47 y.o. male  [id number]  [id number]  DOB - 12-24-1975  Chief Complaint  Patient presents with   Diabetes   Hypertension       Subjective:   Jason Bruce is a 47 y.o. male here today for a follow up hypertension.  Patient has No headache, No chest pain, No abdominal pain - No Nausea, No new weakness tingling or numbness, No Cough - shortness of breath.  Diabetes Denies polyuria, polydipsia, polyphasia or vision changes.  Does not check blood sugars at home.   No problems updated.  Allergies  Allergen Reactions   Shellfish Allergy Anaphylaxis and Swelling    Past Medical History:  Diagnosis Date   Acute hypoxemic respiratory failure due to COVID-19 (HCC) 09/30/2019   AKI (acute kidney injury) (HCC)    Diabetes mellitus without complication (HCC)    Hypertension    Nephrolithiasis    Pneumonia due to COVID-19 virus 09/30/2019   Sigmoid diverticulitis 09/04/2021    Current Outpatient Medications on File Prior to Visit  Medication Sig Dispense Refill   Accu-Chek FastClix Lancets MISC Use to test blood sugar twice a day 100 each 1   Acetaminophen (TYLENOL EXTRA STRENGTH PO) Take 500 mg by mouth.     amLODipine (NORVASC) 10 MG tablet Take 1 tablet (10 mg total) by mouth daily. 90 tablet 1   atorvastatin (LIPITOR) 20 MG tablet Take 1 tablet (20 mg total) by mouth daily. 90 tablet 1   Blood Glucose Monitoring Suppl (ACCU-CHEK GUIDE) w/Device KIT Test blood sugar twice a day 1 kit 0   carvedilol (COREG) 25 MG tablet Take 1 tablet (25 mg total) by mouth 2 (two) times daily with a meal. 120 tablet 1   Dulaglutide (TRULICITY) 0.75 MG/0.5ML SOPN Inject 0.75 mg into the skin once a week. For 4 weeks. Then, increase to the 1.5 mg pen and inject once weekly. 2 mL 0   Dulaglutide (TRULICITY) 1.5 MG/0.5ML SOPN Inject 1.5 mg into the skin once a week. 6 mL 1   empagliflozin (JARDIANCE) 10 MG TABS tablet Take 1 tablet (10 mg  total) by mouth daily before breakfast. 30 tablet 3   glucose blood (ACCU-CHEK GUIDE) test strip Use as instructed 100 each 12   hydrALAZINE (APRESOLINE) 100 MG tablet TAKE 1 TABLET(100 MG) BY MOUTH THREE TIMES DAILY 270 tablet 1   insulin glargine (LANTUS SOLOSTAR) 100 UNIT/ML Solostar Pen Inject 25 Units into the skin at bedtime. 15 mL 4   Insulin Pen Needle (BD ULTRA-FINE PEN NEEDLES) 29G X 12.7MM MISC Use with Lantus Solostar pen nightly 100 each 2   Lancets Misc. (ACCU-CHEK FASTCLIX LANCET) KIT Use to test blood sugar twice a day 1 kit 0   ondansetron (ZOFRAN) 4 MG tablet Take 1 tablet (4 mg total) by mouth every 8 (eight) hours as needed for nausea or vomiting. 12 tablet 0   oxyCODONE-acetaminophen (PERCOCET/ROXICET) 5-325 MG tablet Take 1 tablet by mouth every 6 (six) hours as needed for severe pain. 9 tablet 0   Potassium Citrate 15 MEQ (1620 MG) TBCR      tamsulosin (FLOMAX) 0.4 MG CAPS capsule Take 1 capsule (0.4 mg total) by mouth daily. 30 capsule 0   valsartan (DIOVAN) 160 MG tablet Take 1 tablet (160 mg total) by mouth daily. 90 tablet 3   No current facility-administered medications on file prior to visit.    Objective:   Vitals:   08/01/22 1102 08/01/22 1103  BP: (Abnormal) 177/116 (Abnormal) 184/127  Pulse: 98   Resp: 16   SpO2: 100%   Weight: (Abnormal) 333 lb 9.6 oz (151.3 kg)     Comprehensive ROS Pertinent positive and negative noted in HPI   Exam General appearance : Awake, alert, not in any distress. Speech Clear. Not toxic looking HEENT: Atraumatic and Normocephalic, pupils equally reactive to light and accomodation Neck: Supple, no JVD. No cervical lymphadenopathy.  Chest: Good air entry bilaterally, no added sounds  CVS: S1 S2 regular, no murmurs.  Abdomen: Bowel sounds present, Non tender and not distended with no gaurding, rigidity or rebound. Extremities: B/L Lower Ext shows no edema, both legs are warm to touch Neurology: Awake alert, and oriented X  3, CN II-XII intact, Non focal Skin: No Rash  Data Review Lab Results  Component Value Date   HGBA1C 9.5 (A) 08/01/2022   HGBA1C 6.4 12/17/2021   HGBA1C 6.8 09/26/2021    Assessment & Plan  Jason Bruce was seen today for diabetes and hypertension.  Diagnoses and all orders for this visit:  Controlled type 2 diabetes mellitus with microalbuminuria, without long-term current use of insulin (HCC) -     POCT glycosylated hemoglobin (Hb A1C) A1c has increased from 6.4-9.5  Complications from uncontrolled diabetes -diabetic retinopathy leading to blindness, diabetic nephropathy leading to dialysis, decrease in circulation decrease in sores or wound healing which may lead to amputations and increase of heart attack and stroke  Also managed by clinical pharmacist which she seen on 07/24/2022  Hypertension due to endocrine disorder Difficult to manage with underlining chronic kidney disease.  Followed by clinical pharmacist medications adjustments made DIET: Limit salt intake, read nutrition labels to check salt content, limit fried and high fatty foods  Avoid using multisymptom OTC cold preparations that generally contain sudafed which can rise BP. Consult with pharmacist on best cold relief products to use for persons with HTN EXERCISE Discussed incorporating exercise such as walking - 30 minutes most days of the week and can do in 10 minute intervals    Patient have been counseled extensively about nutrition and exercise. Other issues discussed during this visit include: low cholesterol diet, weight control and daily exercise, foot care, annual eye examinations at Ophthalmology, importance of adherence with medications and regular follow-up. We also discussed long term complications of uncontrolled diabetes and hypertension.    The patient was given clear instructions to go to ER or return to medical center if symptoms don't improve, worsen or new problems develop. The patient verbalized  understanding. The patient was told to call to get lab results if they haven't heard anything in the next week.   This note has been created with Education officer, environmental. Any transcriptional errors are unintentional.   Jason Sessions, NP 08/05/2022, 8:58 AM

## 2022-08-12 DIAGNOSIS — Z419 Encounter for procedure for purposes other than remedying health state, unspecified: Secondary | ICD-10-CM | POA: Diagnosis not present

## 2022-08-13 ENCOUNTER — Ambulatory Visit (INDEPENDENT_AMBULATORY_CARE_PROVIDER_SITE_OTHER): Payer: Medicaid Other

## 2022-08-13 VITALS — BP 139/83

## 2022-08-13 DIAGNOSIS — Z0130 Encounter for examination of blood pressure without abnormal findings: Secondary | ICD-10-CM

## 2022-09-12 DIAGNOSIS — Z419 Encounter for procedure for purposes other than remedying health state, unspecified: Secondary | ICD-10-CM | POA: Diagnosis not present

## 2022-10-13 DIAGNOSIS — Z419 Encounter for procedure for purposes other than remedying health state, unspecified: Secondary | ICD-10-CM | POA: Diagnosis not present

## 2022-11-04 ENCOUNTER — Ambulatory Visit (INDEPENDENT_AMBULATORY_CARE_PROVIDER_SITE_OTHER): Payer: Medicaid Other | Admitting: Primary Care

## 2022-11-04 ENCOUNTER — Telehealth (INDEPENDENT_AMBULATORY_CARE_PROVIDER_SITE_OTHER): Payer: Self-pay | Admitting: Primary Care

## 2022-11-04 NOTE — Telephone Encounter (Signed)
Rescheduled apt for Friday

## 2022-11-08 ENCOUNTER — Telehealth (INDEPENDENT_AMBULATORY_CARE_PROVIDER_SITE_OTHER): Payer: Medicaid Other | Admitting: Primary Care

## 2022-11-12 DIAGNOSIS — Z419 Encounter for procedure for purposes other than remedying health state, unspecified: Secondary | ICD-10-CM | POA: Diagnosis not present

## 2022-11-29 ENCOUNTER — Ambulatory Visit (INDEPENDENT_AMBULATORY_CARE_PROVIDER_SITE_OTHER): Payer: Medicaid Other | Admitting: Primary Care

## 2022-11-29 ENCOUNTER — Other Ambulatory Visit (INDEPENDENT_AMBULATORY_CARE_PROVIDER_SITE_OTHER): Payer: Self-pay | Admitting: Primary Care

## 2022-11-29 ENCOUNTER — Encounter (INDEPENDENT_AMBULATORY_CARE_PROVIDER_SITE_OTHER): Payer: Self-pay | Admitting: Primary Care

## 2022-11-29 VITALS — BP 162/92 | HR 96 | Resp 16 | Wt 338.2 lb

## 2022-11-29 DIAGNOSIS — E876 Hypokalemia: Secondary | ICD-10-CM

## 2022-11-29 DIAGNOSIS — E6609 Other obesity due to excess calories: Secondary | ICD-10-CM

## 2022-11-29 DIAGNOSIS — R809 Proteinuria, unspecified: Secondary | ICD-10-CM | POA: Diagnosis not present

## 2022-11-29 DIAGNOSIS — Z6841 Body Mass Index (BMI) 40.0 and over, adult: Secondary | ICD-10-CM

## 2022-11-29 DIAGNOSIS — Z794 Long term (current) use of insulin: Secondary | ICD-10-CM | POA: Diagnosis not present

## 2022-11-29 DIAGNOSIS — I152 Hypertension secondary to endocrine disorders: Secondary | ICD-10-CM

## 2022-11-29 DIAGNOSIS — Z2821 Immunization not carried out because of patient refusal: Secondary | ICD-10-CM

## 2022-11-29 DIAGNOSIS — E1129 Type 2 diabetes mellitus with other diabetic kidney complication: Secondary | ICD-10-CM

## 2022-11-29 DIAGNOSIS — E782 Mixed hyperlipidemia: Secondary | ICD-10-CM

## 2022-11-29 DIAGNOSIS — Z7985 Long-term (current) use of injectable non-insulin antidiabetic drugs: Secondary | ICD-10-CM

## 2022-11-29 DIAGNOSIS — Z7984 Long term (current) use of oral hypoglycemic drugs: Secondary | ICD-10-CM

## 2022-11-29 LAB — POCT GLYCOSYLATED HEMOGLOBIN (HGB A1C): HbA1c, POC (controlled diabetic range): 10.4 % — AB (ref 0.0–7.0)

## 2022-11-29 MED ORDER — LANTUS SOLOSTAR 100 UNIT/ML ~~LOC~~ SOPN
30.0000 [IU] | PEN_INJECTOR | Freq: Every day | SUBCUTANEOUS | 4 refills | Status: DC
Start: 2022-11-29 — End: 2022-12-24

## 2022-11-29 MED ORDER — HYDRALAZINE HCL 100 MG PO TABS
50.0000 mg | ORAL_TABLET | Freq: Three times a day (TID) | ORAL | 1 refills | Status: DC
Start: 2022-11-29 — End: 2022-12-03

## 2022-11-29 MED ORDER — CARVEDILOL 25 MG PO TABS
25.0000 mg | ORAL_TABLET | Freq: Two times a day (BID) | ORAL | 1 refills | Status: DC
Start: 2022-11-29 — End: 2022-12-24

## 2022-11-29 MED ORDER — EMPAGLIFLOZIN 10 MG PO TABS: 10.0000 mg | ORAL_TABLET | Freq: Every day | ORAL | 1 refills | Status: DC

## 2022-11-29 MED ORDER — VALSARTAN 160 MG PO TABS: 160.0000 mg | ORAL_TABLET | Freq: Every day | ORAL | 3 refills | Status: DC

## 2022-11-29 MED ORDER — AMLODIPINE BESYLATE 10 MG PO TABS
10.0000 mg | ORAL_TABLET | Freq: Every day | ORAL | 1 refills | Status: DC
Start: 2022-11-29 — End: 2022-12-24

## 2022-11-29 NOTE — Progress Notes (Unsigned)
[facility]  Jason Bruce, is a 47 y.o. male  [id number]  [id number]  DOB - Sep 24, 1975  Chief Complaint  Patient presents with   Diabetes   Hypertension       Subjective:   Jason Bruce is a 47 y.o. male here today for a follow up visit. Patient has No headache, No chest pain, No abdominal pain - No Nausea, No new weakness tingling or numbness, No Cough - shortness of breath  No problems updated.  Allergies  Allergen Reactions   Shellfish Allergy Anaphylaxis and Swelling    Past Medical History:  Diagnosis Date   Acute hypoxemic respiratory failure due to COVID-19 (HCC) 09/30/2019   AKI (acute kidney injury) (HCC)    Diabetes mellitus without complication (HCC)    Hypertension    Nephrolithiasis    Pneumonia due to COVID-19 virus 09/30/2019   Sigmoid diverticulitis 09/04/2021    Current Outpatient Medications on File Prior to Visit  Medication Sig Dispense Refill   Accu-Chek FastClix Lancets MISC Use to test blood sugar twice a day 100 each 1   Acetaminophen (TYLENOL EXTRA STRENGTH PO) Take 500 mg by mouth.     atorvastatin (LIPITOR) 20 MG tablet Take 1 tablet (20 mg total) by mouth daily. 90 tablet 1   Blood Glucose Monitoring Suppl (ACCU-CHEK GUIDE) w/Device KIT Test blood sugar twice a day 1 kit 0   Dulaglutide (TRULICITY) 0.75 MG/0.5ML SOPN Inject 0.75 mg into the skin once a week. For 4 weeks. Then, increase to the 1.5 mg pen and inject once weekly. 2 mL 0   Dulaglutide (TRULICITY) 1.5 MG/0.5ML SOPN Inject 1.5 mg into the skin once a week. 6 mL 1   glucose blood (ACCU-CHEK GUIDE) test strip Use as instructed 100 each 12   Insulin Pen Needle (BD ULTRA-FINE PEN NEEDLES) 29G X 12.7MM MISC Use with Lantus Solostar pen nightly 100 each 2   Lancets Misc. (ACCU-CHEK FASTCLIX LANCET) KIT Use to test blood sugar twice a day 1 kit 0   ondansetron (ZOFRAN) 4 MG tablet Take 1 tablet (4 mg total) by mouth every 8 (eight) hours as needed for nausea or  vomiting. 12 tablet 0   oxyCODONE-acetaminophen (PERCOCET/ROXICET) 5-325 MG tablet Take 1 tablet by mouth every 6 (six) hours as needed for severe pain. 9 tablet 0   Potassium Citrate 15 MEQ (1620 MG) TBCR      tamsulosin (FLOMAX) 0.4 MG CAPS capsule Take 1 capsule (0.4 mg total) by mouth daily. 30 capsule 0   No current facility-administered medications on file prior to visit.    Objective:   Vitals:   11/29/22 0946 11/29/22 0948 11/29/22 1036  BP: (!) 178/115 (!) 178/128 (!) 162/92  Pulse: 96    Resp: 16    SpO2: 98%    Weight: (!) 338 lb 3.2 oz (153.4 kg)      Comprehensive ROS Pertinent positive and negative noted in HPI   Exam General appearance : Awake, alert, not in any distress. Speech Clear. Not toxic looking HEENT: Atraumatic and Normocephalic, pupils equally reactive to light and accomodation right ear cerumen impaction Neck: Supple, no JVD. No cervical lymphadenopathy.  Chest: Good air entry bilaterally, no added sounds  CVS: S1 S2 regular, no murmurs.  Abdomen: Bowel sounds present, Non tender and not distended with no gaurding, rigidity or rebound. Extremities: B/L Lower Ext shows no edema, both legs are warm to touch Neurology: Awake alert, and oriented X 3, CN II-XII intact, Non focal Skin: No  Rash  Data Review Lab Results  Component Value Date   HGBA1C 10.4 (A) 11/29/2022   HGBA1C 9.5 (A) 08/01/2022   HGBA1C 6.4 12/17/2021    Assessment & Plan  Jason Bruce was seen today for diabetes and hypertension.  Diagnoses and all orders for this visit:  Controlled type 2 diabetes mellitus with microalbuminuria, without long-term current use of insulin (HCC)  Complications from uncontrolled diabetes -diabetic retinopathy leading to blindness, diabetic nephropathy leading to dialysis, decrease in circulation decrease in sores or wound healing which may lead to amputations and increase of heart attack and stroke  -     POCT glycosylated hemoglobin (Hb A1C) -     insulin  glargine (LANTUS SOLOSTAR) 100 UNIT/ML Solostar Pen; Inject 30 Units into the skin at bedtime.  Influenza vaccination declined  BMI 45.0-49.9, adult (HCC) Morbid Obesity is > 40 indicating an excess in caloric intake or underlining conditions. This may lead to other co-morbidities. Educated on lifestyle modifications of diet and exercise which may reduce obesity.   Hypertension due to endocrine disorder BP goal - < 130/80 Explained that having normal blood pressure is the goal and medications are helping to get to goal and maintain normal blood pressure. DIET: Limit salt intake, read nutrition labels to check salt content, limit fried and high fatty foods  Avoid using multisymptom OTC cold preparations that generally contain sudafed which can rise BP. Consult with pharmacist on best cold relief products to use for persons with HTN EXERCISE Discussed incorporating exercise such as walking - 30 minutes most days of the week and can do in 10 minute intervals    -     valsartan (DIOVAN) 160 MG tablet; Take 1 tablet (160 mg total) by mouth daily. -     amLODipine (NORVASC) 10 MG tablet; Take 1 tablet (10 mg total) by mouth daily. -     carvedilol (COREG) 25 MG tablet; Take 1 tablet (25 mg total) by mouth 2 (two) times daily with a meal.  Mixed hyperlipidemia -     Lipid Panel  Hypokalemia -     CMP14+EGFR  Other orders -     hydrALAZINE (APRESOLINE) 100 MG tablet; Take 0.5 tablets (50 mg total) by mouth 3 (three) times daily. TAKE 1 TABLET(100 MG) BY MOUTH THREE TIMES DAILY -     empagliflozin (JARDIANCE) 10 MG TABS tablet; Take 1 tablet (10 mg total) by mouth daily before breakfast. -     CMP14+EGFR    Patient have been counseled extensively about nutrition and exercise. Other issues discussed during this visit include: low cholesterol diet, weight control and daily exercise, foot care, annual eye examinations at Ophthalmology, importance of adherence with medications and regular follow-up.  We also discussed long term complications of uncontrolled diabetes and hypertension.   Return in about 3 months (around 03/01/2023) for medical conditions.  The patient was given clear instructions to go to ER or return to medical center if symptoms don't improve, worsen or new problems develop. The patient verbalized understanding. The patient was told to call to get lab results if they haven't heard anything in the next week.   This note has been created with Education officer, environmental. Any transcriptional errors are unintentional.   Grayce Sessions, NP 12/02/2022, 8:41 AM

## 2022-11-29 NOTE — Telephone Encounter (Signed)
Will forward to provider  PLEASE LOOK AT DIRECTIONS

## 2022-11-30 LAB — CMP14+EGFR
ALT: 25 [IU]/L (ref 0–44)
AST: 27 [IU]/L (ref 0–40)
Albumin: 4.4 g/dL (ref 4.1–5.1)
Alkaline Phosphatase: 89 [IU]/L (ref 44–121)
BUN/Creatinine Ratio: 11 (ref 9–20)
BUN: 14 mg/dL (ref 6–24)
Bilirubin Total: 0.5 mg/dL (ref 0.0–1.2)
CO2: 22 mmol/L (ref 20–29)
Calcium: 9.4 mg/dL (ref 8.7–10.2)
Chloride: 97 mmol/L (ref 96–106)
Creatinine, Ser: 1.28 mg/dL — ABNORMAL HIGH (ref 0.76–1.27)
Globulin, Total: 3.4 g/dL (ref 1.5–4.5)
Glucose: 218 mg/dL — ABNORMAL HIGH (ref 70–99)
Potassium: 4.2 mmol/L (ref 3.5–5.2)
Sodium: 137 mmol/L (ref 134–144)
Total Protein: 7.8 g/dL (ref 6.0–8.5)
eGFR: 69 mL/min/{1.73_m2} (ref >59)

## 2022-11-30 LAB — LIPID PANEL
Chol/HDL Ratio: 7.4 {ratio} — ABNORMAL HIGH (ref 0.0–5.0)
Cholesterol, Total: 335 mg/dL — ABNORMAL HIGH (ref 100–199)
HDL: 45 mg/dL (ref >39)
LDL Chol Calc (NIH): 252 mg/dL — ABNORMAL HIGH (ref 0–99)
Triglycerides: 190 mg/dL — ABNORMAL HIGH (ref 0–149)
VLDL Cholesterol Cal: 38 mg/dL (ref 5–40)

## 2022-12-02 ENCOUNTER — Other Ambulatory Visit: Payer: Self-pay

## 2022-12-02 ENCOUNTER — Other Ambulatory Visit (INDEPENDENT_AMBULATORY_CARE_PROVIDER_SITE_OTHER): Payer: Self-pay | Admitting: Primary Care

## 2022-12-02 MED ORDER — ATORVASTATIN CALCIUM 80 MG PO TABS: 80.0000 mg | ORAL_TABLET | Freq: Every day | ORAL | 1 refills | Status: DC

## 2022-12-03 ENCOUNTER — Other Ambulatory Visit (INDEPENDENT_AMBULATORY_CARE_PROVIDER_SITE_OTHER): Payer: Self-pay | Admitting: Primary Care

## 2022-12-03 MED ORDER — HYDRALAZINE HCL 100 MG PO TABS: 100.0000 mg | ORAL_TABLET | Freq: Two times a day (BID) | ORAL | 1 refills | Status: DC

## 2022-12-04 ENCOUNTER — Other Ambulatory Visit: Payer: Self-pay

## 2022-12-09 ENCOUNTER — Ambulatory Visit (INDEPENDENT_AMBULATORY_CARE_PROVIDER_SITE_OTHER): Payer: Medicaid Other | Admitting: Primary Care

## 2022-12-09 ENCOUNTER — Telehealth (INDEPENDENT_AMBULATORY_CARE_PROVIDER_SITE_OTHER): Payer: Self-pay | Admitting: Primary Care

## 2022-12-09 NOTE — Telephone Encounter (Signed)
Left VM with pt to make apt virtual. Pt did not answer and if pt does not call back apt will be canceled and we can reschedule another apt.

## 2022-12-10 ENCOUNTER — Telehealth (INDEPENDENT_AMBULATORY_CARE_PROVIDER_SITE_OTHER): Payer: Medicaid Other | Admitting: Primary Care

## 2022-12-13 DIAGNOSIS — Z419 Encounter for procedure for purposes other than remedying health state, unspecified: Secondary | ICD-10-CM | POA: Diagnosis not present

## 2022-12-23 NOTE — Progress Notes (Unsigned)
S:    47 y.o. male who presents for hypertension evaluation, education, and management. Patient was referred and last seen by Primary Care Provider, [name], on 11/29/22. At this last visit, BP was 162/92. Difficult to determine whether adjustments to hydralazine were made.  PMH is significant for HTN, T2DM, HLD, OSA, diverticulitis, and obesity. Concern for medication non adherence as BP meds were not filled between 01/2022 and 11/2022.  Today, patient arrives in *** spirits and presents without *** assistance. *** Denies dizziness, headache, blurred vision, swelling.   Patient reports hypertension was diagnosed in ***.   Family/Social history: -Fhx: HTN, DM -Tobacco/alcohol: never  Medication adherence *** . Patient has *** taken BP medications today.   Current antihypertensives include: amlodipine 10mg  daily, carvedilol 25mg  BID, hydralazine 100mg  BID, valsartan 160mg  daily  Antihypertensives tried in the past include: HCTZ (held d/t AKI in 2021), lisinopril (held d/t AKI in 2021), losartan (switched to valsartan for better efficacy), metoprolol tartrate (changed to carvedilol for better BP lowering)   Reported home BP readings: ***  Patient reported dietary habits: Eats *** meals/day Breakfast: *** Lunch: *** Dinner: *** Snacks: *** Drinks: ***  Patient-reported exercise habits: ***  O:  Last 3 Office BP readings: BP Readings from Last 3 Encounters:  11/29/22 (!) 162/92  08/13/22 139/83  08/01/22 (!) 184/127   BMET    Component Value Date/Time   NA 137 11/29/2022 1113   NA 142 08/11/2011 2105   K 4.2 11/29/2022 1113   K 4.2 08/11/2011 2105   CL 97 11/29/2022 1113   CL 104 08/11/2011 2105   CO2 22 11/29/2022 1113   CO2 30 08/11/2011 2105   GLUCOSE 218 (H) 11/29/2022 1113   GLUCOSE 234 (H) 07/24/2022 0654   GLUCOSE 102 (H) 08/11/2011 2105   BUN 14 11/29/2022 1113   BUN 14 08/11/2011 2105   CREATININE 1.28 (H) 11/29/2022 1113   CREATININE 1.06  08/11/2011 2105   CALCIUM 9.4 11/29/2022 1113   CALCIUM 9.2 08/11/2011 2105   GFRNONAA >60 07/12/2022 0936   GFRNONAA >60 08/11/2011 2105   GFRAA 119 11/22/2019 1208   GFRAA >60 08/11/2011 2105    Renal function: CrCl cannot be calculated (Patient's most recent lab result is older than the maximum 21 days allowed.).  Clinical ASCVD: No  The ASCVD Risk score (Arnett DK, et al., 2019) failed to calculate for the following reasons:   The valid total cholesterol range is 130 to 320 mg/dL  Patient is participating in a Managed Medicaid Plan:  Yes   A/P: Hypertension diagnosed *** currently *** on current medications. BP goal < 130/80 *** mmHg. Medication adherence appears ***. Control is suboptimal due to ***.  -Continue amlodipine 10mg  daily -Continue carvedilol 25mg  BID -Continue hydralazine 100mg  BID -?Increase valsartan to 320mg  daily pending updated CMP (Scr 0.9 > 1.28 (~42% increase) on most recent CMP on 10/18), or -?Start valsartan-hydrochlorothiazide 160-12.5mg  pending updated CMP (as above) -F/u labs ordered - CMP -Patient educated on purpose, proper use, and potential adverse effects of ***.  -Counseled on lifestyle modifications for blood pressure control including reduced dietary sodium, increased exercise, adequate sleep. -Encouraged patient to check BP at home and bring log of readings to next visit. Counseled on proper use of home BP cuff.   Results reviewed and written information provided.    Written patient instructions provided. Patient verbalized understanding of treatment plan.  Total time in face to face counseling *** minutes.    Follow-up:  Pharmacist: *** PCP  clinic visit: 03/03/23  Nicole Kindred, PharmD PGY1 Pharmacy Resident 12/23/2022 9:52 PM

## 2022-12-24 ENCOUNTER — Encounter: Payer: Self-pay | Admitting: Pharmacist

## 2022-12-24 ENCOUNTER — Ambulatory Visit: Payer: Medicaid Other | Attending: Primary Care | Admitting: Pharmacist

## 2022-12-24 VITALS — BP 197/133 | HR 90

## 2022-12-24 DIAGNOSIS — I152 Hypertension secondary to endocrine disorders: Secondary | ICD-10-CM | POA: Diagnosis not present

## 2022-12-24 DIAGNOSIS — Z7985 Long-term (current) use of injectable non-insulin antidiabetic drugs: Secondary | ICD-10-CM

## 2022-12-24 DIAGNOSIS — Z7984 Long term (current) use of oral hypoglycemic drugs: Secondary | ICD-10-CM

## 2022-12-24 MED ORDER — METFORMIN HCL ER 500 MG PO TB24: 500.0000 mg | ORAL_TABLET | Freq: Two times a day (BID) | ORAL | 1 refills | Status: DC

## 2022-12-24 MED ORDER — ATORVASTATIN CALCIUM 80 MG PO TABS: 80.0000 mg | ORAL_TABLET | Freq: Every day | ORAL | 1 refills | Status: DC

## 2022-12-24 MED ORDER — OZEMPIC (0.25 OR 0.5 MG/DOSE) 2 MG/3ML ~~LOC~~ SOPN: 0.2500 mg | PEN_INJECTOR | SUBCUTANEOUS | 1 refills | Status: DC

## 2022-12-24 MED ORDER — AMLODIPINE BESYLATE 5 MG PO TABS: 5.0000 mg | ORAL_TABLET | Freq: Every day | ORAL | 1 refills | Status: DC

## 2022-12-24 MED ORDER — VALSARTAN 160 MG PO TABS: 160.0000 mg | ORAL_TABLET | Freq: Every day | ORAL | 1 refills | Status: DC

## 2022-12-25 LAB — BMP8+EGFR
BUN/Creatinine Ratio: 15 (ref 9–20)
BUN: 18 mg/dL (ref 6–24)
CO2: 23 mmol/L (ref 20–29)
Calcium: 9.8 mg/dL (ref 8.7–10.2)
Chloride: 97 mmol/L (ref 96–106)
Creatinine, Ser: 1.2 mg/dL (ref 0.76–1.27)
Glucose: 253 mg/dL — ABNORMAL HIGH (ref 70–99)
Potassium: 4.5 mmol/L (ref 3.5–5.2)
Sodium: 138 mmol/L (ref 134–144)
eGFR: 75 mL/min/{1.73_m2} (ref >59)

## 2022-12-25 LAB — MICROALBUMIN / CREATININE URINE RATIO
Creatinine, Urine: 117.9 mg/dL
Microalb/Creat Ratio: 291 mg/g{creat} — ABNORMAL HIGH (ref 0–29)
Microalbumin, Urine: 342.9 ug/mL

## 2022-12-26 ENCOUNTER — Other Ambulatory Visit: Payer: Self-pay

## 2023-01-06 NOTE — Progress Notes (Unsigned)
S:    47 y.o. male who presents for hypertension evaluation, education, and management. PMH is significant for HTN, T2DM, HLD, OSA, diverticulitis, and obesity. Of note in 2023, A1c 6.8% on Jardiance, Lantus, metformin, and Trulicity and BP at goal on amlodipine, carvedilol, hydralazine, and valsartan.  Patient was referred and last seen by Primary Care Provider, [name], on 11/29/22. At this last visit, BP was 162/92 and hydralazine was adjusted from TID to BID. At last PharmD visit, patient's wife reported he had not taken any medications for the past year. BP was 197/133. Patient was agreeable to restarting 4 of his medications: amlodipine, valsartan, and metformin. Ozempic and atorvastatin 80mg  were also started.  Today, patient arrives in good spirits with no complaints, accompanied by his wife. Denies dizziness, headaches, and swelling. Adherence *** Side effects **  Family/Social history: -Started working at [employer] recently which he notes as high stress environment -Fhx: HTN, DM -Tobacco/alcohol: never  Medications Medication adherence is poor as noted above. Not taking any medications. HTN -Current meds: amlodipine 5mg  daily, valsartan 160mg  daily, carvedilol 25mg  BID (not restarted), hydralazine 100mg  BID (not restarted) -Previous meds: CTZ (held d/t AKI in 2021), lisinopril (held d/t AKI in 2021), losartan (switched to valsartan for better efficacy), metoprolol tartrate (changed to carvedilol for better BP lowering)   DM -Current meds: Ozempic 0.25mg  weekly, metformin 1000mg  BID, Lantus 30 units daily (not restarted) -Previous meds: Trulicity 0.75mg   HLD -Current meds: atorvastatin 80mg  daily  Monitoring HTN Reported home BP readings:   DM Reported home BG readings: ***  Denies hypoglycemic events. ***  *** Reports nocturia - 4-5x/night. Reports neuropathy in fingers in AM. Denies vision changes. Reports increased thirst. Reports skin darkening on  feet when eating too much pasta.  Lifestyle Patient reported dietary habits: Eats 3 meals/day; mainly home cooked meals prepared by wife who limits amount of seasoning on food, eats out every now and then; wife helps him watch his diet Breakfast: eggs, turkey/beef sausage; if eating out - egg, steak, cheese bagel or egg platter Lunch: [employer] or [employer], burger (no McDonalds), wife may prepare lunch for him since she works from Conservation officer, historic buildings: trying to back off steaks, fish (loves salmon), chicken (air fried or grilled) Snacks: veggie straws, small bag of chips, stove top popcorn, baked chips Drinks: Zero sugar drinks - Attempting to reduce sugar intake, doesn't do candy and sweets - 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  Patient-reported exercise habits: - Runs up steps in house [employer] with other men at group home when they would like to get out into community - Has exercise machine at home but hasn't started using  O:  Last 3 Office BP readings: BP Readings from Last 3 Encounters:  12/24/22 (!) 197/133  11/29/22 (!) 162/92  08/13/22 139/83   BMET    Component Value Date/Time   NA 138 12/24/2022 1229   NA 142 08/11/2011 2105   K 4.5 12/24/2022 1229   K 4.2 08/11/2011 2105   CL 97 12/24/2022 1229   CL 104 08/11/2011 2105   CO2 23 12/24/2022 1229   CO2 30 08/11/2011 2105   GLUCOSE 253 (H) 12/24/2022 1229   GLUCOSE 234 (H) 07/24/2022 0654   GLUCOSE 102 (H) 08/11/2011 2105   BUN 18 12/24/2022 1229   BUN 14 08/11/2011 2105   CREATININE 1.20 12/24/2022 1229   CREATININE 1.06 08/11/2011 2105   CALCIUM 9.8 12/24/2022  1229   CALCIUM 9.2 08/11/2011 2105   GFRNONAA >60 07/12/2022 0936   GFRNONAA >60 08/11/2011 2105   GFRAA 119 11/22/2019 1208   GFRAA >60 08/11/2011 2105    Renal function: CrCl cannot be calculated (Unknown ideal weight.).  Clinical ASCVD: No  The ASCVD Risk score (Arnett DK, et al., 2019) failed to calculate  for the following reasons:   The valid total cholesterol range is 130 to 320 mg/dL  Patient is participating in a Managed Medicaid Plan:  Yes   A/P: Hypertension very uncontrolled off of medications. BP goal < 130/80 mmHg. Control is suboptimal due to lack of medication adherence. As patient is concerned for medication adverse effects and has been off medications for close to a year, will start back only two agents at this time. Originally planned on waiting to restart valsartan until able to see results from updated labs given elevated Scr on 11/29/22, however, office BP shows the need for starting at least 2 BP agents ASAP. Will consider adding back previous BP medications in the future once patient is able to remain adherent and tolerant of amlodipine and valsartan. -Continue amlodipine 5mg  daily -Continue valsartan 160mg  daily -Hold off on restarting carvedilol 25mg  BID -Hold off on restarting hydralazine 100mg  BID -F/u labs ordered - CMP -Counseled on lifestyle modifications for blood pressure control including reduced dietary sodium, increased exercise, adequate sleep. -Encouraged patient to check BP at home and bring log of readings to next visit. Counseled on proper use of home BP cuff.   Diabetes - uncontrolled with most recent A1c of 10.4% (11/2022), which is up from 9.5% (07/2022). No recent home CBGs recorded. *** UACR from last visit showing worsening nephropathy. Control suboptimal due to lack of medication adherence. Due to patient's concern for adverse effects, will only restart GLP1 and metformin at this time to reduce risk of relative hypoglycemia symptoms. Patient previously on Trulicity, but this was stopped due to insurance issues. Patient requests to start Ozempic rather than Trulicity as he wasn't sure the Trulicity was working. Will keep patient off Jardiance at this time due to high A1c. -Continue Ozempic 0.25mg  once weekly -Continue metformin XR at 500mg  BID with  meals -Hold off on restarting Lantus  ASCVD risk - primary prevention in setting of diabetes. LDL 252 on most recent lipid panel obtained on 11/29/22, not at goal of < 70 mg/dL. High-intensity statin indicated for LDL > 190 mg/dL. Atorvastatin was increased from 20mg  to 80mg  by PCP, however, patient has not picked up new script. -Continue atorvastatin 80mg  daily  Results reviewed and written information provided.    Written patient instructions provided. Patient verbalized understanding of treatment plan.  Total time in face to face counseling *** minutes.    Follow-up:  Pharmacist: *** PCP clinic visit: 03/03/23  Nicole Kindred, PharmD PGY1 Pharmacy Resident 01/06/2023 9:10 PM

## 2023-01-07 ENCOUNTER — Ambulatory Visit: Payer: Self-pay | Admitting: Pharmacist

## 2023-01-12 DIAGNOSIS — Z419 Encounter for procedure for purposes other than remedying health state, unspecified: Secondary | ICD-10-CM | POA: Diagnosis not present

## 2023-01-15 ENCOUNTER — Other Ambulatory Visit: Payer: Self-pay

## 2023-02-12 DIAGNOSIS — Z419 Encounter for procedure for purposes other than remedying health state, unspecified: Secondary | ICD-10-CM | POA: Diagnosis not present

## 2023-03-03 ENCOUNTER — Ambulatory Visit (INDEPENDENT_AMBULATORY_CARE_PROVIDER_SITE_OTHER): Payer: Medicaid Other | Admitting: Primary Care

## 2023-03-04 ENCOUNTER — Ambulatory Visit (INDEPENDENT_AMBULATORY_CARE_PROVIDER_SITE_OTHER): Payer: Medicaid Other | Admitting: Primary Care

## 2023-03-15 DIAGNOSIS — Z419 Encounter for procedure for purposes other than remedying health state, unspecified: Secondary | ICD-10-CM | POA: Diagnosis not present

## 2023-04-12 DIAGNOSIS — Z419 Encounter for procedure for purposes other than remedying health state, unspecified: Secondary | ICD-10-CM | POA: Diagnosis not present

## 2023-04-15 ENCOUNTER — Telehealth (INDEPENDENT_AMBULATORY_CARE_PROVIDER_SITE_OTHER): Payer: Self-pay | Admitting: Primary Care

## 2023-04-15 NOTE — Telephone Encounter (Signed)
 Spoke to pt about atp.. Will be present

## 2023-04-21 ENCOUNTER — Ambulatory Visit (INDEPENDENT_AMBULATORY_CARE_PROVIDER_SITE_OTHER): Admitting: Primary Care

## 2023-04-21 ENCOUNTER — Encounter (INDEPENDENT_AMBULATORY_CARE_PROVIDER_SITE_OTHER): Payer: Self-pay | Admitting: Primary Care

## 2023-04-21 VITALS — BP 140/92 | HR 88 | Temp 98.7°F | Ht 69.0 in | Wt 334.6 lb

## 2023-04-21 DIAGNOSIS — L659 Nonscarring hair loss, unspecified: Secondary | ICD-10-CM

## 2023-04-21 DIAGNOSIS — I152 Hypertension secondary to endocrine disorders: Secondary | ICD-10-CM

## 2023-04-21 MED ORDER — AMLODIPINE BESYLATE 10 MG PO TABS: 10.0000 mg | ORAL_TABLET | Freq: Every day | ORAL | 1 refills | Status: DC

## 2023-04-21 NOTE — Progress Notes (Unsigned)
 Asking for dermatology referral.

## 2023-04-21 NOTE — Progress Notes (Unsigned)
 [facility]  Jason Bruce, is a 48 y.o. male  [id number]  [id number]  DOB - 1975-05-27  Chief Complaint  Patient presents with   Medical Management of Chronic Issues       Subjective:   Jason Bruce is a 48 y.o. male here today for a acute visit.  Patient has noticed that the middle of his head he is losing hair.  This area is also where he passed and scratches when stressed.  Also, made patient aware that alopecia can come from hypertension   metabolic syndrome Central obesity of their cardiovascular risk factors. He does admit to being under a lot of stress.  He also has a family history where his father has frontal hair loss.  Blood pressure is elevated today he does state he had a stressful day at work/night and he has taken his medication patient has No headache, No chest pain, No abdominal pain - No Nausea, No new weakness tingling or numbness, No Cough - shortness of breath  No problems updated.  Comprehensive ROS Pertinent positive and negative noted in HPI   Allergies  Allergen Reactions   Shellfish Allergy Anaphylaxis and Swelling    Past Medical History:  Diagnosis Date   Acute hypoxemic respiratory failure due to COVID-19 (HCC) 09/30/2019   AKI (acute kidney injury) (HCC)    Diabetes mellitus without complication (HCC)    Hypertension    Nephrolithiasis    Pneumonia due to COVID-19 virus 09/30/2019   Sigmoid diverticulitis 09/04/2021    Current Outpatient Medications on File Prior to Visit  Medication Sig Dispense Refill   Accu-Chek FastClix Lancets MISC Use to test blood sugar twice a day 100 each 1   atorvastatin (LIPITOR) 80 MG tablet Take 1 tablet (80 mg total) by mouth daily. 90 tablet 1   Blood Glucose Monitoring Suppl (ACCU-CHEK GUIDE) w/Device KIT Test blood sugar twice a day 1 kit 0   glucose blood (ACCU-CHEK GUIDE) test strip Use as instructed 100 each 12   Insulin Pen Needle (BD ULTRA-FINE PEN NEEDLES) 29G X 12.7MM MISC Use  with Lantus Solostar pen nightly 100 each 2   Lancets Misc. (ACCU-CHEK FASTCLIX LANCET) KIT Use to test blood sugar twice a day 1 kit 0   metFORMIN (GLUCOPHAGE-XR) 500 MG 24 hr tablet Take 1 tablet (500 mg total) by mouth 2 (two) times daily with a meal. 180 tablet 1   Semaglutide,0.25 or 0.5MG /DOS, (OZEMPIC, 0.25 OR 0.5 MG/DOSE,) 2 MG/3ML SOPN Inject 0.25 mg into the skin once a week. For 4 weeks. Then, increase to 0.5 mg subcutaneously once weekly for 4 weeks. 3 mL 1   valsartan (DIOVAN) 160 MG tablet Take 1 tablet (160 mg total) by mouth daily. 90 tablet 1   ondansetron (ZOFRAN) 4 MG tablet Take 1 tablet (4 mg total) by mouth every 8 (eight) hours as needed for nausea or vomiting. (Patient not taking: Reported on 04/21/2023) 12 tablet 0   oxyCODONE-acetaminophen (PERCOCET/ROXICET) 5-325 MG tablet Take 1 tablet by mouth every 6 (six) hours as needed for severe pain. (Patient not taking: Reported on 04/21/2023) 9 tablet 0   tamsulosin (FLOMAX) 0.4 MG CAPS capsule Take 1 capsule (0.4 mg total) by mouth daily. 30 capsule 0   No current facility-administered medications on file prior to visit.   Health Maintenance  Topic Date Due   COVID-19 Vaccine (1) Never done   Eye exam for diabetics  Never done   Pneumococcal Vaccination (2 of 2 - PCV) 11/21/2020  Complete foot exam   07/06/2022   Flu Shot  05/12/2023*   Hemoglobin A1C  05/30/2023   Yearly kidney function blood test for diabetes  12/24/2023   Yearly kidney health urinalysis for diabetes  12/24/2023   Colon Cancer Screening  09/09/2027   DTaP/Tdap/Td vaccine (2 - Td or Tdap) 11/21/2029   Hepatitis C Screening  Completed   HIV Screening  Completed   HPV Vaccine  Aged Out  *Topic was postponed. The date shown is not the original due date.    Objective:   Vitals:   04/21/23 1102 04/21/23 1113 04/21/23 1118 04/21/23 1148  BP: (!) 193/139 (!) 195/122 (!) 180/110 (!) 140/92  Pulse: 88     Temp: 98.7 F (37.1 C)     TempSrc: Oral      SpO2: 97%     Weight: (!) 334 lb 9.6 oz (151.8 kg)     Height: 5\' 9"  (1.753 m)      BP Readings from Last 3 Encounters:  04/21/23 (!) 140/92  12/24/22 (!) 197/133  11/29/22 (!) 162/92   Physical Exam Vitals reviewed.  Constitutional:      Appearance: Normal appearance. He is obese.     Comments: morbid  HENT:     Head: Normocephalic.     Right Ear: Tympanic membrane and external ear normal.     Left Ear: Tympanic membrane and external ear normal.     Nose: Nose normal.  Eyes:     Extraocular Movements: Extraocular movements intact.     Pupils: Pupils are equal, round, and reactive to light.  Cardiovascular:     Rate and Rhythm: Normal rate and regular rhythm.  Pulmonary:     Effort: Pulmonary effort is normal.     Breath sounds: Normal breath sounds.  Abdominal:     General: Bowel sounds are normal. There is distension.     Palpations: Abdomen is soft.  Musculoskeletal:        General: Normal range of motion.  Skin:    General: Skin is warm and dry.  Neurological:     Mental Status: He is oriented to person, place, and time.  Psychiatric:        Mood and Affect: Mood normal.        Behavior: Behavior normal.        Thought Content: Thought content normal.        Judgment: Judgment normal.       Assessment & Plan  [name] was seen today for medical management of chronic issues.  Diagnoses and all orders for this visit:  Alopecia  Initial Bp tx with clonidine .2mg -- afterwards 140/92  Hypertension due to endocrine disorder BP goal - < 130/80 Explained that having normal blood pressure is the goal and medications are helping to get to goal and maintain normal blood pressure. DIET: Limit salt intake, read nutrition labels to check salt content, limit fried and high fatty foods  Avoid using multisymptom OTC cold preparations that generally contain sudafed which can rise BP. Consult with pharmacist on best cold relief products to use for persons with  HTN EXERCISE Discussed incorporating exercise such as walking - 30 minutes most days of the week and can do in 10 minute intervals    Increased amlodipine from 5 mg to 10 and continue his valsartan 160 return in 2 weeks for blood pressure check  Other orders -     amLODipine (NORVASC) 10 MG tablet; Take 1 tablet (10 mg total) by  mouth daily.     Patient have been counseled extensively about nutrition and exercise. Other issues discussed during this visit include: low cholesterol diet, weight control and daily exercise, foot care, annual eye examinations at Ophthalmology, importance of adherence with medications and regular follow-up. We also discussed long term complications of uncontrolled diabetes and hypertension.   Return in about 3 months (around 07/22/2023) for fasting labs/HTN.  The patient was given clear instructions to go to ER or return to medical center if symptoms don't improve, worsen or new problems develop. The patient verbalized understanding. The patient was told to call to get lab results if they haven't heard anything in the next week.   This note has been created with Education officer, environmental. Any transcriptional errors are unintentional.   Grayce Sessions, NP 04/21/2023, 3:02 PM

## 2023-05-06 ENCOUNTER — Ambulatory Visit (INDEPENDENT_AMBULATORY_CARE_PROVIDER_SITE_OTHER): Admitting: Primary Care

## 2023-05-06 ENCOUNTER — Encounter (INDEPENDENT_AMBULATORY_CARE_PROVIDER_SITE_OTHER): Payer: Self-pay | Admitting: Primary Care

## 2023-05-06 DIAGNOSIS — I152 Hypertension secondary to endocrine disorders: Secondary | ICD-10-CM

## 2023-05-06 MED ORDER — VALSARTAN 160 MG PO TABS: 160.0000 mg | ORAL_TABLET | Freq: Every day | ORAL | 1 refills | Status: DC

## 2023-05-06 NOTE — Progress Notes (Signed)
   Blood Pressure Recheck Visit  Name: Jason Bruce MRN: [id number] Date of Birth: [birth year]  TEDRIC LEETH presents today for Blood Pressure recheck with clinical support staff.  Order for BP recheck by Dalene Carrow , ordered on 05/06/23.   BP Readings from Last 3 Encounters:  04/21/23 (!) 140/92  12/24/22 (!) 197/133  11/29/22 (!) 162/92    Current Outpatient Medications  Medication Sig Dispense Refill   Accu-Chek FastClix Lancets MISC Use to test blood sugar twice a day 100 each 1   amLODipine (NORVASC) 10 MG tablet Take 1 tablet (10 mg total) by mouth daily. 90 tablet 1   atorvastatin (LIPITOR) 80 MG tablet Take 1 tablet (80 mg total) by mouth daily. 90 tablet 1   Blood Glucose Monitoring Suppl (ACCU-CHEK GUIDE) w/Device KIT Test blood sugar twice a day 1 kit 0   glucose blood (ACCU-CHEK GUIDE) test strip Use as instructed 100 each 12   Insulin Pen Needle (BD ULTRA-FINE PEN NEEDLES) 29G X 12.7MM MISC Use with Lantus Solostar pen nightly 100 each 2   Lancets Misc. (ACCU-CHEK FASTCLIX LANCET) KIT Use to test blood sugar twice a day 1 kit 0   metFORMIN (GLUCOPHAGE-XR) 500 MG 24 hr tablet Take 1 tablet (500 mg total) by mouth 2 (two) times daily with a meal. 180 tablet 1   ondansetron (ZOFRAN) 4 MG tablet Take 1 tablet (4 mg total) by mouth every 8 (eight) hours as needed for nausea or vomiting. (Patient not taking: Reported on 04/21/2023) 12 tablet 0   oxyCODONE-acetaminophen (PERCOCET/ROXICET) 5-325 MG tablet Take 1 tablet by mouth every 6 (six) hours as needed for severe pain. (Patient not taking: Reported on 04/21/2023) 9 tablet 0   Semaglutide,0.25 or 0.5MG /DOS, (OZEMPIC, 0.25 OR 0.5 MG/DOSE,) 2 MG/3ML SOPN Inject 0.25 mg into the skin once a week. For 4 weeks. Then, increase to 0.5 mg subcutaneously once weekly for 4 weeks. 3 mL 1   tamsulosin (FLOMAX) 0.4 MG CAPS capsule Take 1 capsule (0.4 mg total) by mouth daily. 30 capsule 0   valsartan (DIOVAN) 160 MG tablet Take 1 tablet  (160 mg total) by mouth daily. 90 tablet 1   No current facility-administered medications for this visit.    Hypertensive Medication Review: Patient states that they are taking all their hypertensive medications as prescribed and their last dose of hypertensive medications was yesterday   Documentation of any medication adherence discrepancies: none  Provider Recommendation:  Spoke to [name], NP   and they stated: Advised patient to take medication daily and come back in 3 days for another recheck.  Patient has been scheduled to follow up with 05/09/23 at 1135 am   Patient has been given provider's recommendations and does not have any questions or concerns at this time. Patient will contact the office for any future questions or concerns.

## 2023-05-09 ENCOUNTER — Ambulatory Visit (INDEPENDENT_AMBULATORY_CARE_PROVIDER_SITE_OTHER)

## 2023-05-09 VITALS — BP 144/88

## 2023-05-09 DIAGNOSIS — Z0130 Encounter for examination of blood pressure without abnormal findings: Secondary | ICD-10-CM

## 2023-05-09 NOTE — Progress Notes (Signed)
   Blood Pressure Recheck Visit  Name: Jason Bruce MRN: [id number] Date of Birth: [birth year]  Jason Bruce presents today for Blood Pressure recheck with clinical support staff.  Order for BP recheck by Dalene Carrow, ordered on 05/06/2023.   BP Readings from Last 3 Encounters:  05/09/23 (!) 144/88  05/06/23 (!) 160/100  04/21/23 (!) 140/92    Current Outpatient Medications  Medication Sig Dispense Refill   Accu-Chek FastClix Lancets MISC Use to test blood sugar twice a day 100 each 1   amLODipine (NORVASC) 10 MG tablet Take 1 tablet (10 mg total) by mouth daily. 90 tablet 1   atorvastatin (LIPITOR) 80 MG tablet Take 1 tablet (80 mg total) by mouth daily. 90 tablet 1   Blood Glucose Monitoring Suppl (ACCU-CHEK GUIDE) w/Device KIT Test blood sugar twice a day 1 kit 0   glucose blood (ACCU-CHEK GUIDE) test strip Use as instructed 100 each 12   Insulin Pen Needle (BD ULTRA-FINE PEN NEEDLES) 29G X 12.7MM MISC Use with Lantus Solostar pen nightly 100 each 2   Lancets Misc. (ACCU-CHEK FASTCLIX LANCET) KIT Use to test blood sugar twice a day 1 kit 0   metFORMIN (GLUCOPHAGE-XR) 500 MG 24 hr tablet Take 1 tablet (500 mg total) by mouth 2 (two) times daily with a meal. 180 tablet 1   ondansetron (ZOFRAN) 4 MG tablet Take 1 tablet (4 mg total) by mouth every 8 (eight) hours as needed for nausea or vomiting. (Patient not taking: Reported on 04/21/2023) 12 tablet 0   oxyCODONE-acetaminophen (PERCOCET/ROXICET) 5-325 MG tablet Take 1 tablet by mouth every 6 (six) hours as needed for severe pain. (Patient not taking: Reported on 04/21/2023) 9 tablet 0   Semaglutide,0.25 or 0.5MG /DOS, (OZEMPIC, 0.25 OR 0.5 MG/DOSE,) 2 MG/3ML SOPN Inject 0.25 mg into the skin once a week. For 4 weeks. Then, increase to 0.5 mg subcutaneously once weekly for 4 weeks. 3 mL 1   tamsulosin (FLOMAX) 0.4 MG CAPS capsule Take 1 capsule (0.4 mg total) by mouth daily. 30 capsule 0   valsartan (DIOVAN) 160 MG tablet Take 1 tablet  (160 mg total) by mouth daily. 90 each 1   No current facility-administered medications for this visit.    Hypertensive Medication Review: Patient states that they are taking all their hypertensive medications as prescribed and their last dose of hypertensive medications was this morning   Documentation of any medication adherence discrepancies: none  Provider Recommendation:  Spoke to [name] and they stated: patient is to return in 1 month  Patient has been scheduled to follow up with [name]   Patient has been given provider's recommendations and does not have any questions or concerns at this time. Patient will contact the office for any future questions or concerns.

## 2023-05-24 DIAGNOSIS — Z419 Encounter for procedure for purposes other than remedying health state, unspecified: Secondary | ICD-10-CM | POA: Diagnosis not present

## 2023-06-03 ENCOUNTER — Telehealth (INDEPENDENT_AMBULATORY_CARE_PROVIDER_SITE_OTHER): Payer: Self-pay | Admitting: Primary Care

## 2023-06-03 NOTE — Telephone Encounter (Signed)
 Called pt about upcoming appt. Pt will be present.

## 2023-06-10 ENCOUNTER — Encounter (INDEPENDENT_AMBULATORY_CARE_PROVIDER_SITE_OTHER): Admitting: Primary Care

## 2023-06-23 DIAGNOSIS — Z419 Encounter for procedure for purposes other than remedying health state, unspecified: Secondary | ICD-10-CM | POA: Diagnosis not present

## 2023-07-24 DIAGNOSIS — Z419 Encounter for procedure for purposes other than remedying health state, unspecified: Secondary | ICD-10-CM | POA: Diagnosis not present

## 2023-08-23 DIAGNOSIS — Z419 Encounter for procedure for purposes other than remedying health state, unspecified: Secondary | ICD-10-CM | POA: Diagnosis not present

## 2023-09-23 DIAGNOSIS — Z419 Encounter for procedure for purposes other than remedying health state, unspecified: Secondary | ICD-10-CM | POA: Diagnosis not present

## 2023-10-15 ENCOUNTER — Emergency Department (HOSPITAL_COMMUNITY)
Admission: EM | Admit: 2023-10-15 | Discharge: 2023-10-16 | Disposition: A | Discharge status: Home / Self Care | Attending: Emergency Medicine | Admitting: Emergency Medicine

## 2023-10-15 ENCOUNTER — Emergency Department (HOSPITAL_COMMUNITY)

## 2023-10-15 DIAGNOSIS — R918 Other nonspecific abnormal finding of lung field: Secondary | ICD-10-CM | POA: Diagnosis not present

## 2023-10-15 DIAGNOSIS — Z7984 Long term (current) use of oral hypoglycemic drugs: Secondary | ICD-10-CM | POA: Diagnosis not present

## 2023-10-15 DIAGNOSIS — Z794 Long term (current) use of insulin: Secondary | ICD-10-CM | POA: Diagnosis not present

## 2023-10-15 DIAGNOSIS — N281 Cyst of kidney, acquired: Secondary | ICD-10-CM | POA: Diagnosis not present

## 2023-10-15 DIAGNOSIS — Z8616 Personal history of COVID-19: Secondary | ICD-10-CM | POA: Diagnosis not present

## 2023-10-15 DIAGNOSIS — I10 Essential (primary) hypertension: Secondary | ICD-10-CM | POA: Diagnosis not present

## 2023-10-15 DIAGNOSIS — Z79899 Other long term (current) drug therapy: Secondary | ICD-10-CM | POA: Insufficient documentation

## 2023-10-15 DIAGNOSIS — E1165 Type 2 diabetes mellitus with hyperglycemia: Secondary | ICD-10-CM | POA: Insufficient documentation

## 2023-10-15 DIAGNOSIS — N3000 Acute cystitis without hematuria: Secondary | ICD-10-CM | POA: Diagnosis not present

## 2023-10-15 DIAGNOSIS — K760 Fatty (change of) liver, not elsewhere classified: Secondary | ICD-10-CM | POA: Diagnosis not present

## 2023-10-15 DIAGNOSIS — R1032 Left lower quadrant pain: Secondary | ICD-10-CM | POA: Diagnosis present

## 2023-10-15 DIAGNOSIS — N200 Calculus of kidney: Secondary | ICD-10-CM | POA: Diagnosis not present

## 2023-10-15 DIAGNOSIS — R739 Hyperglycemia, unspecified: Secondary | ICD-10-CM

## 2023-10-15 LAB — CBC WITH DIFFERENTIAL/PLATELET
Abs Immature Granulocytes: 0.04 K/uL (ref 0.00–0.07)
Basophils Absolute: 0.1 K/uL (ref 0.0–0.1)
Basophils Relative: 1 %
Eosinophils Absolute: 0.1 K/uL (ref 0.0–0.5)
Eosinophils Relative: 1 %
HCT: 41.6 % (ref 39.0–52.0)
Hemoglobin: 13 g/dL (ref 13.0–17.0)
Immature Granulocytes: 1 %
Lymphocytes Relative: 29 %
Lymphs Abs: 2.3 K/uL (ref 0.7–4.0)
MCH: 27.3 pg (ref 26.0–34.0)
MCHC: 31.3 g/dL (ref 30.0–36.0)
MCV: 87.2 fL (ref 80.0–100.0)
Monocytes Absolute: 0.6 K/uL (ref 0.1–1.0)
Monocytes Relative: 8 %
Neutro Abs: 5.1 K/uL (ref 1.7–7.7)
Neutrophils Relative %: 60 %
Platelets: 245 K/uL (ref 150–400)
RBC: 4.77 MIL/uL (ref 4.22–5.81)
RDW: 13.4 % (ref 11.5–15.5)
WBC: 8.2 K/uL (ref 4.0–10.5)
nRBC: 0 % (ref 0.0–0.2)

## 2023-10-15 LAB — COMPREHENSIVE METABOLIC PANEL WITH GFR
ALT: 19 U/L (ref 0–44)
AST: 21 U/L (ref 15–41)
Albumin: 4.6 g/dL (ref 3.5–5.0)
Alkaline Phosphatase: 89 U/L (ref 38–126)
Anion gap: 14 (ref 5–15)
BUN: 20 mg/dL (ref 6–20)
CO2: 26 mmol/L (ref 22–32)
Calcium: 9.9 mg/dL (ref 8.9–10.3)
Chloride: 96 mmol/L — ABNORMAL LOW (ref 98–111)
Creatinine, Ser: 1.39 mg/dL — ABNORMAL HIGH (ref 0.61–1.24)
GFR, Estimated: >60 mL/min (ref >60)
Glucose, Bld: 327 mg/dL — ABNORMAL HIGH (ref 70–99)
Potassium: 4.2 mmol/L (ref 3.5–5.1)
Sodium: 136 mmol/L (ref 135–145)
Total Bilirubin: 0.3 mg/dL (ref 0.0–1.2)
Total Protein: 8.3 g/dL — ABNORMAL HIGH (ref 6.5–8.1)

## 2023-10-15 LAB — URINALYSIS, ROUTINE W REFLEX MICROSCOPIC
Bilirubin Urine: NEGATIVE
Glucose, UA: >=500 mg/dL — AB
Ketones, ur: NEGATIVE mg/dL
Leukocytes,Ua: NEGATIVE
Nitrite: NEGATIVE
Protein, ur: 100 mg/dL — AB
Specific Gravity, Urine: 1.027 (ref 1.005–1.030)
pH: 5 (ref 5.0–8.0)

## 2023-10-15 MED ORDER — LACTATED RINGERS IV BOLUS
1000.0000 mL | Freq: Once | INTRAVENOUS | Status: AC
  Administered 2023-10-16: 1000 mL via INTRAVENOUS

## 2023-10-15 MED ORDER — LABETALOL HCL 5 MG/ML IV SOLN
10.0000 mg | Freq: Once | INTRAVENOUS | Status: AC
  Administered 2023-10-15: 10 mg via INTRAVENOUS
  Filled 2023-10-15: qty 4

## 2023-10-15 MED ORDER — DIPHENHYDRAMINE HCL 50 MG/ML IJ SOLN
25.0000 mg | Freq: Once | INTRAMUSCULAR | Status: AC
  Administered 2023-10-16: 25 mg via INTRAVENOUS
  Filled 2023-10-15: qty 1

## 2023-10-15 MED ORDER — IOHEXOL 350 MG/ML SOLN
100.0000 mL | Freq: Once | INTRAVENOUS | Status: AC | PRN
  Administered 2023-10-15: 100 mL via INTRAVENOUS

## 2023-10-15 NOTE — ED Provider Notes (Signed)
 Care of patient received from prior provider at 11:32 PM, please see their note for complete H/P and care plan.  Received handoff per ED course.  Clinical Course as of 10/15/23 2332  Wed Oct 15, 2023  2331 Stable 77 YOM with a chief complaint of urinary sxs. Multiple medical problems and noncompliant.  Also abdominal pain/SOB after long drive Cts pending.  [CC]    Clinical Course User Index [CC] Jason Meth, MD    Reassessment: CT scans negative for acute pathology.  Does show possible urothelial thickening.  He is grossly hyperglycemic and describing urinary symptoms with bacteriuria.  Possible infection.  Will treat with Keflex  pending urine culture results. Likely his symptoms are more so from hyperglycemia.  He has been completely nonadherent to all medications because he did not like how the Ozempic  made him feel. He has the Lantus  at home.  Recommended that he restart his insulin  at 75% his prior prescription dose and continue monitoring his blood sugar until he is seen by his PCP where they can discuss changing him to a different agent if desired.     Jason Meth, MD 10/16/23 [phone]

## 2023-10-15 NOTE — ED Triage Notes (Signed)
 Patient complains of frequent urination that he can not hold it and is even having spells of incontinence. Patient states it hits him and he can't make it to bathroom, he is waking up in middle in the night to urinate. Patient does complain of left flank pain- dull ache 4/10. Patient does have a history of kidney stones.

## 2023-10-15 NOTE — ED Triage Notes (Signed)
 Patient states he does not take his blood pressure meds. He is prescribed them.

## 2023-10-15 NOTE — ED Provider Notes (Signed)
 [facility] EMERGENCY DEPARTMENT AT [facility] Provider Note   CSN: [id number] Arrival date & time: 10/15/23  2006     Patient presents with: No chief complaint on file.   Jason Bruce is a 48 y.o. male.  {Add pertinent medical, surgical, social history, OB history to HPI:32947} HPI     48 year old male with a history of diabetes, hypertension, nephrolithiasis, diverticulitis  Drove back from [place] to [place], had to urinate right away after drinking water the whole way.  Has had hx of kidney stone/stents. 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 Now pain lower left abdomen started today.  Comes and goes, 4/10. Now when he urinates it is a lot.   Not having any difficulty urinating, no dysuria, not have trouble starting stream.  Does feel urgency then going.    Has been off of medications for sugar for a few months Off of the blood pressure medicines for a few months too   Past Medical History:  Diagnosis Date   Acute hypoxemic respiratory failure due to COVID-19 (HCC) 09/30/2019   AKI (acute kidney injury) (HCC)    Diabetes mellitus without complication (HCC)    Hypertension    Nephrolithiasis    Pneumonia due to COVID-19 virus 09/30/2019   Sigmoid diverticulitis 09/04/2021     Prior to Admission medications   Medication Sig Start Date End Date Taking? Authorizing Provider  Accu-Chek FastClix Lancets MISC Use to test blood sugar twice a day 09/20/20   Brien Belvie BRAVO, MD  amLODipine  (NORVASC ) 10 MG tablet Take 1 tablet (10 mg total) by mouth daily. 04/21/23   Celestia Rosaline SQUIBB, NP  atorvastatin  (LIPITOR) 80 MG tablet Take 1 tablet (80 mg total) by mouth daily. 12/24/22   Newlin, Enobong, MD  Blood Glucose Monitoring Suppl (ACCU-CHEK GUIDE) w/Device KIT Test blood sugar twice a day 10/27/19   Brien Belvie BRAVO, MD  glucose blood (ACCU-CHEK GUIDE) test strip Use as instructed 07/05/21   Brien Belvie BRAVO, MD  Insulin  Pen Needle (BD  ULTRA-FINE PEN NEEDLES) 29G X 12.7MM MISC Use with Lantus  Solostar pen nightly 07/05/21   Brien Belvie BRAVO, MD  Lancets Misc. (ACCU-CHEK FASTCLIX LANCET) KIT Use to test blood sugar twice a day 07/05/21   Brien Belvie BRAVO, MD  metFORMIN  (GLUCOPHAGE -XR) 500 MG 24 hr tablet Take 1 tablet (500 mg total) by mouth 2 (two) times daily with a meal. 12/24/22   Delbert Clam, MD  ondansetron  (ZOFRAN ) 4 MG tablet Take 1 tablet (4 mg total) by mouth every 8 (eight) hours as needed for nausea or vomiting. Patient not taking: Reported on 04/21/2023 07/12/22   Blue, Soijett A, PA-C  oxyCODONE -acetaminophen  (PERCOCET/ROXICET) 5-325 MG tablet Take 1 tablet by mouth every 6 (six) hours as needed for severe pain. Patient not taking: Reported on 04/21/2023 07/12/22   Blue, Soijett A, PA-C  Semaglutide ,0.25 or 0.5MG /DOS, (OZEMPIC , 0.25 OR 0.5 MG/DOSE,) 2 MG/3ML SOPN Inject 0.25 mg into the skin once a week. For 4 weeks. Then, increase to 0.5 mg subcutaneously once weekly for 4 weeks. 12/24/22   Newlin, Enobong, MD  tamsulosin  (FLOMAX ) 0.4 MG CAPS capsule Take 1 capsule (0.4 mg total) by mouth daily. 07/12/22   Blue, Soijett A, PA-C  valsartan  (DIOVAN ) 160 MG tablet Take 1 tablet (160 mg total) by mouth daily. 05/06/23   Celestia Rosaline SQUIBB, NP    Allergies: Shellfish allergy    Review of Systems  Updated Vital Signs BP (!) 220/133  Pulse 97   Temp 98.6 F (37 C) (Oral)   Resp 18   SpO2 98%   Physical Exam  (all labs ordered are listed, but only abnormal results are displayed) Labs Reviewed  URINALYSIS, ROUTINE W REFLEX MICROSCOPIC    EKG: None  Radiology: No results found.  {Document cardiac monitor, telemetry assessment procedure when appropriate:32947} Procedures   Medications Ordered in the ED - No data to display    {Click here for ABCD2, HEART and other calculators REFRESH Note before signing:1}                              Medical Decision Making Amount and/or Complexity of Data  Reviewed Labs: ordered.  Risk Prescription drug management.   ***  {Document critical care time when appropriate  Document review of labs and clinical decision tools ie CHADS2VASC2, etc  Document your independent review of radiology images and any outside records  Document your discussion with family members, caretakers and with consultants  Document social determinants of health affecting pt's care  Document your decision making why or why not admission, treatments were needed:32947:::1}   Final diagnoses:  None    ED Discharge Orders     None

## 2023-10-16 ENCOUNTER — Emergency Department (HOSPITAL_COMMUNITY)

## 2023-10-16 DIAGNOSIS — N281 Cyst of kidney, acquired: Secondary | ICD-10-CM | POA: Diagnosis not present

## 2023-10-16 DIAGNOSIS — N200 Calculus of kidney: Secondary | ICD-10-CM | POA: Diagnosis not present

## 2023-10-16 DIAGNOSIS — R918 Other nonspecific abnormal finding of lung field: Secondary | ICD-10-CM | POA: Diagnosis not present

## 2023-10-16 DIAGNOSIS — K760 Fatty (change of) liver, not elsewhere classified: Secondary | ICD-10-CM | POA: Diagnosis not present

## 2023-10-16 MED ORDER — CEPHALEXIN 500 MG PO CAPS
500.0000 mg | ORAL_CAPSULE | Freq: Three times a day (TID) | ORAL | 0 refills | Status: AC
Start: 2023-10-16 — End: 2023-10-21

## 2023-10-16 MED ORDER — CEPHALEXIN 500 MG PO CAPS
500.0000 mg | ORAL_CAPSULE | Freq: Once | ORAL | Status: AC
  Administered 2023-10-16: 500 mg via ORAL
  Filled 2023-10-16: qty 1

## 2023-10-16 MED ORDER — INSULIN GLARGINE 100 UNIT/ML ~~LOC~~ SOLN
20.0000 [IU] | Freq: Once | SUBCUTANEOUS | Status: AC
  Administered 2023-10-16: 20 [IU] via SUBCUTANEOUS
  Filled 2023-10-16: qty 0.2

## 2023-10-17 LAB — URINE CULTURE

## 2023-10-24 DIAGNOSIS — Z419 Encounter for procedure for purposes other than remedying health state, unspecified: Secondary | ICD-10-CM | POA: Diagnosis not present

## 2023-11-17 ENCOUNTER — Encounter (HOSPITAL_COMMUNITY): Payer: Self-pay

## 2023-11-17 ENCOUNTER — Emergency Department (HOSPITAL_COMMUNITY)
Admission: EM | Admit: 2023-11-17 | Discharge: 2023-11-17 | Disposition: A | Discharge status: Home / Self Care | Attending: Emergency Medicine | Admitting: Emergency Medicine

## 2023-11-17 ENCOUNTER — Emergency Department (HOSPITAL_COMMUNITY)

## 2023-11-17 ENCOUNTER — Other Ambulatory Visit: Payer: Self-pay

## 2023-11-17 DIAGNOSIS — N200 Calculus of kidney: Secondary | ICD-10-CM

## 2023-11-17 DIAGNOSIS — E119 Type 2 diabetes mellitus without complications: Secondary | ICD-10-CM | POA: Diagnosis not present

## 2023-11-17 DIAGNOSIS — R160 Hepatomegaly, not elsewhere classified: Secondary | ICD-10-CM | POA: Diagnosis not present

## 2023-11-17 DIAGNOSIS — Z7984 Long term (current) use of oral hypoglycemic drugs: Secondary | ICD-10-CM | POA: Insufficient documentation

## 2023-11-17 DIAGNOSIS — Z8616 Personal history of COVID-19: Secondary | ICD-10-CM | POA: Diagnosis not present

## 2023-11-17 DIAGNOSIS — N132 Hydronephrosis with renal and ureteral calculous obstruction: Secondary | ICD-10-CM | POA: Diagnosis not present

## 2023-11-17 DIAGNOSIS — N3080 Other cystitis without hematuria: Secondary | ICD-10-CM | POA: Diagnosis not present

## 2023-11-17 DIAGNOSIS — Z794 Long term (current) use of insulin: Secondary | ICD-10-CM | POA: Insufficient documentation

## 2023-11-17 DIAGNOSIS — Z79899 Other long term (current) drug therapy: Secondary | ICD-10-CM | POA: Diagnosis not present

## 2023-11-17 DIAGNOSIS — R109 Unspecified abdominal pain: Secondary | ICD-10-CM | POA: Diagnosis present

## 2023-11-17 DIAGNOSIS — R1031 Right lower quadrant pain: Secondary | ICD-10-CM | POA: Diagnosis not present

## 2023-11-17 DIAGNOSIS — N1330 Unspecified hydronephrosis: Secondary | ICD-10-CM | POA: Diagnosis not present

## 2023-11-17 DIAGNOSIS — I10 Essential (primary) hypertension: Secondary | ICD-10-CM | POA: Diagnosis not present

## 2023-11-17 DIAGNOSIS — N3289 Other specified disorders of bladder: Secondary | ICD-10-CM | POA: Diagnosis not present

## 2023-11-17 DIAGNOSIS — N179 Acute kidney failure, unspecified: Secondary | ICD-10-CM | POA: Diagnosis not present

## 2023-11-17 LAB — COMPREHENSIVE METABOLIC PANEL WITH GFR
ALT: 19 U/L (ref 0–44)
AST: 20 U/L (ref 15–41)
Albumin: 4.6 g/dL (ref 3.5–5.0)
Alkaline Phosphatase: 79 U/L (ref 38–126)
Anion gap: 14 (ref 5–15)
BUN: 23 mg/dL — ABNORMAL HIGH (ref 6–20)
CO2: 26 mmol/L (ref 22–32)
Calcium: 10.2 mg/dL (ref 8.9–10.3)
Chloride: 96 mmol/L — ABNORMAL LOW (ref 98–111)
Creatinine, Ser: 1.76 mg/dL — ABNORMAL HIGH (ref 0.61–1.24)
GFR, Estimated: 47 mL/min — ABNORMAL LOW (ref >60)
Glucose, Bld: 397 mg/dL — ABNORMAL HIGH (ref 70–99)
Potassium: 4.1 mmol/L (ref 3.5–5.1)
Sodium: 135 mmol/L (ref 135–145)
Total Bilirubin: 0.3 mg/dL (ref 0.0–1.2)
Total Protein: 8.5 g/dL — ABNORMAL HIGH (ref 6.5–8.1)

## 2023-11-17 LAB — CBC
HCT: 42.4 % (ref 39.0–52.0)
Hemoglobin: 13.4 g/dL (ref 13.0–17.0)
MCH: 27.9 pg (ref 26.0–34.0)
MCHC: 31.6 g/dL (ref 30.0–36.0)
MCV: 88.1 fL (ref 80.0–100.0)
Platelets: 259 K/uL (ref 150–400)
RBC: 4.81 MIL/uL (ref 4.22–5.81)
RDW: 13.4 % (ref 11.5–15.5)
WBC: 11.2 K/uL — ABNORMAL HIGH (ref 4.0–10.5)
nRBC: 0 % (ref 0.0–0.2)

## 2023-11-17 LAB — URINALYSIS, ROUTINE W REFLEX MICROSCOPIC
Bacteria, UA: NONE SEEN
Bilirubin Urine: NEGATIVE
Glucose, UA: >=500 mg/dL — AB
Hgb urine dipstick: NEGATIVE
Ketones, ur: 5 mg/dL — AB
Leukocytes,Ua: NEGATIVE
Nitrite: NEGATIVE
Protein, ur: 30 mg/dL — AB
Specific Gravity, Urine: 1.011 (ref 1.005–1.030)
pH: 7 (ref 5.0–8.0)

## 2023-11-17 LAB — LIPASE, BLOOD: Lipase: 54 U/L — ABNORMAL HIGH (ref 11–51)

## 2023-11-17 MED ORDER — OXYCODONE-ACETAMINOPHEN 5-325 MG PO TABS: 1.0000 | ORAL_TABLET | Freq: Four times a day (QID) | ORAL | 0 refills | Status: DC | PRN

## 2023-11-17 MED ORDER — IRBESARTAN 150 MG PO TABS
150.0000 mg | ORAL_TABLET | Freq: Every day | ORAL | Status: DC
  Administered 2023-11-17: 150 mg via ORAL
  Filled 2023-11-17 (×2): qty 1

## 2023-11-17 MED ORDER — IOHEXOL 300 MG/ML  SOLN
100.0000 mL | Freq: Once | INTRAMUSCULAR | Status: AC | PRN
  Administered 2023-11-17: 100 mL via INTRAVENOUS

## 2023-11-17 MED ORDER — SODIUM CHLORIDE 0.9 % IV BOLUS
1000.0000 mL | Freq: Once | INTRAVENOUS | Status: AC
  Administered 2023-11-17: 1000 mL via INTRAVENOUS

## 2023-11-17 MED ORDER — ONDANSETRON 4 MG PO TBDP: 4.0000 mg | ORAL_TABLET | Freq: Three times a day (TID) | ORAL | 0 refills | Status: DC | PRN

## 2023-11-17 MED ORDER — OXYCODONE-ACETAMINOPHEN 5-325 MG PO TABS
1.0000 | ORAL_TABLET | Freq: Once | ORAL | Status: AC
  Administered 2023-11-17: 1 via ORAL
  Filled 2023-11-17: qty 1

## 2023-11-17 MED ORDER — HYDRALAZINE HCL 20 MG/ML IJ SOLN
10.0000 mg | Freq: Once | INTRAMUSCULAR | Status: AC
  Administered 2023-11-17: 10 mg via INTRAVENOUS
  Filled 2023-11-17: qty 1

## 2023-11-17 MED ORDER — ONDANSETRON HCL 4 MG/2ML IJ SOLN
4.0000 mg | Freq: Once | INTRAMUSCULAR | Status: AC
  Administered 2023-11-17: 4 mg via INTRAVENOUS
  Filled 2023-11-17: qty 2

## 2023-11-17 MED ORDER — AMLODIPINE BESYLATE 5 MG PO TABS
10.0000 mg | ORAL_TABLET | Freq: Once | ORAL | Status: AC
  Administered 2023-11-17: 10 mg via ORAL
  Filled 2023-11-17: qty 2

## 2023-11-17 MED ORDER — HYDROMORPHONE HCL 1 MG/ML IJ SOLN
1.0000 mg | Freq: Once | INTRAMUSCULAR | Status: AC
  Administered 2023-11-17: 1 mg via INTRAVENOUS
  Filled 2023-11-17: qty 1

## 2023-11-17 MED ORDER — FENTANYL CITRATE PF 50 MCG/ML IJ SOSY
50.0000 ug | PREFILLED_SYRINGE | Freq: Once | INTRAMUSCULAR | Status: AC
  Administered 2023-11-17: 50 ug via INTRAVENOUS
  Filled 2023-11-17: qty 1

## 2023-11-17 NOTE — ED Notes (Signed)
 Given 300cc water for PO challenge. BP remains elevated. Pain improved, 3/10.

## 2023-11-17 NOTE — Consult Note (Signed)
 I have been asked to see the patient by Dr. Dorn Ahle, for evaluation and management of right ureteral stone.  History of present illness: 48 year old male with history of nephrolithiasis requiring 2 stents in the past is generally seen at [facility].  Presented after having pain this morning woke up went to work and started having nausea and right flank pain.  Denies fevers or chills denies gross hematuria.  Patient states he has not eaten all day or drink.  CT shows 3 mm right ureteral stone with right-sided hydronephrosis and perinephric stranding.  Labs are nonconcerning for infection WBC 11.3, creatinine 1.79 with GFR 47.  UA nonconcerning for infection.   Review of systems: A 12 point comprehensive review of systems was obtained and is negative unless otherwise stated in the history of present illness.  Patient Active Problem List   Diagnosis Date Noted   Hyperlipidemia 01/30/2022   Diverticulitis 01/02/2022   Staphylococcus epidermidis infection 01/02/2022   Hospital discharge follow-up 01/02/2022   Administrative encounter 11/15/2021   OSA (obstructive sleep apnea) 07/05/2021   Periodontal disease due to type 2 diabetes mellitus (HCC) 07/05/2021   Diverticulitis of colon with perforation 05/29/2021   History of kidney stones 05/29/2021   Controlled type 2 diabetes mellitus with microalbuminuria, without long-term current use of insulin  (HCC) 05/29/2021   BMI 45.0-49.9, adult (HCC) 06/13/2020   Colon polyps 04/24/2020   Hypertension    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   Current Outpatient Medications on File Prior to Encounter  Medication Sig Dispense Refill   Accu-Chek FastClix Lancets MISC Use to test blood sugar twice a day 100 each 1   amLODipine  (NORVASC ) 10 MG tablet Take 1 tablet (10 mg total) by mouth daily. 90 tablet 1   atorvastatin  (LIPITOR) 80 MG tablet Take 1 tablet (80 mg total) by mouth  daily. 90 tablet 1   Blood Glucose Monitoring Suppl (ACCU-CHEK GUIDE) w/Device KIT Test blood sugar twice a day 1 kit 0   glucose blood (ACCU-CHEK GUIDE) test strip Use as instructed 100 each 12   Insulin  Pen Needle (BD ULTRA-FINE PEN NEEDLES) 29G X 12.7MM MISC Use with Lantus  Solostar pen nightly 100 each 2   Lancets Misc. (ACCU-CHEK FASTCLIX LANCET) KIT Use to test blood sugar twice a day 1 kit 0   metFORMIN  (GLUCOPHAGE -XR) 500 MG 24 hr tablet Take 1 tablet (500 mg total) by mouth 2 (two) times daily with a meal. 180 tablet 1   ondansetron  (ZOFRAN ) 4 MG tablet Take 1 tablet (4 mg total) by mouth every 8 (eight) hours as needed for nausea or vomiting. (Patient not taking: Reported on 04/21/2023) 12 tablet 0   oxyCODONE -acetaminophen  (PERCOCET/ROXICET) 5-325 MG tablet Take 1 tablet by mouth every 6 (six) hours as needed for severe pain. (Patient not taking: Reported on 04/21/2023) 9 tablet 0   Semaglutide ,0.25 or 0.5MG /DOS, (OZEMPIC , 0.25 OR 0.5 MG/DOSE,) 2 MG/3ML SOPN Inject 0.25 mg into the skin once a week. For 4 weeks. Then, increase to 0.5 mg subcutaneously once weekly for 4 weeks. 3 mL 1   tamsulosin  (FLOMAX ) 0.4 MG CAPS capsule Take 1 capsule (0.4 mg total) by mouth daily. 30 capsule 0   valsartan  (DIOVAN ) 160 MG tablet Take 1 tablet (160 mg total) by mouth daily. 90 each 1    Past Medical History:  Diagnosis Date   Acute hypoxemic respiratory failure due to COVID-19 (HCC) 09/30/2019   AKI (acute kidney injury)  Diabetes mellitus without complication (HCC)    Hypertension    Nephrolithiasis    Pneumonia due to COVID-19 virus 09/30/2019   Sigmoid diverticulitis 09/04/2021    Past Surgical History:  Procedure Laterality Date   WISDOM TOOTH EXTRACTION      Social History   Tobacco Use   Smoking status: Never   Smokeless tobacco: Never  Vaping Use   Vaping status: Never Used  Substance Use Topics   Alcohol use: No   Drug use: No    Family History  Problem Relation Age of  Onset   Cancer Mother    Hypertension Other    Diabetes Other    Colon polyps Neg Hx    Colon cancer Neg Hx    Esophageal cancer Neg Hx    Rectal cancer Neg Hx    Stomach cancer Neg Hx     PE: Vitals:   11/17/23 1445 11/17/23 1500 11/17/23 1515 11/17/23 1535  BP: (!) 223/157   (!) 219/143  Pulse: 99 97 99 96  Resp:    19  Temp:    98.2 F (36.8 C)  TempSrc:    Oral  SpO2: 99% 98% 96% 96%  Weight:      Height:       Patient appears to be in no acute distress  patient is alert and oriented x3 Atraumatic normocephalic head No cervical or supraclavicular lymphadenopathy appreciated No increased work of breathing, no audible wheezes/rhonchi Regular sinus rhythm/rate Abdomen is soft, nontender, nondistended, no CVA or suprapubic tenderness Lower extremities are symmetric without appreciable edema Grossly neurologically intact No identifiable skin lesions  Recent Labs    11/17/23 1033  WBC 11.2*  HGB 13.4  HCT 42.4   Recent Labs    11/17/23 1033  NA 135  K 4.1  CL 96*  CO2 26  GLUCOSE 397*  BUN 23*  CREATININE 1.76*  CALCIUM  10.2   No results for input(s): LABPT, INR in the last 72 hours. No results for input(s): LABURIN in the last 72 hours. Results for orders placed or performed during the hospital encounter of 10/15/23  Urine Culture     Status: Abnormal   Collection Time: 10/15/23  8:45 PM   Specimen: Urine, Clean Catch  Result Value Ref Range Status   Specimen Description   Final    URINE, CLEAN CATCH Performed at [facility], [address], [place], [place] [address]    Special Requests   Final    NONE Performed at [facility], [address] [place], [place] [address]    Culture MULTIPLE SPECIES PRESENT, SUGGEST RECOLLECTION (A)  Final   Report Status 10/17/2023 FINAL  Final    Imaging: CT 11/17/2023 impression 1. 2-3 mm proximal right ureteral stone with mild right hydronephrosis and right  renal edema. 2. Minimal subpleural ground-glass in the lower lobes is of uncertain chronicity but likely infectious/inflammatory in etiology. If there is concern for interstitial lung abnormality, nonemergent high-resolution chest CT could be performed. 3. Steatotic enlarged liver.  Imp: 48 year old male with a right proximal ureteral stone 3 mm in size had long discussion with the patient about his high likelihood of being able to pass the stone.  We also discussed options of ureteroscopy.  Patient would like to try to pass the stone we will do a p.o. challenge in house repeat creatinine after fluid bolus.  Instructed patient to follow-up with his [facility] urologist  If patient is unable to tolerate p.o. challenge  will plan for ureteroscopy.  We discussed risk benefits alternatives to ureteroscopy.  This included bleeding infection and damage to surrounding structures surrounding structures including ureter as well as urethra.  We discussed the need for stent postoperatively as well as the potential symptoms of stent placement.  We discussed possible inability to complete procedure due to caliber of your ureter or inability to pass stone possibly requiring long-term stent versus nephrostomy tube.  We discussed need for possible second surgery.  Patient voiced their understanding and consent was obtained.  Recommendations: P.o. challenge Discharge on Flomax , sublingual Zofran , opiates, if creatinine improves can discharge on Toradol  as well. If you have discharged recommend calling urologist at [facility] for follow-up ASAP.   Thank you for involving me in this patient's care, I will continue to follow along.Please page with any further questions or concerns. Steffan JAYSON Pea

## 2023-11-17 NOTE — ED Provider Notes (Signed)
 [place] EMERGENCY DEPARTMENT AT [facility] Provider Note   CSN: [id number] Arrival date & time: 11/17/23  1025     Patient presents with: Abdominal Pain   Jason Bruce is a 48 y.o. male.  {Add pertinent medical, surgical, social history, OB history to YEP:67052} HPI Patient with history of prior nephrolithiasis, diverticulitis presents with right-sided abdominal pain, nausea, vomiting.  Onset was today.  Since onset he has been intolerant of medications for relief for his typical meds.  Pain is focally in the right side, nonradiating with associated nausea, vomiting, but no bowel movement changes.     Prior to Admission medications   Medication Sig Start Date End Date Taking? Authorizing Provider  Accu-Chek FastClix Lancets MISC Use to test blood sugar twice a day 09/20/20   Brien Belvie BRAVO, MD  amLODipine  (NORVASC ) 10 MG tablet Take 1 tablet (10 mg total) by mouth daily. 04/21/23   Celestia Rosaline SQUIBB, NP  atorvastatin  (LIPITOR) 80 MG tablet Take 1 tablet (80 mg total) by mouth daily. 12/24/22   Newlin, Enobong, MD  Blood Glucose Monitoring Suppl (ACCU-CHEK GUIDE) w/Device KIT Test blood sugar twice a day 10/27/19   Brien Belvie BRAVO, MD  glucose blood (ACCU-CHEK GUIDE) test strip Use as instructed 07/05/21   Brien Belvie BRAVO, MD  Insulin  Pen Needle (BD ULTRA-FINE PEN NEEDLES) 29G X 12.7MM MISC Use with Lantus  Solostar pen nightly 07/05/21   Brien Belvie BRAVO, MD  Lancets Misc. (ACCU-CHEK FASTCLIX LANCET) KIT Use to test blood sugar twice a day 07/05/21   Brien Belvie BRAVO, MD  metFORMIN  (GLUCOPHAGE -XR) 500 MG 24 hr tablet Take 1 tablet (500 mg total) by mouth 2 (two) times daily with a meal. 12/24/22   Delbert Clam, MD  ondansetron  (ZOFRAN ) 4 MG tablet Take 1 tablet (4 mg total) by mouth every 8 (eight) hours as needed for nausea or vomiting. Patient not taking: Reported on 04/21/2023 07/12/22   Blue, Soijett A, PA-C  oxyCODONE -acetaminophen  (PERCOCET/ROXICET) 5-325  MG tablet Take 1 tablet by mouth every 6 (six) hours as needed for severe pain. Patient not taking: Reported on 04/21/2023 07/12/22   Blue, Soijett A, PA-C  Semaglutide ,0.25 or 0.5MG /DOS, (OZEMPIC , 0.25 OR 0.5 MG/DOSE,) 2 MG/3ML SOPN Inject 0.25 mg into the skin once a week. For 4 weeks. Then, increase to 0.5 mg subcutaneously once weekly for 4 weeks. 12/24/22   Newlin, Enobong, MD  tamsulosin  (FLOMAX ) 0.4 MG CAPS capsule Take 1 capsule (0.4 mg total) by mouth daily. 07/12/22   Blue, Soijett A, PA-C  valsartan  (DIOVAN ) 160 MG tablet Take 1 tablet (160 mg total) by mouth daily. 05/06/23   Celestia Rosaline SQUIBB, NP    Allergies: Shellfish allergy    Review of Systems  Updated Vital Signs BP (!) 219/143 (BP Location: Left Wrist)   Pulse 96   Temp 98.2 F (36.8 C) (Oral)   Resp 19   Ht 1.753 m (5' 9)   Wt (!) 158.8 kg   SpO2 96%   BMI 51.69 kg/m   Physical Exam Vitals and nursing note reviewed.  Constitutional:      General: He is not in acute distress.    Appearance: He is well-developed. He is obese.  HENT:     Head: Normocephalic and atraumatic.  Eyes:     Conjunctiva/sclera: Conjunctivae normal.  Cardiovascular:     Rate and Rhythm: Normal rate and regular rhythm.  Pulmonary:     Effort: Pulmonary effort is normal. No respiratory distress.  Breath sounds: No stridor.  Abdominal:     General: There is no distension.     Tenderness: There is abdominal tenderness in the right lower quadrant and suprapubic area.  Skin:    General: Skin is warm and dry.  Neurological:     Mental Status: He is alert and oriented to person, place, and time.     (all labs ordered are listed, but only abnormal results are displayed) Labs Reviewed  LIPASE, BLOOD - Abnormal; Notable for the following components:      Result Value   Lipase 54 (*)    All other components within normal limits  COMPREHENSIVE METABOLIC PANEL WITH GFR - Abnormal; Notable for the following components:   Chloride 96  (*)    Glucose, Bld 397 (*)    BUN 23 (*)    Creatinine, Ser 1.76 (*)    Total Protein 8.5 (*)    GFR, Estimated 47 (*)    All other components within normal limits  CBC - Abnormal; Notable for the following components:   WBC 11.2 (*)    All other components within normal limits  URINALYSIS, ROUTINE W REFLEX MICROSCOPIC - Abnormal; Notable for the following components:   Color, Urine COLORLESS (*)    Glucose, UA >=500 (*)    Ketones, ur 5 (*)    Protein, ur 30 (*)    All other components within normal limits    EKG: None  Radiology: CT ABDOMEN PELVIS W CONTRAST Result Date: 11/17/2023 CLINICAL DATA:  Right lower quadrant pain, vomiting. EXAM: CT ABDOMEN AND PELVIS WITH CONTRAST TECHNIQUE: Multidetector CT imaging of the abdomen and pelvis was performed using the standard protocol following bolus administration of intravenous contrast. RADIATION DOSE REDUCTION: This exam was performed according to the departmental dose-optimization program which includes automated exposure control, adjustment of the mA and/or kV according to patient size and/or use of iterative reconstruction technique. CONTRAST:  OMNIPAQUE  IOHEXOL  300 MG/ML  SOLN COMPARISON:  10/15/2023. FINDINGS: Lower chest: Minimal subpleural ground-glass in the lower lobes. Heart is at the upper limits of normal in size. No pericardial or pleural effusion. Distal esophagus is grossly unremarkable. Hepatobiliary: Liver is decreased in attenuation diffusely and is enlarged, 20.8 cm. Liver and gallbladder are otherwise unremarkable. No biliary ductal dilatation. Pancreas: Negative. Spleen: Negative. Adrenals/Urinary Tract: Adrenal glands are unremarkable. Right renal edema with mild right hydronephrosis the level of a 2-3 mm stone in the proximal right ureter (2/56). Right ureter is otherwise decompressed. Kidneys are otherwise unremarkable. Left ureter is decompressed. Bladder is grossly unremarkable. Stomach/Bowel: Stomach, small  bowel, appendix and colon are unremarkable. Vascular/Lymphatic: Vascular structures are unremarkable. No pathologically enlarged lymph nodes. Reproductive: Prostate is normal in size. Other: No free fluid.  Mesenteries and peritoneum are unremarkable. Musculoskeletal: None. IMPRESSION: 1. 2-3 mm proximal right ureteral stone with mild right hydronephrosis and right renal edema. 2. Minimal subpleural ground-glass in the lower lobes is of uncertain chronicity but likely infectious/inflammatory in etiology. If there is concern for interstitial lung abnormality, nonemergent high-resolution chest CT could be performed. 3. Steatotic enlarged liver. Electronically Signed   By: Newell Eke M.D.   On: 11/17/2023 14:27    {Document cardiac monitor, telemetry assessment procedure when appropriate:32947} Procedures   Medications Ordered in the ED  HYDROmorphone  (DILAUDID ) injection 1 mg (has no administration in time range)  sodium chloride  0.9 % bolus 1,000 mL (0 mLs Intravenous Stopped 11/17/23 1251)  ondansetron  (ZOFRAN ) injection 4 mg (4 mg Intravenous Given 11/17/23 1200)  fentaNYL  (SUBLIMAZE ) injection 50 mcg (50 mcg Intravenous Given 11/17/23 1200)  iohexol  (OMNIPAQUE ) 300 MG/ML solution 100 mL (100 mLs Intravenous Contrast Given 11/17/23 1336)      {Click here for ABCD2, HEART and other calculators REFRESH Note before signing:1}                              Medical Decision Making Adult male, obese, history of nephrolithiasis, diverticulitis presents with lower abdominal pain, nausea, vomiting.  Broad differential including nephrolithiasis, diverticulitis, appendicitis, cholecystitis. Patient received analgesics, antiemetics, fluids, monitoring.  Patient initially hypertensive, 215 systolic, cardiac 95 sinus rhythm unremarkable pulse ox 97% room air normal  Amount and/or Complexity of Data Reviewed Independent Historian: spouse    Details: At bedside External Data Reviewed: notes. Labs: ordered.  Decision-making details documented in ED Course. Radiology: ordered and independent interpretation performed. Decision-making details documented in ED Course.  Risk Prescription drug management. Decision regarding hospitalization. Diagnosis or treatment significantly limited by social determinants of health.   Update: Patient continues to complain of pain.  3:53 PM Patient accompanied by his wife.  We discussed all findings including CT evidence for proximal ureter renal stone, as well as renal edema.  Labs consistent with prior, with ongoing renal dysfunction, worse since values of 1 month ago.  Patient with mild hyperglycemia as well.  Patient's blood pressure mains elevated, likely secondary to baseline hypertension exacerbated by ongoing pain.  Patient does have mild worsening of renal function, though this is likely secondary to kidney stone, less likely hypertensive crisis. I have discussed patient's case with our urology colleagues, and the patient will be seen in the emergency department by Dr. Shane.  {Document critical care time when appropriate  Document review of labs and clinical decision tools ie CHADS2VASC2, etc  Document your independent review of radiology images and any outside records  Document your discussion with family members, caretakers and with consultants  Document social determinants of health affecting pt's care  Document your decision making why or why not admission, treatments were needed:32947:::1}   Final diagnoses:  None    ED Discharge Orders     None

## 2023-11-17 NOTE — ED Notes (Signed)
 Pt updated, given urinal, pending CT results. Reports pain worsening. EDP notified.

## 2023-11-17 NOTE — ED Notes (Signed)
 EDP at [facility]

## 2023-11-17 NOTE — ED Triage Notes (Addendum)
 Patient has right sided middle abdominal pain since this morning. Has been vomiting. No diarrhea. No burning urination. Stated this has happened before and he it was a kidney stone 2 years ago or it could be his diverticulitis.

## 2023-11-17 NOTE — ED Notes (Addendum)
 Emptied urinal; 1600 ml. RN was notified of pt's BP and pain level.

## 2023-11-17 NOTE — ED Provider Notes (Signed)
 I received the patient in signout.  This is a 48 year old male presenting today with ureterolithiasis.  The patient is given antihypertensives because he was not able to take his oral antihypertensives and plans for reassessment of his blood pressure n.p.o. status for ultimate disposition. Physical Exam  BP (!) 219/143 (BP Location: Left Wrist)   Pulse 96   Temp 98.2 F (36.8 C) (Oral)   Resp 19   Ht 5' 9 (1.753 m)   Wt (!) 158.8 kg   SpO2 96%   BMI 51.69 kg/m   Physical Exam General: No acute distress Procedures  Procedures  ED Course / MDM    Medical Decision Making Amount and/or Complexity of Data Reviewed Labs: ordered. Radiology: ordered.  Risk Prescription drug management.   The patient was able to take his blood pressure medications and did not have any further vomiting.  When I went to reevaluate the patient his last blood pressure reading is 189 systolic.  I did discuss this with the patient and his wife.  The patient does apparently have blood pressure medications at home but is not adherent.  They report that his blood pressures in the morning are always over 200.  I think that with the systolic of 189 that it is reasonable to discharge the patient with outpatient follow-up.  He does not have any findings of endorgan dysfunction here.  He is encouraged to restart his blood pressure medications and to follow-up with his primary care provider to have this rechecked as an outpatient.  He is discharged.       Ula Prentice SAUNDERS, MD 11/17/23 2236

## 2023-11-17 NOTE — Discharge Instructions (Signed)
 It is very important that you follow-up with our urology colleagues via telephone tomorrow.  Return here for concerning changes. In addition to prescribed medications, ibuprofen , 400 mg, taken every 8 hours for a total of 3 doses over the next 24 hours will be beneficial for your pain.

## 2023-11-17 NOTE — ED Notes (Signed)
 Waiting for avapro from pharmacy

## 2023-12-31 DIAGNOSIS — Z125 Encounter for screening for malignant neoplasm of prostate: Secondary | ICD-10-CM | POA: Diagnosis not present

## 2023-12-31 DIAGNOSIS — N200 Calculus of kidney: Secondary | ICD-10-CM | POA: Diagnosis not present

## 2024-01-23 DIAGNOSIS — Z419 Encounter for procedure for purposes other than remedying health state, unspecified: Secondary | ICD-10-CM | POA: Diagnosis not present

## 2024-03-02 ENCOUNTER — Other Ambulatory Visit: Payer: Self-pay

## 2024-03-02 ENCOUNTER — Observation Stay (HOSPITAL_COMMUNITY)
Admission: EM | Admit: 2024-03-02 | Discharge: 2024-03-03 | Disposition: A | Attending: Internal Medicine | Admitting: Internal Medicine

## 2024-03-02 ENCOUNTER — Encounter (HOSPITAL_COMMUNITY): Payer: Self-pay | Admitting: Internal Medicine

## 2024-03-02 ENCOUNTER — Emergency Department (HOSPITAL_COMMUNITY)

## 2024-03-02 DIAGNOSIS — Z794 Long term (current) use of insulin: Secondary | ICD-10-CM | POA: Insufficient documentation

## 2024-03-02 DIAGNOSIS — E871 Hypo-osmolality and hyponatremia: Secondary | ICD-10-CM | POA: Diagnosis not present

## 2024-03-02 DIAGNOSIS — Z7985 Long-term (current) use of injectable non-insulin antidiabetic drugs: Secondary | ICD-10-CM | POA: Diagnosis not present

## 2024-03-02 DIAGNOSIS — Z7982 Long term (current) use of aspirin: Secondary | ICD-10-CM | POA: Diagnosis not present

## 2024-03-02 DIAGNOSIS — R7989 Other specified abnormal findings of blood chemistry: Secondary | ICD-10-CM | POA: Insufficient documentation

## 2024-03-02 DIAGNOSIS — Z6841 Body Mass Index (BMI) 40.0 and over, adult: Secondary | ICD-10-CM | POA: Insufficient documentation

## 2024-03-02 DIAGNOSIS — E1110 Type 2 diabetes mellitus with ketoacidosis without coma: Secondary | ICD-10-CM | POA: Diagnosis not present

## 2024-03-02 DIAGNOSIS — Z79899 Other long term (current) drug therapy: Secondary | ICD-10-CM | POA: Insufficient documentation

## 2024-03-02 DIAGNOSIS — N1831 Chronic kidney disease, stage 3a: Secondary | ICD-10-CM | POA: Insufficient documentation

## 2024-03-02 DIAGNOSIS — E1165 Type 2 diabetes mellitus with hyperglycemia: Secondary | ICD-10-CM | POA: Insufficient documentation

## 2024-03-02 DIAGNOSIS — I10 Essential (primary) hypertension: Secondary | ICD-10-CM | POA: Diagnosis present

## 2024-03-02 DIAGNOSIS — E1122 Type 2 diabetes mellitus with diabetic chronic kidney disease: Secondary | ICD-10-CM | POA: Insufficient documentation

## 2024-03-02 DIAGNOSIS — I129 Hypertensive chronic kidney disease with stage 1 through stage 4 chronic kidney disease, or unspecified chronic kidney disease: Secondary | ICD-10-CM | POA: Diagnosis not present

## 2024-03-02 DIAGNOSIS — R072 Precordial pain: Secondary | ICD-10-CM

## 2024-03-02 DIAGNOSIS — Z7984 Long term (current) use of oral hypoglycemic drugs: Secondary | ICD-10-CM | POA: Insufficient documentation

## 2024-03-02 DIAGNOSIS — Z8616 Personal history of COVID-19: Secondary | ICD-10-CM | POA: Insufficient documentation

## 2024-03-02 DIAGNOSIS — I160 Hypertensive urgency: Secondary | ICD-10-CM | POA: Insufficient documentation

## 2024-03-02 DIAGNOSIS — R748 Abnormal levels of other serum enzymes: Secondary | ICD-10-CM | POA: Insufficient documentation

## 2024-03-02 LAB — BASIC METABOLIC PANEL WITH GFR
Anion gap: 18 — ABNORMAL HIGH (ref 5–15)
Anion gap: 16 — ABNORMAL HIGH (ref 5–15)
Anion gap: 12 (ref 5–15)
Anion gap: 10 (ref 5–15)
BUN: 20 mg/dL (ref 6–20)
BUN: 22 mg/dL — ABNORMAL HIGH (ref 6–20)
BUN: 19 mg/dL (ref 6–20)
BUN: 17 mg/dL (ref 6–20)
CO2: 21 mmol/L — ABNORMAL LOW (ref 22–32)
CO2: 22 mmol/L (ref 22–32)
CO2: 26 mmol/L (ref 22–32)
CO2: 26 mmol/L (ref 22–32)
Calcium: 9.5 mg/dL (ref 8.9–10.3)
Calcium: 9.7 mg/dL (ref 8.9–10.3)
Calcium: 9.9 mg/dL (ref 8.9–10.3)
Calcium: 9.3 mg/dL (ref 8.9–10.3)
Chloride: 94 mmol/L — ABNORMAL LOW (ref 98–111)
Chloride: 97 mmol/L — ABNORMAL LOW (ref 98–111)
Chloride: 99 mmol/L (ref 98–111)
Chloride: 100 mmol/L (ref 98–111)
Creatinine, Ser: 1.59 mg/dL — ABNORMAL HIGH (ref 0.61–1.24)
Creatinine, Ser: 1.45 mg/dL — ABNORMAL HIGH (ref 0.61–1.24)
Creatinine, Ser: 1.27 mg/dL — ABNORMAL HIGH (ref 0.61–1.24)
Creatinine, Ser: 1.2 mg/dL (ref 0.61–1.24)
GFR, Estimated: 53 mL/min — ABNORMAL LOW
GFR, Estimated: 59 mL/min — ABNORMAL LOW
GFR, Estimated: >60 mL/min
GFR, Estimated: >60 mL/min
Glucose, Bld: 492 mg/dL — ABNORMAL HIGH (ref 70–99)
Glucose, Bld: 449 mg/dL — ABNORMAL HIGH (ref 70–99)
Glucose, Bld: 220 mg/dL — ABNORMAL HIGH (ref 70–99)
Glucose, Bld: 187 mg/dL — ABNORMAL HIGH (ref 70–99)
Potassium: 4.2 mmol/L (ref 3.5–5.1)
Potassium: 4.3 mmol/L (ref 3.5–5.1)
Potassium: 3.7 mmol/L (ref 3.5–5.1)
Potassium: 3.6 mmol/L (ref 3.5–5.1)
Sodium: 133 mmol/L — ABNORMAL LOW (ref 135–145)
Sodium: 134 mmol/L — ABNORMAL LOW (ref 135–145)
Sodium: 137 mmol/L (ref 135–145)
Sodium: 136 mmol/L (ref 135–145)

## 2024-03-02 LAB — CBC
HCT: 39.8 % (ref 39.0–52.0)
Hemoglobin: 13.1 g/dL (ref 13.0–17.0)
MCH: 28.4 pg (ref 26.0–34.0)
MCHC: 32.9 g/dL (ref 30.0–36.0)
MCV: 86.1 fL (ref 80.0–100.0)
Platelets: 280 K/uL (ref 150–400)
RBC: 4.62 MIL/uL (ref 4.22–5.81)
RDW: 13.4 % (ref 11.5–15.5)
WBC: 8.5 K/uL (ref 4.0–10.5)
nRBC: 0 % (ref 0.0–0.2)

## 2024-03-02 LAB — URINALYSIS, ROUTINE W REFLEX MICROSCOPIC
Bacteria, UA: NONE SEEN
Bilirubin Urine: NEGATIVE
Glucose, UA: >=500 mg/dL — AB
Hgb urine dipstick: NEGATIVE
Ketones, ur: NEGATIVE mg/dL
Leukocytes,Ua: NEGATIVE
Nitrite: NEGATIVE
Protein, ur: 30 mg/dL — AB
Specific Gravity, Urine: 1.025 (ref 1.005–1.030)
pH: 5 (ref 5.0–8.0)

## 2024-03-02 LAB — GLUCOSE, CAPILLARY
Glucose-Capillary: 154 mg/dL — ABNORMAL HIGH (ref 70–99)
Glucose-Capillary: 165 mg/dL — ABNORMAL HIGH (ref 70–99)
Glucose-Capillary: 168 mg/dL — ABNORMAL HIGH (ref 70–99)

## 2024-03-02 LAB — BETA-HYDROXYBUTYRIC ACID
Beta-Hydroxybutyric Acid: 0.15 mmol/L (ref 0.05–0.27)
Beta-Hydroxybutyric Acid: 0.07 mmol/L (ref 0.05–0.27)
Beta-Hydroxybutyric Acid: 0.07 mmol/L (ref 0.05–0.27)

## 2024-03-02 LAB — HEPATIC FUNCTION PANEL
ALT: 15 U/L (ref 0–44)
AST: 27 U/L (ref 15–41)
Albumin: 4.1 g/dL (ref 3.5–5.0)
Alkaline Phosphatase: 86 U/L (ref 38–126)
Bilirubin, Direct: <0.1 mg/dL (ref 0.0–0.2)
Total Bilirubin: 0.4 mg/dL (ref 0.0–1.2)
Total Protein: 7.4 g/dL (ref 6.5–8.1)

## 2024-03-02 LAB — HEMOGLOBIN A1C
Hgb A1c MFr Bld: 12.6 % — ABNORMAL HIGH (ref 4.8–5.6)
Mean Plasma Glucose: 314.92 mg/dL

## 2024-03-02 LAB — BLOOD GAS, VENOUS
Acid-Base Excess: 4.8 mmol/L — ABNORMAL HIGH (ref 0.0–2.0)
Bicarbonate: 29.9 mmol/L — ABNORMAL HIGH (ref 20.0–28.0)
O2 Saturation: 85.2 %
Patient temperature: 37
pCO2, Ven: 45 mmHg (ref 44–60)
pH, Ven: 7.43 (ref 7.25–7.43)
pO2, Ven: 51 mmHg — ABNORMAL HIGH (ref 32–45)

## 2024-03-02 LAB — PRO BRAIN NATRIURETIC PEPTIDE: Pro Brain Natriuretic Peptide: 224 pg/mL

## 2024-03-02 LAB — MRSA NEXT GEN BY PCR, NASAL: MRSA by PCR Next Gen: DETECTED — AB

## 2024-03-02 LAB — TROPONIN T, HIGH SENSITIVITY
Troponin T High Sensitivity: 55 ng/L — ABNORMAL HIGH (ref 0–19)
Troponin T High Sensitivity: 54 ng/L — ABNORMAL HIGH (ref 0–19)

## 2024-03-02 LAB — CBG MONITORING, ED
Glucose-Capillary: 399 mg/dL — ABNORMAL HIGH (ref 70–99)
Glucose-Capillary: 341 mg/dL — ABNORMAL HIGH (ref 70–99)
Glucose-Capillary: 254 mg/dL — ABNORMAL HIGH (ref 70–99)
Glucose-Capillary: 224 mg/dL — ABNORMAL HIGH (ref 70–99)
Glucose-Capillary: 208 mg/dL — ABNORMAL HIGH (ref 70–99)

## 2024-03-02 LAB — PROTIME-INR
INR: 0.9 (ref 0.8–1.2)
Prothrombin Time: 12.6 s (ref 11.4–15.2)

## 2024-03-02 LAB — LIPASE, BLOOD: Lipase: 154 U/L — ABNORMAL HIGH (ref 11–51)

## 2024-03-02 MED ORDER — LABETALOL HCL 5 MG/ML IV SOLN
10.0000 mg | INTRAVENOUS | Status: DC | PRN
  Administered 2024-03-02 (×3): 10 mg via INTRAVENOUS
  Filled 2024-03-02 (×3): qty 4

## 2024-03-02 MED ORDER — ONDANSETRON HCL 4 MG/2ML IJ SOLN: 4.0000 mg | Freq: Four times a day (QID) | INTRAMUSCULAR | Status: DC | PRN

## 2024-03-02 MED ORDER — ALUM & MAG HYDROXIDE-SIMETH 200-200-20 MG/5ML PO SUSP
30.0000 mL | Freq: Once | ORAL | Status: AC
  Administered 2024-03-02: 30 mL via ORAL
  Filled 2024-03-02: qty 30

## 2024-03-02 MED ORDER — POTASSIUM CHLORIDE 10 MEQ/100ML IV SOLN
10.0000 meq | INTRAVENOUS | Status: AC
  Administered 2024-03-02 (×2): 10 meq via INTRAVENOUS
  Filled 2024-03-02 (×2): qty 100

## 2024-03-02 MED ORDER — OXYCODONE HCL 5 MG PO TABS: 5.0000 mg | ORAL_TABLET | ORAL | Status: DC | PRN

## 2024-03-02 MED ORDER — SODIUM CHLORIDE 0.9 % IV BOLUS
1000.0000 mL | Freq: Once | INTRAVENOUS | Status: AC
  Administered 2024-03-02: 1000 mL via INTRAVENOUS

## 2024-03-02 MED ORDER — ACETAMINOPHEN 650 MG RE SUPP: 650.0000 mg | Freq: Four times a day (QID) | RECTAL | Status: DC | PRN

## 2024-03-02 MED ORDER — ALBUTEROL SULFATE (2.5 MG/3ML) 0.083% IN NEBU: 2.5000 mg | INHALATION_SOLUTION | RESPIRATORY_TRACT | Status: DC | PRN

## 2024-03-02 MED ORDER — LACTATED RINGERS IV BOLUS: 20.0000 mL/kg | Freq: Once | INTRAVENOUS | Status: DC

## 2024-03-02 MED ORDER — DEXTROSE 50 % IV SOLN: 0.0000 mL | INTRAVENOUS | Status: DC | PRN

## 2024-03-02 MED ORDER — ORAL CARE MOUTH RINSE: 15.0000 mL | OROMUCOSAL | Status: DC | PRN

## 2024-03-02 MED ORDER — ACETAMINOPHEN 325 MG PO TABS: 650.0000 mg | ORAL_TABLET | Freq: Four times a day (QID) | ORAL | Status: DC | PRN

## 2024-03-02 MED ORDER — AMLODIPINE BESYLATE 10 MG PO TABS
10.0000 mg | ORAL_TABLET | Freq: Every day | ORAL | Status: DC
  Administered 2024-03-03: 10 mg via ORAL
  Filled 2024-03-02: qty 1

## 2024-03-02 MED ORDER — ATORVASTATIN CALCIUM 40 MG PO TABS: 80.0000 mg | ORAL_TABLET | Freq: Every day | ORAL | Status: DC

## 2024-03-02 MED ORDER — HYDRALAZINE HCL 20 MG/ML IJ SOLN: 10.0000 mg | Freq: Four times a day (QID) | INTRAMUSCULAR | Status: DC | PRN

## 2024-03-02 MED ORDER — ONDANSETRON HCL 4 MG PO TABS: 4.0000 mg | ORAL_TABLET | Freq: Four times a day (QID) | ORAL | Status: DC | PRN

## 2024-03-02 MED ORDER — HYDRALAZINE HCL 20 MG/ML IJ SOLN
10.0000 mg | Freq: Once | INTRAMUSCULAR | Status: AC
  Administered 2024-03-02: 10 mg via INTRAVENOUS
  Filled 2024-03-02: qty 1

## 2024-03-02 MED ORDER — AMLODIPINE BESYLATE 5 MG PO TABS
10.0000 mg | ORAL_TABLET | Freq: Once | ORAL | Status: AC
  Administered 2024-03-02: 10 mg via ORAL
  Filled 2024-03-02: qty 2

## 2024-03-02 MED ORDER — INSULIN REGULAR(HUMAN) IN NACL 100-0.9 UT/100ML-% IV SOLN
INTRAVENOUS | Status: DC
  Administered 2024-03-02: 13 [IU]/h via INTRAVENOUS
  Filled 2024-03-02: qty 100

## 2024-03-02 MED ORDER — DEXTROSE IN LACTATED RINGERS 5 % IV SOLN: INTRAVENOUS | Status: DC

## 2024-03-02 MED ORDER — CHLORHEXIDINE GLUCONATE CLOTH 2 % EX PADS
6.0000 | MEDICATED_PAD | Freq: Every day | CUTANEOUS | Status: DC
  Administered 2024-03-02: 6 via TOPICAL

## 2024-03-02 MED ORDER — LACTATED RINGERS IV SOLN: INTRAVENOUS | Status: DC

## 2024-03-02 MED ORDER — HEPARIN SODIUM (PORCINE) 5000 UNIT/ML IJ SOLN
5000.0000 [IU] | Freq: Three times a day (TID) | INTRAMUSCULAR | Status: DC
  Administered 2024-03-02 – 2024-03-03 (×2): 5000 [IU] via SUBCUTANEOUS
  Filled 2024-03-02 (×3): qty 1

## 2024-03-02 NOTE — Inpatient Diabetes Management (Signed)
 Inpatient Diabetes Program Recommendations  AACE/ADA: New Consensus Statement on Inpatient Glycemic Control (2015)  Target Ranges:  Prepandial:   less than 140 mg/dL      Peak postprandial:   less than 180 mg/dL (1-2 hours)      Critically ill patients:  140 - 180 mg/dL   Lab Results  Component Value Date   GLUCAP 341 (H) 03/02/2024   HGBA1C 10.4 (A) 11/29/2022    Review of Glycemic Control  Diabetes history: DM2 Outpatient Diabetes medications: None Current orders for Inpatient glycemic control: IV insulin  per EndoTool for DKA  HgbA1C 10.4% on 11/29/2022  Inpatient Diabetes Program Recommendations:    Continue IV insulin  until criteria met for discontinuation.  Will f/u in am to discuss his diabetes management.  Thank you. Shona Brandy, RD, LDN, [facility] Inpatient Diabetes Coordinator [phone]

## 2024-03-02 NOTE — ED Triage Notes (Signed)
 Pt has c/o mid chest pain that began this AM. Pt reports being very stressed lately, also has HTN, but has not taken meds in a while

## 2024-03-02 NOTE — H&P (Addendum)
 " History and Physical  DELLAS GUARD FMW:[id number] DOB: 1975-10-15 DOA: 03/02/2024  PCP: Celestia Rosaline SQUIBB, NP   Chief Complaint: Chest pain  HPI: Jason Bruce is a 49 y.o. male with medical history significant for obesity, hypertension, CKD3, diabetes not on any medications due to personal preference being admitted to the hospital with 1 day of chest pain and shortness of breath found to be in diabetic ketoacidosis.  History is provided by the patient as well as his wife who is at the bedside.  Patient states he currently does not take any medications because he does not like how it makes him feel.  His wife at the bedside states that she follows his blood sugar and blood pressure at least once a day, usually in the mornings his blood sugar is in the 200s, she has him eat quite healthy during the day eating fish and chicken and vegetables, avoiding carbs and drinking only 0-calorie drinks.  During the day his blood sugar is usually in the 150s.  Regarding his blood pressure, in the mornings it is often in the systolic 200s, but in the evenings when she checks it is usually in the 120-150 range systolic.  Last night they had some butter chicken for dinner, and some chips and she thinks maybe that forced him over the edge into DKA.  Patient came in the hospital complaining of burning chest pain since last night, denies any fevers, chills, cough, abdominal pain, nausea, vomiting, diarrhea or dysuria.  No sick contacts.  Review of Systems: Please see HPI for pertinent positives and negatives. A complete 10 system review of systems are otherwise negative.  Past Medical History:  Diagnosis Date   Acute hypoxemic respiratory failure due to COVID-19 (HCC) 09/30/2019   AKI (acute kidney injury)    Diabetes mellitus without complication (HCC)    Hypertension    Nephrolithiasis    Pneumonia due to COVID-19 virus 09/30/2019   Sigmoid diverticulitis 09/04/2021   Past Surgical History:  Procedure  Laterality Date   WISDOM TOOTH EXTRACTION     Social History:  reports that he has never smoked. He has never used smokeless tobacco. He reports that he does not drink alcohol and does not use drugs.  Allergies[1]  Family History  Problem Relation Age of Onset   Cancer Mother    Hypertension Other    Diabetes Other    Colon polyps Neg Hx    Colon cancer Neg Hx    Esophageal cancer Neg Hx    Rectal cancer Neg Hx    Stomach cancer Neg Hx      Prior to Admission medications  Medication Sig Start Date End Date Taking? Authorizing Provider  aspirin EC 81 MG tablet Take 162 mg by mouth once as needed (for chest pain- Swallow whole).   Yes [provider]  Accu-Chek FastClix Lancets MISC Use to test blood sugar twice a day 09/20/20   Brien Belvie BRAVO, MD  amLODipine  (NORVASC ) 10 MG tablet Take 1 tablet (10 mg total) by mouth daily. Patient not taking: Reported on 03/02/2024 04/21/23   Celestia Rosaline SQUIBB, NP  atorvastatin  (LIPITOR) 80 MG tablet Take 1 tablet (80 mg total) by mouth daily. Patient not taking: Reported on 03/02/2024 12/24/22   Newlin, Enobong, MD  Blood Glucose Monitoring Suppl (ACCU-CHEK GUIDE) w/Device KIT Test blood sugar twice a day 10/27/19   Brien Belvie BRAVO, MD  glucose blood (ACCU-CHEK GUIDE) test strip Use as instructed 07/05/21   Brien Belvie  E, MD  Insulin  Pen Needle (BD ULTRA-FINE PEN NEEDLES) 29G X 12.7MM MISC Use with Lantus  Solostar pen nightly 07/05/21   Brien Belvie BRAVO, MD  Lancets Misc. (ACCU-CHEK FASTCLIX LANCET) KIT Use to test blood sugar twice a day 07/05/21   Brien Belvie BRAVO, MD  metFORMIN  (GLUCOPHAGE -XR) 500 MG 24 hr tablet Take 1 tablet (500 mg total) by mouth 2 (two) times daily with a meal. Patient not taking: Reported on 03/02/2024 12/24/22   Newlin, Enobong, MD  ondansetron  (ZOFRAN ) 4 MG tablet Take 1 tablet (4 mg total) by mouth every 8 (eight) hours as needed for nausea or vomiting. Patient not taking: Reported on 03/02/2024 07/12/22    Blue, Soijett A, PA-C  ondansetron  (ZOFRAN -ODT) 4 MG disintegrating tablet Take 1 tablet (4 mg total) by mouth every 8 (eight) hours as needed for nausea or vomiting. Patient not taking: Reported on 03/02/2024 11/17/23   Garrick Charleston, MD  oxyCODONE -acetaminophen  (PERCOCET/ROXICET) 5-325 MG tablet Take 1 tablet by mouth every 6 (six) hours as needed for severe pain. Patient not taking: Reported on 03/02/2024 07/12/22   Blue, Soijett A, PA-C  oxyCODONE -acetaminophen  (PERCOCET/ROXICET) 5-325 MG tablet Take 1 tablet by mouth every 6 (six) hours as needed for severe pain (pain score 7-10). Patient not taking: Reported on 03/02/2024 11/17/23   Garrick Charleston, MD  Semaglutide ,0.25 or 0.5MG /DOS, (OZEMPIC , 0.25 OR 0.5 MG/DOSE,) 2 MG/3ML SOPN Inject 0.25 mg into the skin once a week. For 4 weeks. Then, increase to 0.5 mg subcutaneously once weekly for 4 weeks. Patient not taking: Reported on 03/02/2024 12/24/22   Newlin, Enobong, MD  tamsulosin  (FLOMAX ) 0.4 MG CAPS capsule Take 1 capsule (0.4 mg total) by mouth daily. Patient not taking: Reported on 03/02/2024 07/12/22   Blue, Soijett A, PA-C  valsartan  (DIOVAN ) 160 MG tablet Take 1 tablet (160 mg total) by mouth daily. Patient not taking: Reported on 03/02/2024 05/06/23   Celestia Rosaline SQUIBB, NP    Physical Exam: BP (!) 190/169   Pulse 99   Resp 11   Wt (!) 158 kg   SpO2 99%   BMI 51.44 kg/m  General:  Alert, oriented, calm, in no acute distress, obese gentleman resting on room air.  He looks comfortable.  His wife is at the bedside. Cardiovascular: RRR, no murmurs or rubs, no peripheral edema  Respiratory: clear to auscultation bilaterally, no wheezes, no crackles  Abdomen: soft, nontender, nondistended, normal bowel tones heard  Skin: dry, no rashes  Musculoskeletal: no joint effusions, normal range of motion  Psychiatric: appropriate affect, normal speech  Neurologic: extraocular muscles intact, clear speech, moving all extremities with intact  sensorium         Labs on Admission:  Basic Metabolic Panel: Recent Labs  Lab 03/02/24 1200  NA 133*  K 4.2  CL 94*  CO2 21*  GLUCOSE 492*  BUN 20  CREATININE 1.59*  CALCIUM  9.5   Liver Function Tests: Recent Labs  Lab 03/02/24 1200  AST 27  ALT 15  ALKPHOS 86  BILITOT 0.4  PROT 7.4  ALBUMIN 4.1   Recent Labs  Lab 03/02/24 1200  LIPASE 154*   No results for input(s): AMMONIA in the last 168 hours. CBC: No results for input(s): WBC, NEUTROABS, HGB, HCT, MCV, PLT in the last 168 hours. Cardiac Enzymes: No results for input(s): CKTOTAL, CKMB, CKMBINDEX, TROPONINI in the last 168 hours. BNP (last 3 results) No results for input(s): BNP in the last 8760 hours.  ProBNP (last 3 results) Recent Labs  03/02/24 1200  PROBNP 224.0    CBG: Recent Labs  Lab 03/02/24 1346  GLUCAP 399*    Radiological Exams on Admission: DG Chest 2 View Result Date: 03/02/2024 CLINICAL DATA:  Chest pain. EXAM: CHEST - 2 VIEW COMPARISON:  09/29/2019 FINDINGS: Cardiomediastinal silhouette and pulmonary vasculature are within normal limits. Lungs are clear. IMPRESSION: No acute cardiopulmonary process. Electronically Signed   By: Aliene Lloyd M.D.   On: 03/02/2024 11:27   Assessment/Plan MERIK MIGNANO is a 50 y.o. male with medical history significant for obesity, hypertension, CKD3, diabetes not on any medications due to personal preference being admitted to the hospital with 1 day of chest pain and shortness of breath found to be in diabetic ketoacidosis.    DKA-in the setting of untreated diabetes, with anion gap metabolic acidosis, hyperglycemia.  No evidence of acute infection. -Observation admission -Monitor closely on stepdown -IV fluids, IV insulin  drip per Endo tool and routine beta hydroxybutyrate and BMP per protocol -N.p.o. with ice chips and sips with meds, until anion gap is closed x 2 -Diabetic educator consult; patient was strongly counseled  about the danger of leaving his diabetes and hypertension untreated  Hypertensive urgency-presented with chest pain which is now largely resolved, he is supposed to be on amlodipine  and HCTZ as an outpatient, per his wife.  He has not taken any prescription medications for the last several months to a year.  Goal will be to gradually reduce his blood pressure to normal in the next 24 to 48 hours. -Resume home amlodipine  -Hold ARB for now (on this medication per med rec) given renal function -IV hydralazine  as needed for SBP greater than 180  Pseudohyponatremia-likely due to hyperglycemia, anticipate this improving with treatment of his hyperglycemia  CKD stage IIIa-likely due to uncontrolled hypertension and diabetes, renal function has fluctuated over the last few years and now many data points so I am not sure what his baseline renal function is -Avoid nephrotoxins, hydrate and treat diabetes and hypertension as above -Monitor renal function daily and monitor to establish baseline  Elevated lipase-patient without abdominal pain or nausea so doubt pancreatitis.  Regardless he will be n.p.o. and on IV fluids. -Will recheck lipase level in the morning, can plan on imaging if he develops pain or significant nausea  Abnormal troponin-without acute EKG changes.  Troponin is flat.  Likely troponin leak from hypertension, in the setting of impaired renal function. -Monitor on telemetry, no further workup planned  Morbid obesity-BMI 51.4, complicating all aspects of care  DVT prophylaxis: Subcutaneous heparin     Code Status: Full Code  Consults called: None  Admission status: Observation  Time spent: 56 minutes  Arletha Marschke CHRISTELLA Gail MD [facility] Pager [phone]  If 7PM-7AM, please contact night-coverage [email or web address] Password [facility]  03/02/2024, 2:44 PM      [1]  Allergies Allergen Reactions   Shellfish Allergy Anaphylaxis, Shortness Of Breath, Swelling and Dermatitis    Shellfish Protein-Containing Drug Products Anaphylaxis, Shortness Of Breath and Dermatitis   Leek [Allium Porrum] Rash and Other (See Comments)    Facial became very red   "

## 2024-03-02 NOTE — ED Provider Notes (Signed)
 "  Emergency Department Provider Note   I have reviewed the triage vital signs and the nursing notes.   HISTORY  Chief Complaint Chest Pain   HPI Jason Bruce is a 49 y.o. male with past history of diabetes, hypertension, no longer taking his medications presents to the emergency department with intermittent chest discomfort.  He describes a squeezing/sore feeling in the center of his chest radiating slightly to the left.  He is also had some mild shortness of breath with nausea.  No vomiting.  He has stopped taking all of his medications including his medicines for hypertension and diabetes.  States has been going through a lot of stress recently which has contributed to his noncompliance.  No prior history of heart attack or stroke.   Past Medical History:  Diagnosis Date   Acute hypoxemic respiratory failure due to COVID-19 (HCC) 09/30/2019   AKI (acute kidney injury)    Diabetes mellitus without complication (HCC)    Hypertension    Nephrolithiasis    Pneumonia due to COVID-19 virus 09/30/2019   Sigmoid diverticulitis 09/04/2021    Review of Systems  Constitutional: No fever/chills Cardiovascular: Positive chest pain. Respiratory: Positive shortness of breath. Gastrointestinal: No abdominal pain. Positive nausea, no vomiting.  No diarrhea.   Genitourinary: Positive polyuria.  Neurological: Negative for headaches. ____________________________________________   PHYSICAL EXAM:  VITAL SIGNS: ED Triage Vitals  Encounter Vitals Group     BP 03/02/24 1115 (!) 217/137     Pulse Rate 03/02/24 1115 (!) 109     Resp 03/02/24 1115 18     SpO2 03/02/24 1115 93 %     Pain Score 03/02/24 1116 6   Constitutional: Alert and oriented. Well appearing and in no acute distress. Eyes: Conjunctivae are normal.  Head: Atraumatic. Nose: No congestion/rhinnorhea. Mouth/Throat: Mucous membranes are dry. Neck: No stridor.   Cardiovascular: Tachycardia. Good peripheral circulation.  Grossly normal heart sounds.   Respiratory: Normal respiratory effort.  No retractions. Lungs CTAB. Gastrointestinal: Soft and nontender. No distention.  Musculoskeletal: No gross deformities of extremities. Neurologic:  Normal speech and language.  Skin:  Skin is warm, dry and intact. No rash noted.  ____________________________________________   LABS (all labs ordered are listed, but only abnormal results are displayed)  Labs Reviewed  BASIC METABOLIC PANEL WITH GFR - Abnormal; Notable for the following components:      Result Value   Sodium 133 (*)    Chloride 94 (*)    CO2 21 (*)    Glucose, Bld 492 (*)    Creatinine, Ser 1.59 (*)    GFR, Estimated 53 (*)    Anion gap 18 (*)    All other components within normal limits  LIPASE, BLOOD - Abnormal; Notable for the following components:   Lipase 154 (*)    All other components within normal limits  TROPONIN T, HIGH SENSITIVITY - Abnormal; Notable for the following components:   Troponin T High Sensitivity 55 (*)    All other components within normal limits  PROTIME-INR  PRO BRAIN NATRIURETIC PEPTIDE  HEPATIC FUNCTION PANEL  CBC  BLOOD GAS, VENOUS  BASIC METABOLIC PANEL WITH GFR  BASIC METABOLIC PANEL WITH GFR  BASIC METABOLIC PANEL WITH GFR  BASIC METABOLIC PANEL WITH GFR  BETA-HYDROXYBUTYRIC ACID  BETA-HYDROXYBUTYRIC ACID  BETA-HYDROXYBUTYRIC ACID  BETA-HYDROXYBUTYRIC ACID  URINALYSIS, ROUTINE W REFLEX MICROSCOPIC  CBG MONITORING, ED  TROPONIN T, HIGH SENSITIVITY   ____________________________________________  EKG   EKG Interpretation Date/Time:  Tuesday March 02 2024 11:20:23 EST Ventricular Rate:  103 PR Interval:  151 QRS Duration:  111 QT Interval:  321 QTC Calculation: 421 R Axis:   0  Text Interpretation: Sinus tachycardia Probable left atrial enlargement RSR' in V1 or V2, right VCD or RVH Nonspecific T abnormalities, lateral leads ST elevation, consider inferior injury Similar ST changes on tracing  from June 2024 Confirmed by Darra Chew [phone]) on 03/02/2024 11:31:20 AM        ____________________________________________  RADIOLOGY  DG Chest 2 View Result Date: 03/02/2024 CLINICAL DATA:  Chest pain. EXAM: CHEST - 2 VIEW COMPARISON:  09/29/2019 FINDINGS: Cardiomediastinal silhouette and pulmonary vasculature are within normal limits. Lungs are clear. IMPRESSION: No acute cardiopulmonary process. Electronically Signed   By: Aliene Lloyd M.D.   On: 03/02/2024 11:27    ____________________________________________   PROCEDURES  Procedure(s) performed:   Procedures  CRITICAL CARE Performed by: Chew KANDICE Darra Total critical care time: 45 minutes Critical care time was exclusive of separately billable procedures and treating other patients. Critical care was necessary to treat or prevent imminent or life-threatening deterioration. Critical care was time spent personally by me on the following activities: development of treatment plan with patient and/or surrogate as well as nursing, discussions with consultants, evaluation of patient's response to treatment, examination of patient, obtaining history from patient or surrogate, ordering and performing treatments and interventions, ordering and review of laboratory studies, ordering and review of radiographic studies, pulse oximetry and re-evaluation of patient's condition.  Chew Darra, MD Emergency Medicine  ____________________________________________   INITIAL IMPRESSION / ASSESSMENT AND PLAN / ED COURSE  Pertinent labs & imaging results that were available during my care of the patient were reviewed by me and considered in my medical decision making (see chart for details).   This patient is Presenting for Evaluation of CP, which does require a range of treatment options, and is a complaint that involves a high risk of morbidity and mortality.  The Differential Diagnoses includes but is not exclusive to acute coronary  syndrome, aortic dissection, pulmonary embolism, cardiac tamponade, community-acquired pneumonia, pericarditis, musculoskeletal chest wall pain, etc.   Critical Interventions-    Medications  lactated ringers  bolus 3,160 mL (has no administration in time range)  insulin  regular, human (MYXREDLIN ) 100 units/ 100 mL infusion (has no administration in time range)  lactated ringers  infusion (has no administration in time range)  dextrose  5 % in lactated ringers  infusion (has no administration in time range)  dextrose  50 % solution 0-50 mL (has no administration in time range)  potassium chloride  10 mEq in 100 mL IVPB (has no administration in time range)  hydrALAZINE  (APRESOLINE ) injection 10 mg (has no administration in time range)  alum & mag hydroxide-simeth (MAALOX/MYLANTA) 200-200-20 MG/5ML suspension 30 mL (has no administration in time range)  amLODipine  (NORVASC ) tablet 10 mg (10 mg Oral Given 03/02/24 1223)    Reassessment after intervention: CBG down-trending.    I did obtain Additional Historical Information from wife at bedside.   I decided to review pertinent External Data, and in summary last ED visits was in the setting of ureterolithiasis.   Clinical Laboratory Tests Ordered, included BMP shows glucose of 492 with CO2 of 21 and gap of 18 consistent with DKA.  Creatinine 1.59 similar to prior values.  proBNP normal with troponin of 55.  Radiologic Tests Ordered, included CXR. I independently interpreted the images and agree with radiology interpretation.   Cardiac Monitor Tracing which shows sinus tachycardia.    Social  Determinants of Health Risk patient is a non-smoker.   Consult complete with [facility]. Plan for admit.   Medical Decision Making: Summary:  Patient presents emergency department with chest pain.  Very high BP on arrival.  Patient poorly compliant with his blood pressure medications as well as his diabetes medicines.  Plan for screening labs to rule out DKA,  acute hypertensive emergency, NSTEMI. EKG with no acute ischemic changes.   Reevaluation with update and discussion with patient.  Labs consistent with DKA.  Plan to start on Endo tool with IV fluids.  Troponin elevated mildly to 55.  Plan for trending.  This could represent demand ischemia but lower suspicion for NSTEMI or primary ACS etiology of chest pain.   Patient's presentation is most consistent with acute presentation with potential threat to life or bodily function.   Disposition: ADMIT  ____________________________________________  FINAL CLINICAL IMPRESSION(S) / ED DIAGNOSES  Final diagnoses:  Diabetic ketoacidosis without coma associated with type 2 diabetes mellitus (HCC)  Precordial chest pain  Primary hypertension    Note:  This document was prepared using Dragon voice recognition software and may include unintentional dictation errors.  Fonda Law, MD, [facility] Emergency Medicine    Alexandria Shiflett, Fonda MATSU, MD 03/02/24 1458  "

## 2024-03-03 ENCOUNTER — Other Ambulatory Visit (HOSPITAL_COMMUNITY): Payer: Self-pay

## 2024-03-03 DIAGNOSIS — E1165 Type 2 diabetes mellitus with hyperglycemia: Secondary | ICD-10-CM | POA: Insufficient documentation

## 2024-03-03 LAB — CBC
HCT: 37.3 % — ABNORMAL LOW (ref 39.0–52.0)
Hemoglobin: 12.1 g/dL — ABNORMAL LOW (ref 13.0–17.0)
MCH: 28 pg (ref 26.0–34.0)
MCHC: 32.4 g/dL (ref 30.0–36.0)
MCV: 86.3 fL (ref 80.0–100.0)
Platelets: 248 K/uL (ref 150–400)
RBC: 4.32 MIL/uL (ref 4.22–5.81)
RDW: 13.5 % (ref 11.5–15.5)
WBC: 7 K/uL (ref 4.0–10.5)
nRBC: 0 % (ref 0.0–0.2)

## 2024-03-03 LAB — BASIC METABOLIC PANEL WITH GFR
Anion gap: 10 (ref 5–15)
BUN: 13 mg/dL (ref 6–20)
CO2: 27 mmol/L (ref 22–32)
Calcium: 9.2 mg/dL (ref 8.9–10.3)
Chloride: 101 mmol/L (ref 98–111)
Creatinine, Ser: 1.17 mg/dL (ref 0.61–1.24)
GFR, Estimated: >60 mL/min
Glucose, Bld: 178 mg/dL — ABNORMAL HIGH (ref 70–99)
Potassium: 3.5 mmol/L (ref 3.5–5.1)
Sodium: 138 mmol/L (ref 135–145)

## 2024-03-03 LAB — GLUCOSE, CAPILLARY
Glucose-Capillary: 146 mg/dL — ABNORMAL HIGH (ref 70–99)
Glucose-Capillary: 150 mg/dL — ABNORMAL HIGH (ref 70–99)
Glucose-Capillary: 162 mg/dL — ABNORMAL HIGH (ref 70–99)
Glucose-Capillary: 172 mg/dL — ABNORMAL HIGH (ref 70–99)
Glucose-Capillary: 257 mg/dL — ABNORMAL HIGH (ref 70–99)

## 2024-03-03 LAB — HIV ANTIBODY (ROUTINE TESTING W REFLEX): HIV Screen 4th Generation wRfx: NONREACTIVE

## 2024-03-03 LAB — LIPASE, BLOOD: Lipase: 43 U/L (ref 11–51)

## 2024-03-03 LAB — BETA-HYDROXYBUTYRIC ACID: Beta-Hydroxybutyric Acid: 0.59 mmol/L — ABNORMAL HIGH (ref 0.05–0.27)

## 2024-03-03 MED ORDER — INSULIN ASPART 100 UNIT/ML IJ SOLN
4.0000 [IU] | Freq: Three times a day (TID) | INTRAMUSCULAR | Status: DC
  Administered 2024-03-03: 4 [IU] via SUBCUTANEOUS
  Filled 2024-03-03: qty 4

## 2024-03-03 MED ORDER — BLOOD GLUCOSE TEST VI STRP
1.0000 | ORAL_STRIP | 0 refills | Status: AC
  Filled 2024-03-03: qty 100, 25d supply, fill #0

## 2024-03-03 MED ORDER — ACCU-CHEK SOFTCLIX LANCETS MISC
1.0000 | 0 refills | Status: AC
  Filled 2024-03-03: qty 100, 25d supply, fill #0

## 2024-03-03 MED ORDER — INSULIN LISPRO (1 UNIT DIAL) 100 UNIT/ML (KWIKPEN)
6.0000 [IU] | PEN_INJECTOR | Freq: Three times a day (TID) | SUBCUTANEOUS | 0 refills | Status: AC
  Filled 2024-03-03: qty 15, 84d supply, fill #0

## 2024-03-03 MED ORDER — PEN NEEDLES 31G X 5 MM MISC
1.0000 | 0 refills | Status: AC
  Filled 2024-03-03: qty 100, 25d supply, fill #0

## 2024-03-03 MED ORDER — AMLODIPINE BESYLATE 10 MG PO TABS
10.0000 mg | ORAL_TABLET | Freq: Every day | ORAL | 0 refills | Status: AC
  Filled 2024-03-03: qty 30, 30d supply, fill #0

## 2024-03-03 MED ORDER — INSULIN ASPART 100 UNIT/ML IJ SOLN
0.0000 [IU] | Freq: Three times a day (TID) | INTRAMUSCULAR | Status: DC
  Administered 2024-03-03: 8 [IU] via SUBCUTANEOUS
  Administered 2024-03-03: 3 [IU] via SUBCUTANEOUS
  Filled 2024-03-03: qty 8
  Filled 2024-03-03: qty 3

## 2024-03-03 MED ORDER — INSULIN ASPART 100 UNIT/ML IJ SOLN: 0.0000 [IU] | Freq: Every day | INTRAMUSCULAR | Status: DC

## 2024-03-03 MED ORDER — MUPIROCIN 2 % EX OINT
TOPICAL_OINTMENT | Freq: Two times a day (BID) | CUTANEOUS | Status: DC
  Filled 2024-03-03 (×2): qty 22

## 2024-03-03 MED ORDER — LISINOPRIL 20 MG PO TABS
20.0000 mg | ORAL_TABLET | Freq: Every day | ORAL | Status: DC
  Administered 2024-03-03: 20 mg via ORAL
  Filled 2024-03-03: qty 1

## 2024-03-03 MED ORDER — ATORVASTATIN CALCIUM 80 MG PO TABS
80.0000 mg | ORAL_TABLET | Freq: Every day | ORAL | 0 refills | Status: AC
  Filled 2024-03-03: qty 30, 30d supply, fill #0

## 2024-03-03 MED ORDER — INSULIN GLARGINE 100 UNIT/ML ~~LOC~~ SOLN
12.0000 [IU] | Freq: Every day | SUBCUTANEOUS | Status: DC
  Administered 2024-03-03: 12 [IU] via SUBCUTANEOUS
  Filled 2024-03-03 (×2): qty 0.12

## 2024-03-03 MED ORDER — LANCET DEVICE MISC
1.0000 | 0 refills | Status: AC
  Filled 2024-03-03: qty 1, fill #0

## 2024-03-03 MED ORDER — ACCU-CHEK GUIDE ME W/DEVICE KIT
1.0000 | PACK | 0 refills | Status: AC
  Filled 2024-03-03: qty 1, 30d supply, fill #0

## 2024-03-03 MED ORDER — LISINOPRIL 20 MG PO TABS
20.0000 mg | ORAL_TABLET | Freq: Every day | ORAL | 0 refills | Status: AC
  Filled 2024-03-03: qty 30, 30d supply, fill #0

## 2024-03-03 MED ORDER — INSULIN GLARGINE 100 UNIT/ML SOLOSTAR PEN
20.0000 [IU] | PEN_INJECTOR | Freq: Every day | SUBCUTANEOUS | 0 refills | Status: AC
  Filled 2024-03-03: qty 15, 75d supply, fill #0

## 2024-03-03 NOTE — Progress Notes (Signed)
 I attest to student documentation.   Lydia Meng, MSN-RN Clinical Instructor/Nursing Faculty [facility] Associate Degree Nursing Program

## 2024-03-03 NOTE — Plan of Care (Signed)

## 2024-03-03 NOTE — Progress Notes (Addendum)
 Anion gap has been closed x 2 collections. HgbA1c 12.6.  Have started this patient on Lantus  insulin  12 units nightly first dose now.  Started moderate sliding scale insulin  and carbohydrate modified diet.  Patient with longstanding history medication noncompliance.  History of variable renal function but is demonstrated proteinuria so given underlying hypertension diabetes would benefit from ACE inhibitor at time of discharge.  Diabetes educator consulted for additional education including need for injectable insulin  at discharge.  Patient is stable enough to move out of the stepdown unit so transfer orders to MedSurg floor placed-most recent beta is 0.07.

## 2024-03-03 NOTE — Progress Notes (Signed)
 Discharge meds in a secure bag delivered to patient by this RN

## 2024-03-03 NOTE — Inpatient Diabetes Management (Signed)
 Inpatient Diabetes Program Recommendations  AACE/ADA: New Consensus Statement on Inpatient Glycemic Control (2015)  Target Ranges:  Prepandial:   less than 140 mg/dL      Peak postprandial:   less than 180 mg/dL (1-2 hours)      Critically ill patients:  140 - 180 mg/dL   Lab Results  Component Value Date   GLUCAP 257 (H) 03/03/2024   HGBA1C 12.6 (H) 03/02/2024    Review of Glycemic Control  Diabetes history: DM2 Outpatient Diabetes medications: None Current orders for Inpatient glycemic control: Lantus  12 at bedtime, Novolog  0-15 TID with meals and 905 HS + 4 units TID  HgbA1C - 12.6%   Inpatient Diabetes Program Recommendations:    Increase Lantus  to 20 units at bedtime  Increase Novolog  to 6 units TID with meals if eating > 50%  Spoke with pt at bedside regarding his diabetes and HgbA1C of 12.6%. Patient reports checking glucose 1-2 times per day and that it is usually in the high100's mg/dl in the morning and goes up in the 200-300's mg/dl during the day after eating.  Inquired about prior A1C and patient reports not being able to recall last A1C value. Discussed A1C results (12.6 ) and explained that current A1C indicates an average glucose of 315 mg/dl over the past 2-3 months. Discussed glucose and A1C goals. Discussed importance of checking CBGs and maintaining good CBG control to prevent long-term and short-term complications. Explained how hyperglycemia leads to damage within blood vessels which lead to the common complications seen with uncontrolled diabetes. Stressed to the patient the importance of improving glycemic control to prevent further complications from uncontrolled diabetes. Discussed impact of nutrition, exercise, stress, sickness, and medications on diabetes control. Pt has taken Lantus  in the past with insulin  pen. States he will f/u with PCP within a week or two of discharge. Reviewed insulin  pen administration. States he will speak with his wife regarding a FS  Libre for glucose monitoring. He will check with PCP for prescription if phone is compatible. Stressed importance of eating healthy, leaving off juices and sugary beverages, walking daily, monitoring his blood sugars B,L, D, and HS, f/u with PCP within a week or two.  Pt voices understanding and seems motivated to make changes with lifestyle and taking care of his diabetes.  Discussed above with RN.  Thank you. Shona Brandy, RD, LDN, CDCES Inpatient Diabetes Coordinator [phone]

## 2024-03-03 NOTE — Discharge Summary (Signed)
 Physician Discharge Summary  Jason Bruce FMW:[id number] DOB: 31-Dec-1975 DOA: 03/02/2024  PCP: Celestia Rosaline SQUIBB, NP  Admit date: 03/02/2024 Discharge date: 03/03/2024  Admitted From: Home Disposition:  Home  Recommendations for Outpatient Follow-up:  Follow up with PCP in 1 week  Discharge Condition: Stable CODE STATUS: Full  Diet recommendation: Carb modified   Brief/Interim Summary: From H&P by Dr. Zella: HPI: Jason Bruce is a 49 y.o. male with medical history significant for obesity, hypertension, CKD3, diabetes not on any medications due to personal preference being admitted to the hospital with 1 day of chest pain and shortness of breath found to be in diabetic ketoacidosis.  History is provided by the patient as well as his wife who is at the bedside.  Patient states he currently does not take any medications because he does not like how it makes him feel.  His wife at the bedside states that she follows his blood sugar and blood pressure at least once a day, usually in the mornings his blood sugar is in the 200s, she has him eat quite healthy during the day eating fish and chicken and vegetables, avoiding carbs and drinking only 0-calorie drinks.  During the day his blood sugar is usually in the 150s.  Regarding his blood pressure, in the mornings it is often in the systolic 200s, but in the evenings when she checks it is usually in the 120-150 range systolic.  Last night they had some butter chicken for dinner, and some chips and she thinks maybe that forced him over the edge into DKA.  Patient came in the hospital complaining of burning chest pain since last night, denies any fevers, chills, cough, abdominal pain, nausea, vomiting, diarrhea or dysuria.  No sick contacts.   Patient improved on IV insulin  and transitioned to subcutaneous insulin . He met with DM coordinator and was discharged on insulin  as well as antihypertensive medications. His Ha1c was 12.6.   Discharge  Diagnoses:  Principal Problem:   DKA (diabetic ketoacidosis) (HCC) Active Problems:   Hypertension   BMI 45.0-49.9, adult (HCC)   Uncontrolled type 2 diabetes mellitus with hyperglycemia, without long-term current use of insulin  (HCC) CKD ruled out  Discharge Instructions  Discharge Instructions     Amb Referral to Nutrition and Diabetic Education   Complete by: As directed    Call MD for:  difficulty breathing, headache or visual disturbances   Complete by: As directed    Call MD for:  extreme fatigue   Complete by: As directed    Call MD for:  persistant dizziness or light-headedness   Complete by: As directed    Call MD for:  persistant nausea and vomiting   Complete by: As directed    Call MD for:  severe uncontrolled pain   Complete by: As directed    Call MD for:  temperature >100.4   Complete by: As directed    Diet Carb Modified   Complete by: As directed    Discharge instructions   Complete by: As directed    You were cared for by a hospitalist during your hospital stay. If you have any questions about your discharge medications or the care you received while you were in the hospital after you are discharged, you can call the unit and ask to speak with the hospitalist on call if the hospitalist that took care of you is not available. Once you are discharged, your primary care physician will handle any further medical issues. Please note  that NO REFILLS for any discharge medications will be authorized once you are discharged, as it is imperative that you return to your primary care physician (or establish a relationship with a primary care physician if you do not have one) for your aftercare needs so that they can reassess your need for medications and monitor your lab values.   Increase activity slowly   Complete by: As directed       Allergies as of 03/03/2024       Reactions   Shellfish Allergy Anaphylaxis, Shortness Of Breath, Swelling, Dermatitis   Shellfish  Protein-containing Drug Products Anaphylaxis, Shortness Of Breath, Dermatitis   Leek [allium Porrum] Rash, Other (See Comments)   Facial became very red        Medication List     STOP taking these medications    Accu-Chek FastClix Lancet Kit   Accu-Chek Guide test strip Generic drug: glucose blood Replaced by: BLOOD GLUCOSE TEST STRIPS Strp   Accu-Chek Guide w/Device Kit Replaced by: Blood Glucose Monitoring Suppl Devi   metFORMIN  500 MG 24 hr tablet Commonly known as: GLUCOPHAGE -XR   ondansetron  4 MG disintegrating tablet Commonly known as: ZOFRAN -ODT   ondansetron  4 MG tablet Commonly known as: ZOFRAN    oxyCODONE -acetaminophen  5-325 MG tablet Commonly known as: PERCOCET/ROXICET   Ozempic  (0.25 or 0.5 MG/DOSE) 2 MG/3ML Sopn Generic drug: Semaglutide (0.25 or 0.5MG /DOS)   tamsulosin  0.4 MG Caps capsule Commonly known as: FLOMAX    valsartan  160 MG tablet Commonly known as: DIOVAN        TAKE these medications    amLODipine  10 MG tablet Commonly known as: NORVASC  Take 1 tablet (10 mg total) by mouth daily.   aspirin EC 81 MG tablet Take 162 mg by mouth once as needed (for chest pain- Swallow whole).   atorvastatin  80 MG tablet Commonly known as: LIPITOR Take 1 tablet (80 mg total) by mouth daily.   Blood Glucose Monitoring Suppl Devi 1 each by Does not apply route as directed. Dispense based on patient and insurance preference. Use up to four times daily as directed. (FOR ICD-10 E10.9, E11.9). Replaces: Accu-Chek Guide w/Device Kit   BLOOD GLUCOSE TEST STRIPS Strp 1 each by Does not apply route as directed. Dispense based on patient and insurance preference. Use up to four times daily as directed. (FOR ICD-10 E10.9, E11.9). Replaces: Accu-Chek Guide test strip   insulin  glargine 100 UNIT/ML Solostar Pen Commonly known as: LANTUS  Inject 20 Units into the skin at bedtime. May substitute as needed per insurance.   insulin  lispro 100 UNIT/ML  KwikPen Commonly known as: HUMALOG  Inject 6 Units into the skin 3 (three) times daily with meals. Only take if eating a meal AND Blood Glucose (BG) is 80 or higher.   Lancet Device Misc 1 each by Does not apply route as directed. Dispense based on patient and insurance preference. Use up to four times daily as directed. (FOR ICD-10 E10.9, E11.9).   Lancets Misc 1 each by Does not apply route as directed. Dispense based on patient and insurance preference. Use up to four times daily as directed. (FOR ICD-10 E10.9, E11.9). What changed:  how much to take how to take this when to take this additional instructions   lisinopril  20 MG tablet Commonly known as: ZESTRIL  Take 1 tablet (20 mg total) by mouth daily. Start taking on: March 04, 2024   Pen Needles 31G X 5 MM Misc 1 each by Does not apply route as directed. Dispense based on patient and insurance  preference. Use up to four times daily as directed. (FOR ICD-10 E10.9, E11.9). What changed:  medication strength how much to take how to take this when to take this additional instructions        Follow-up Information     Celestia Rosaline SQUIBB, NP. Schedule an appointment as soon as possible for a visit in 1 week(s).   Specialty: Internal Medicine Contact information: [address] [place] [address] [phone]                Allergies[1]    Procedures/Studies: DG Chest 2 View Result Date: 03/02/2024 CLINICAL DATA:  Chest pain. EXAM: CHEST - 2 VIEW COMPARISON:  09/29/2019 FINDINGS: Cardiomediastinal silhouette and pulmonary vasculature are within normal limits. Lungs are clear. IMPRESSION: No acute cardiopulmonary process. Electronically Signed   By: Aliene Lloyd M.D.   On: 03/02/2024 11:27      Discharge Exam: Vitals:   03/03/24 0924 03/03/24 1213  BP: (!) 166/96 (!) 153/93  Pulse:  94  Resp:  19  Temp:  98.2 F (36.8 C)  SpO2:  99%   General: Pt is alert, awake, not in acute  distress Cardiovascular: RRR, S1/S2 +, no edema Respiratory: CTA bilaterally, no wheezing, no rhonchi, no respiratory distress, no conversational dyspnea  Abdominal: Soft, NT, ND, bowel sounds + Extremities: no edema, no cyanosis Psych: Normal mood and affect, stable judgement and insight     The results of significant diagnostics from this hospitalization (including imaging, microbiology, ancillary and laboratory) are listed below for reference.     Microbiology: Recent Results (from the past 240 hours)  MRSA Next Gen by PCR, Nasal     Status: Abnormal   Collection Time: 03/02/24  8:32 PM   Specimen: Nasal Mucosa; Nasal Swab  Result Value Ref Range Status   MRSA by PCR Next Gen DETECTED (A) NOT DETECTED Final    Comment: (NOTE) The GeneXpert MRSA Assay (FDA approved for NASAL specimens only), is one component of a comprehensive MRSA colonization surveillance program. It is not intended to diagnose MRSA infection nor to guide or monitor treatment for MRSA infections. Test performance is not FDA approved in patients less than 61 years old. Performed at [facility], [address], [place], [place] [address]      Labs: BNP (last 3 results) No results for input(s): BNP in the last 8760 hours. Basic Metabolic Panel: Recent Labs  Lab 03/02/24 1200 03/02/24 1350 03/02/24 1726 03/02/24 2124 03/03/24 0514  NA 133* 134* 137 136 138  K 4.2 4.3 3.7 3.6 3.5  CL 94* 97* 99 100 101  CO2 21* 22 26 26 27   GLUCOSE 492* 449* 220* 187* 178*  BUN 20 22* 19 17 13   CREATININE 1.59* 1.45* 1.27* 1.20 1.17  CALCIUM  9.5 9.7 9.9 9.3 9.2   Liver Function Tests: Recent Labs  Lab 03/02/24 1200  AST 27  ALT 15  ALKPHOS 86  BILITOT 0.4  PROT 7.4  ALBUMIN 4.1   Recent Labs  Lab 03/02/24 1200 03/03/24 0514  LIPASE 154* 43   No results for input(s): AMMONIA in the last 168 hours. CBC: Recent Labs  Lab 03/02/24 1726 03/03/24 0514  WBC 8.5 7.0  HGB 13.1  12.1*  HCT 39.8 37.3*  MCV 86.1 86.3  PLT 280 248   Cardiac Enzymes: No results for input(s): CKTOTAL, CKMB, CKMBINDEX, TROPONINI in the last 168 hours. BNP: Invalid input(s): POCBNP CBG: Recent Labs  Lab 03/03/24 0007 03/03/24 0132 03/03/24 0238 03/03/24  0802 03/03/24 1212  GLUCAP 172* 146* 150* 162* 257*   D-Dimer No results for input(s): DDIMER in the last 72 hours. Hgb A1c Recent Labs    03/02/24 1726  HGBA1C 12.6*   Lipid Profile No results for input(s): CHOL, HDL, LDLCALC, TRIG, CHOLHDL, LDLDIRECT in the last 72 hours. Thyroid  function studies No results for input(s): TSH, T4TOTAL, T3FREE, THYROIDAB in the last 72 hours.  Invalid input(s): FREET3 Anemia work up No results for input(s): VITAMINB12, FOLATE, FERRITIN, TIBC, IRON, RETICCTPCT in the last 72 hours. Urinalysis    Component Value Date/Time   COLORURINE STRAW (A) 03/02/2024 1726   APPEARANCEUR CLEAR 03/02/2024 1726   APPEARANCEUR Cloudy 08/11/2011 2105   LABSPEC 1.025 03/02/2024 1726   LABSPEC 1.016 08/11/2011 2105   PHURINE 5.0 03/02/2024 1726   GLUCOSEU >=500 (A) 03/02/2024 1726   GLUCOSEU Negative 08/11/2011 2105   HGBUR NEGATIVE 03/02/2024 1726   BILIRUBINUR NEGATIVE 03/02/2024 1726   BILIRUBINUR Negative 08/11/2011 2105   KETONESUR NEGATIVE 03/02/2024 1726   PROTEINUR 30 (A) 03/02/2024 1726   NITRITE NEGATIVE 03/02/2024 1726   LEUKOCYTESUR NEGATIVE 03/02/2024 1726   LEUKOCYTESUR Negative 08/11/2011 2105   Sepsis Labs Recent Labs  Lab 03/02/24 1726 03/03/24 0514  WBC 8.5 7.0   Microbiology Recent Results (from the past 240 hours)  MRSA Next Gen by PCR, Nasal     Status: Abnormal   Collection Time: 03/02/24  8:32 PM   Specimen: Nasal Mucosa; Nasal Swab  Result Value Ref Range Status   MRSA by PCR Next Gen DETECTED (A) NOT DETECTED Final    Comment: (NOTE) The GeneXpert MRSA Assay (FDA approved for NASAL specimens only), is one  component of a comprehensive MRSA colonization surveillance program. It is not intended to diagnose MRSA infection nor to guide or monitor treatment for MRSA infections. Test performance is not FDA approved in patients less than 80 years old. Performed at [facility], [address] [place], [place] [address]      Patient was seen and examined on the day of discharge and was found to be in stable condition. Time coordinating discharge: 40 minutes including assessment and coordination of care, as well as examination of the patient.   SIGNED:  Delon Hoe, DO [facility] 03/03/2024, 1:28 PM       [1]  Allergies Allergen Reactions   Shellfish Allergy Anaphylaxis, Shortness Of Breath, Swelling and Dermatitis   Shellfish Protein-Containing Drug Products Anaphylaxis, Shortness Of Breath and Dermatitis   Leek [Allium Porrum] Rash and Other (See Comments)    Facial became very red

## 2024-03-03 NOTE — Progress Notes (Signed)
 AVS reviewed with patient who verbalized an understanding- no other questions at this time. PIV removed as noted. Patient dressing for discharge- wife enroute

## 2024-03-03 NOTE — Progress Notes (Signed)
" °   03/03/24 1152  TOC Brief Assessment  Insurance and Status Reviewed  Patient has primary care physician Yes  Home environment has been reviewed resides in a private residence  Prior level of function: Independent  Prior/Current Home Services No current home services  Social Drivers of Health Review SDOH reviewed no interventions necessary  Readmission risk has been reviewed Yes  Transition of care needs no transition of care needs at this time    "

## 2024-03-03 NOTE — Plan of Care (Signed)

## 2024-03-04 ENCOUNTER — Telehealth: Payer: Self-pay

## 2024-03-04 NOTE — Transitions of Care (Post Inpatient/ED Visit) (Signed)
" ° °  03/04/2024  Name: Jason Bruce MRN: [id number] DOB: 1975/04/02  Today's TOC FU Call Status: Today's TOC FU Call Status:: Unsuccessful Call (1st Attempt) Unsuccessful Call (1st Attempt) Date: 03/04/24  Attempted to reach the patient regarding the most recent Inpatient/ED visit.  Follow Up Plan: Additional outreach attempts will be made to reach the patient to complete the Transitions of Care (Post Inpatient/ED visit) call.   Signature  Slater Diesel, RN   "

## 2024-03-05 ENCOUNTER — Telehealth: Payer: Self-pay | Admitting: Primary Care

## 2024-03-05 NOTE — Telephone Encounter (Signed)
 Left VM with pt about their upcoming appt. Pt did not answer

## 2024-03-08 ENCOUNTER — Telehealth: Payer: Self-pay

## 2024-03-08 NOTE — Telephone Encounter (Signed)
 Due to inclement weather, our office will be closed tomorrow.03/09/2024 Well reach out to reschedule your appointment. Thank you. [phone] If the pt calls back please resch appt

## 2024-03-09 ENCOUNTER — Telehealth: Payer: Self-pay

## 2024-03-09 ENCOUNTER — Inpatient Hospital Stay: Payer: Self-pay | Admitting: Primary Care

## 2024-03-09 ENCOUNTER — Telehealth (INDEPENDENT_AMBULATORY_CARE_PROVIDER_SITE_OTHER): Payer: Self-pay | Admitting: Primary Care

## 2024-03-09 NOTE — Transitions of Care (Post Inpatient/ED Visit) (Signed)
 "  03/09/2024  Name: Jason Bruce MRN: [id number] DOB: [birth year]  Today's TOC FU Call Status: Today's TOC FU Call Status:: Successful TOC FU Call Completed Unsuccessful Call (1st Attempt) Date: 03/04/24 [facility] FU Call Complete Date: 03/09/24  Patient's Name and Date of Birth confirmed. Name, DOB  Transition Care Management Follow-up Telephone Call Date of Discharge: 03/03/24 Discharge Facility: [facility]) Type of Discharge: Inpatient Admission Primary Inpatient Discharge Diagnosis:: DKA How have you been since you were released from the hospital?: Better (He stated that he is just trying to get adjusted to taking insulin  multiple times a day.) Any questions or concerns?: No  Items Reviewed: Did you receive and understand the discharge instructions provided?: Yes Medications obtained,verified, and reconciled?: Yes (Medications Reviewed) (He has all medications as well as a working glucometer.) Any new allergies since your discharge?: No Dietary orders reviewed?: Yes Type of Diet Ordered:: carb modified Do you have support at home?: Yes People in Home [RPT]: spouse Name of Support/Comfort Primary Source: his wife  Medications Reviewed Today: Medications Reviewed Today     Reviewed by Marvis Bradley, RN (Case Manager) on 03/09/24 at 1045  Med List Status: <None>   Medication Order Taking? Sig Documenting Provider Last Dose Status Informant  Accu-Chek Softclix Lancets lancets [id number]  Use to check blood sugar up to 4 times daily Rojelio Nest, DO  Active   amLODipine  (NORVASC ) 10 MG tablet [id number]  Take 1 tablet (10 mg total) by mouth daily. Rojelio Nest, DO  Active   aspirin EC 81 MG tablet [id number] No Take 162 mg by mouth once as needed (for chest pain- Swallow whole). [provider] 03/02/2024  7:45 AM Active Multiple Informants  atorvastatin  (LIPITOR) 80 MG tablet [id number]  Take 1 tablet (80 mg total) by mouth daily. Rojelio Nest, DO  Active   Blood  Glucose Monitoring Suppl (ACCU-CHEK GUIDE ME) w/Device KIT [id number]  Use as directed to check blood sugar Rojelio Nest, DO  Active   Glucose Blood (BLOOD GLUCOSE TEST STRIPS) STRP [id number]  Use up to four times daily as directed. (FOR ICD-10 E10.9, E11.9). Rojelio Nest, DO  Active   insulin  glargine (LANTUS ) 100 UNIT/ML Solostar Pen [id number]  Inject 20 Units into the skin at bedtime. May substitute as needed per insurance. Rojelio Nest, DO  Active   insulin  lispro (HUMALOG ) 100 UNIT/ML KwikPen [id number]  Inject 6 Units into the skin 3 (three) times daily with meals. Only take if eating a meal AND Blood Glucose (BG) is 80 or higher. Rojelio Nest, DO  Active   Insulin  Pen Needle (PEN NEEDLES) 31G X 5 MM MISC [id number]  1 each by Does not apply route as directed. Dispense based on patient and insurance preference. Use up to four times daily as directed. (FOR ICD-10 E10.9, E11.9). Rojelio Nest, DO  Active   Lancet Device MISC [id number]  1 each by Does not apply route as directed. Dispense based on patient and insurance preference. Use up to four times daily as directed. (FOR ICD-10 E10.9, E11.9). Rojelio Nest, DO  Active   lisinopril  (ZESTRIL ) 20 MG tablet [id number]  Take 1 tablet (20 mg total) by mouth daily. Rojelio Nest, DO  Active             Home Care and Equipment/Supplies: Were Home Health Services Ordered?: No Any new equipment or medical supplies ordered?: No  Functional Questionnaire: Do you need assistance with bathing/showering or dressing?: No Do you need assistance with meal preparation?: Yes (He  said his wife is assisting him with preparing the appropriate diet.) Do you need assistance with eating?: No Do you have difficulty maintaining continence: No Do you need assistance with getting out of bed/getting out of a chair/moving?: No Do you have difficulty managing or taking your medications?: No  Follow up appointments reviewed: PCP Follow-up appointment  confirmed?: Yes Date of PCP follow-up appointment?: 03/17/24 Follow-up Provider: Rosaline Bohr, NP.   HIs appointment for today was re-scheduled due to the weather. Specialist Hospital Follow-up appointment confirmed?: NA Do you need transportation to your follow-up appointment?: No Do you understand care options if your condition(s) worsen?: Yes-patient verbalized understanding    SIGNATURE Slater Diesel, RN   "

## 2024-03-09 NOTE — Telephone Encounter (Signed)
 Called pt to resch appt. Pt was able to do that at this time

## 2024-03-17 ENCOUNTER — Ambulatory Visit (INDEPENDENT_AMBULATORY_CARE_PROVIDER_SITE_OTHER): Payer: Self-pay | Admitting: Primary Care

## 2024-03-17 ENCOUNTER — Encounter (INDEPENDENT_AMBULATORY_CARE_PROVIDER_SITE_OTHER): Payer: Self-pay | Admitting: Primary Care

## 2024-03-17 VITALS — BP 135/86 | HR 98 | Temp 97.9°F | Resp 16 | Ht 69.0 in | Wt 334.4 lb

## 2024-03-17 DIAGNOSIS — E1165 Type 2 diabetes mellitus with hyperglycemia: Secondary | ICD-10-CM

## 2024-03-17 DIAGNOSIS — I152 Hypertension secondary to endocrine disorders: Secondary | ICD-10-CM

## 2024-03-17 DIAGNOSIS — Z09 Encounter for follow-up examination after completed treatment for conditions other than malignant neoplasm: Secondary | ICD-10-CM

## 2024-03-17 DIAGNOSIS — G4733 Obstructive sleep apnea (adult) (pediatric): Secondary | ICD-10-CM

## 2024-03-17 MED ORDER — TIRZEPATIDE 2.5 MG/0.5ML ~~LOC~~ SOAJ: 2.5000 mg | SUBCUTANEOUS | Status: AC

## 2024-03-17 NOTE — Progress Notes (Unsigned)
 Medication Samples have been provided to the patient.  Drug name: Mounjaro   Strength: 2.5MG         Qty: 1 box with 4 pens LOT: [id number]  Exp.Date: 04/06/2025  Dosing instructions: Inject 2.5 Mg once a week   The patient has been instructed regarding the correct time, dose, and frequency of taking this medication, including desired effects and most common side effects.   Jason Bruce 2:57 PM 03/17/2024

## 2024-03-17 NOTE — Progress Notes (Unsigned)
 "  Subjective:  [name] is a 49 y.o. male presents for hospital follow up  Admit date to the hospital was 03/02/24, patient was discharged from the hospital on 03/03/24, patient was admitted for: Hypertension, BMI 45.0-49.9, adult (HCC), DKA (diabetic ketoacidosis) (HCC) and Uncontrolled type 2 diabetes mellitus with hyperglycemia, without long-term current use of insulin . Patient presents with possible obstructive sleep apnea. Patent has a 3 years history of symptoms of daytime fatigue, morning fatigue, and morning headache. Patient generally gets 5 or 6 hours of sleep per night, and states they generally have nightime awakenings. Snoring of moderate severity is present. Apneic episodes are present. Nasal obstruction is not present.  Patient has not had tonsillectomy.    Past Medical History:  Diagnosis Date   Acute hypoxemic respiratory failure due to COVID-19 (HCC) 09/30/2019   AKI (acute kidney injury)    Diabetes mellitus without complication (HCC)    Hypertension    Nephrolithiasis    Pneumonia due to COVID-19 virus 09/30/2019   Sigmoid diverticulitis 09/04/2021     Allergies[1]  Medications Ordered Prior to Encounter[2]  Review of System: ROS Comprehensive ROS Pertinent positive and negative noted in HPI   Objective:  BP 135/86   Pulse 98   Temp 97.9 F (36.6 C)   Resp 16   Ht 5' 9 (1.753 m)   Wt (!) 334 lb 6.4 oz (151.7 kg)   SpO2 96%   BMI 49.38 kg/m   Filed Weights   03/17/24 1414  Weight: (!) 334 lb 6.4 oz (151.7 kg)    Physical Exam Vitals reviewed.  Constitutional:      Appearance: Normal appearance. He is obese.     Comments: Severe morbid obesity   HENT:     Head: Normocephalic.     Right Ear: Tympanic membrane and external ear normal.     Left Ear: Tympanic membrane and external ear normal.     Nose: Nose normal.  Eyes:     Extraocular Movements: Extraocular movements intact.     Pupils: Pupils are equal, round, and reactive to light.   Cardiovascular:     Rate and Rhythm: Normal rate and regular rhythm.  Pulmonary:     Effort: Pulmonary effort is normal.     Breath sounds: Normal breath sounds.  Abdominal:     General: Bowel sounds are normal. There is distension.     Palpations: Abdomen is soft.  Musculoskeletal:        General: Normal range of motion.  Skin:    General: Skin is warm and dry.  Neurological:     Mental Status: He is alert and oriented to person, place, and time.  Psychiatric:        Mood and Affect: Mood normal.        Behavior: Behavior normal.        Thought Content: Thought content normal.        Judgment: Judgment normal.      Assessment:  King was seen today for hospitalization follow-up.  Diagnoses and all orders for this visit:  OSA (obstructive sleep apnea) -     Ambulatory referral to Pulmonology  Morbid obesity (HCC) Sample of mounarjo   Uncontrolled type 2 diabetes mellitus with hyperglycemia, without long-term current use of insulin  (HCC)  Hypertension due to endocrine disorder  Hospital discharge follow-up    This note has been created with Dragon speech recognition software and paediatric nurse. Any transcriptional errors are unintentional.   No  follow-ups on file.  Rosaline SHAUNNA Bohr, NP 03/17/2024, 2:35 PM       [1]  Allergies Allergen Reactions   Shellfish Allergy Anaphylaxis, Shortness Of Breath, Swelling and Dermatitis   Shellfish Protein-Containing Drug Products Anaphylaxis, Shortness Of Breath and Dermatitis   Leek [Allium Porrum] Rash and Other (See Comments)    Facial became very red  [2]  Current Outpatient Medications on File Prior to Visit  Medication Sig Dispense Refill   Accu-Chek Softclix Lancets lancets Use to check blood sugar up to 4 times daily 100 each 0   amLODipine  (NORVASC ) 10 MG tablet Take 1 tablet (10 mg total) by mouth daily. 30 tablet 0   aspirin EC 81 MG tablet Take 162 mg by mouth once as needed (for chest pain-  Swallow whole).     atorvastatin  (LIPITOR) 80 MG tablet Take 1 tablet (80 mg total) by mouth daily. 30 tablet 0   Blood Glucose Monitoring Suppl (ACCU-CHEK GUIDE ME) w/Device KIT Use as directed to check blood sugar 1 kit 0   Glucose Blood (BLOOD GLUCOSE TEST STRIPS) STRP Use up to four times daily as directed. (FOR ICD-10 E10.9, E11.9). 100 each 0   insulin  glargine (LANTUS ) 100 UNIT/ML Solostar Pen Inject 20 Units into the skin at bedtime. May substitute as needed per insurance. 15 mL 0   insulin  lispro (HUMALOG ) 100 UNIT/ML KwikPen Inject 6 Units into the skin 3 (three) times daily with meals. Only take if eating a meal AND Blood Glucose (BG) is 80 or higher. 15 mL 0   Insulin  Pen Needle (PEN NEEDLES) 31G X 5 MM MISC 1 each by Does not apply route as directed. Dispense based on patient and insurance preference. Use up to four times daily as directed. (FOR ICD-10 E10.9, E11.9). 100 each 0   Lancet Device MISC 1 each by Does not apply route as directed. Dispense based on patient and insurance preference. Use up to four times daily as directed. (FOR ICD-10 E10.9, E11.9). 1 each 0   lisinopril  (ZESTRIL ) 20 MG tablet Take 1 tablet (20 mg total) by mouth daily. 30 tablet 0   No current facility-administered medications on file prior to visit.   "

## 2024-03-17 NOTE — Patient Instructions (Signed)
 Injections for Weight Loss (GLP-1s): What to Know If you're thinking about losing weight, you may have heard about GLP-1 medicines. These medicines can help people lose weight in a healthy way. Below, you'll learn about GLP-1 medicines, how they work, and what to expect. What are GLP-1 medicines? GLP-1 stands for glucagon-like peptide-1. It's a hormone your body makes naturally to help control blood sugar and digestion. GLP-1 medicines are man-made versions that work like this hormone. They help with weight loss and control blood sugar, especially for people with type 2 diabetes. Some common GLP-1 medicines are: Liraglutide. Semaglutide . Tirzepatide. Health care providers may prescribe GLP-1 medicines for people who are overweight or obese, especially if they also have diabetes or other health problems. These medicines are given as shots once a week or once a day, depending on the type. Your provider will start you on a low dose and slowly increase it. This helps reduce side effects. It also helps find the dose that works best for you. How do GLP-1 medicines help with weight loss? GLP-1s work by making your stomach empty food more slowly than normal. This makes you feel full for a longer time. They also lower your appetite and make your body release more insulin when your blood sugar is high. By making you feel full longer and less hungry, GLP-1 medicines help you eat less. This can lead to steady weight loss over time. Many people find it easier to lose weight when they use these medicines along with healthy habits. GLP-1s and diet  Even though GLP-1 medicines help reduce your appetite, it's still important to eat a healthy diet. Eating nutritious foods supports your weight loss and overall health. Here are some tips to help you eat well and get the best results: Eat plenty of fruits and vegetables every day. Choose whole grains like brown rice, oats, quinoa, and whole wheat bread. Eat plenty of  lean proteins such as chicken, fish, beans, or tofu. Drink water to stay hydrated. Limit sugary drinks, fried foods, and snacks high in fat or sugar. Eating smaller, balanced meals can also help you feel full and keep your energy steady. Side effects Like all medicines, GLP-1s can cause side effects. Most are mild and happen when you first start taking the medicine. Or they may happen just after you've increased your dose. The most common side effects include: Feeling like you may throw up. Throwing up. Watery poop, also called diarrhea. Trouble pooping (constipation). Heartburn. These side effects tend to get better over time as your body gets used to the medicine. Try eating smaller meals and avoiding alcohol and fatty or spicy foods to see if this helps. If the side effects get worse or don't go away, your provider might give you medicines to help you feel better or may need to lower your dose. Risks GLP-1 medicines aren't for everyone. Tell your provider if you've had thyroid  problems or pancreatitis, which is inflammation of the pancreas. Also tell them if you're pregnant or breastfeeding. Your provider can help you decide if this medicine is safe and right for you. This information is not intended to replace advice given to you by your health care provider. Make sure you discuss any questions you have with your health care provider. Document Revised: 10/21/2023 Document Reviewed: 09/09/2023 [employer] Patient Education  2025 [employer].

## 2024-03-26 ENCOUNTER — Ambulatory Visit: Payer: Self-pay | Admitting: Pharmacist

## 2024-04-07 ENCOUNTER — Ambulatory Visit (HOSPITAL_BASED_OUTPATIENT_CLINIC_OR_DEPARTMENT_OTHER): Payer: Self-pay

## 2024-04-15 ENCOUNTER — Ambulatory Visit (INDEPENDENT_AMBULATORY_CARE_PROVIDER_SITE_OTHER): Payer: Self-pay | Admitting: Primary Care

## 2024-06-16 ENCOUNTER — Ambulatory Visit (INDEPENDENT_AMBULATORY_CARE_PROVIDER_SITE_OTHER): Payer: Self-pay | Admitting: Primary Care
